# Patient Record
Sex: Female | Born: 1950 | Race: Black or African American | Hispanic: No | Marital: Single | Smoking: Former smoker
Health system: Southern US, Community
[De-identification: ages and names within clinical notes are randomized; demographics above are authoritative.]

## PROBLEM LIST (undated history)

## (undated) DIAGNOSIS — IMO0002 Reserved for concepts with insufficient information to code with codable children: Secondary | ICD-10-CM

## (undated) DIAGNOSIS — E118 Type 2 diabetes mellitus with unspecified complications: Secondary | ICD-10-CM

## (undated) DIAGNOSIS — J40 Bronchitis, not specified as acute or chronic: Secondary | ICD-10-CM

## (undated) DIAGNOSIS — J189 Pneumonia, unspecified organism: Secondary | ICD-10-CM

## (undated) DIAGNOSIS — I10 Essential (primary) hypertension: Secondary | ICD-10-CM

## (undated) DIAGNOSIS — R932 Abnormal findings on diagnostic imaging of liver and biliary tract: Secondary | ICD-10-CM

## (undated) DIAGNOSIS — I509 Heart failure, unspecified: Secondary | ICD-10-CM

## (undated) DIAGNOSIS — E1165 Type 2 diabetes mellitus with hyperglycemia: Secondary | ICD-10-CM

## (undated) HISTORY — DX: Type 2 diabetes mellitus with hyperglycemia: E11.65

## (undated) HISTORY — PX: CORONARY ARTERY BYPASS GRAFT: SHX141

## (undated) HISTORY — DX: Abnormal findings on diagnostic imaging of liver and biliary tract: R93.2

## (undated) HISTORY — PX: CARDIAC SURGERY: SHX584

## (undated) HISTORY — DX: Reserved for concepts with insufficient information to code with codable children: IMO0002

## (undated) HISTORY — DX: Type 2 diabetes mellitus with unspecified complications: E11.8

---

## 1998-01-10 ENCOUNTER — Emergency Department (HOSPITAL_COMMUNITY): Admission: EM | Admit: 1998-01-10 | Discharge: 1998-01-10 | Payer: Self-pay

## 1999-05-11 ENCOUNTER — Emergency Department (HOSPITAL_COMMUNITY): Admission: EM | Admit: 1999-05-11 | Discharge: 1999-05-11 | Payer: Self-pay | Admitting: Emergency Medicine

## 1999-08-12 ENCOUNTER — Encounter: Payer: Self-pay | Admitting: Family Medicine

## 1999-08-12 ENCOUNTER — Encounter: Admission: RE | Admit: 1999-08-12 | Discharge: 1999-08-12 | Payer: Self-pay | Admitting: Family Medicine

## 2000-09-15 ENCOUNTER — Encounter: Admission: RE | Admit: 2000-09-15 | Discharge: 2000-09-15 | Payer: Self-pay | Admitting: Family Medicine

## 2000-09-15 ENCOUNTER — Encounter: Payer: Self-pay | Admitting: Family Medicine

## 2002-04-17 ENCOUNTER — Encounter: Payer: Self-pay | Admitting: Emergency Medicine

## 2002-04-17 ENCOUNTER — Encounter: Admission: RE | Admit: 2002-04-17 | Discharge: 2002-04-17 | Payer: Self-pay | Admitting: Emergency Medicine

## 2003-03-18 ENCOUNTER — Encounter: Payer: Self-pay | Admitting: Cardiology

## 2003-03-18 ENCOUNTER — Inpatient Hospital Stay (HOSPITAL_COMMUNITY): Admission: EM | Admit: 2003-03-18 | Discharge: 2003-03-21 | Payer: Self-pay | Admitting: Cardiology

## 2003-03-18 ENCOUNTER — Encounter: Payer: Self-pay | Admitting: Emergency Medicine

## 2003-03-20 ENCOUNTER — Encounter: Payer: Self-pay | Admitting: Cardiology

## 2003-03-29 ENCOUNTER — Ambulatory Visit (HOSPITAL_COMMUNITY): Admission: RE | Admit: 2003-03-29 | Discharge: 2003-03-29 | Payer: Self-pay | Admitting: Cardiology

## 2003-03-29 ENCOUNTER — Encounter: Payer: Self-pay | Admitting: Cardiology

## 2003-06-27 ENCOUNTER — Encounter: Admission: RE | Admit: 2003-06-27 | Discharge: 2003-06-27 | Payer: Self-pay | Admitting: Cardiology

## 2004-04-15 ENCOUNTER — Encounter: Admission: RE | Admit: 2004-04-15 | Discharge: 2004-04-15 | Payer: Self-pay | Admitting: Internal Medicine

## 2005-01-30 ENCOUNTER — Inpatient Hospital Stay (HOSPITAL_COMMUNITY): Admission: AD | Admit: 2005-01-30 | Discharge: 2005-02-04 | Payer: Self-pay | Admitting: Cardiology

## 2005-01-30 ENCOUNTER — Encounter: Payer: Self-pay | Admitting: Emergency Medicine

## 2005-05-19 ENCOUNTER — Ambulatory Visit (HOSPITAL_COMMUNITY): Admission: RE | Admit: 2005-05-19 | Discharge: 2005-05-19 | Payer: Self-pay | Admitting: Cardiology

## 2005-06-22 ENCOUNTER — Encounter: Payer: Self-pay | Admitting: *Deleted

## 2005-06-23 ENCOUNTER — Inpatient Hospital Stay (HOSPITAL_COMMUNITY): Admission: EM | Admit: 2005-06-23 | Discharge: 2005-06-25 | Payer: Self-pay | Admitting: Cardiology

## 2005-06-24 ENCOUNTER — Encounter (INDEPENDENT_AMBULATORY_CARE_PROVIDER_SITE_OTHER): Payer: Self-pay | Admitting: Cardiology

## 2005-07-28 ENCOUNTER — Encounter: Payer: Self-pay | Admitting: *Deleted

## 2005-07-29 ENCOUNTER — Inpatient Hospital Stay (HOSPITAL_COMMUNITY): Admission: EM | Admit: 2005-07-29 | Discharge: 2005-07-30 | Payer: Self-pay | Admitting: Emergency Medicine

## 2005-11-08 ENCOUNTER — Emergency Department (HOSPITAL_COMMUNITY): Admission: EM | Admit: 2005-11-08 | Discharge: 2005-11-09 | Payer: Self-pay | Admitting: Emergency Medicine

## 2006-05-03 ENCOUNTER — Encounter: Admission: RE | Admit: 2006-05-03 | Discharge: 2006-05-03 | Payer: Self-pay | Admitting: Internal Medicine

## 2006-05-06 ENCOUNTER — Inpatient Hospital Stay (HOSPITAL_COMMUNITY): Admission: EM | Admit: 2006-05-06 | Discharge: 2006-05-17 | Payer: Self-pay | Admitting: Emergency Medicine

## 2006-05-10 ENCOUNTER — Encounter: Payer: Self-pay | Admitting: Vascular Surgery

## 2006-05-10 ENCOUNTER — Encounter (INDEPENDENT_AMBULATORY_CARE_PROVIDER_SITE_OTHER): Payer: Self-pay | Admitting: *Deleted

## 2006-06-10 ENCOUNTER — Encounter: Admission: RE | Admit: 2006-06-10 | Discharge: 2006-06-10 | Payer: Self-pay | Admitting: Cardiothoracic Surgery

## 2006-06-17 ENCOUNTER — Encounter (HOSPITAL_COMMUNITY): Admission: RE | Admit: 2006-06-17 | Discharge: 2006-09-15 | Payer: Self-pay | Admitting: Cardiology

## 2006-08-26 ENCOUNTER — Emergency Department (HOSPITAL_COMMUNITY): Admission: EM | Admit: 2006-08-26 | Discharge: 2006-08-26 | Payer: Self-pay | Admitting: Emergency Medicine

## 2006-09-08 ENCOUNTER — Emergency Department (HOSPITAL_COMMUNITY): Admission: EM | Admit: 2006-09-08 | Discharge: 2006-09-08 | Payer: Self-pay | Admitting: Emergency Medicine

## 2006-09-16 ENCOUNTER — Encounter (HOSPITAL_COMMUNITY): Admission: RE | Admit: 2006-09-16 | Discharge: 2006-12-15 | Payer: Self-pay | Admitting: Cardiology

## 2007-05-06 ENCOUNTER — Inpatient Hospital Stay (HOSPITAL_COMMUNITY): Admission: EM | Admit: 2007-05-06 | Discharge: 2007-05-10 | Payer: Self-pay | Admitting: Emergency Medicine

## 2007-05-06 ENCOUNTER — Encounter: Admission: RE | Admit: 2007-05-06 | Discharge: 2007-05-06 | Payer: Self-pay | Admitting: Internal Medicine

## 2007-05-09 ENCOUNTER — Encounter (INDEPENDENT_AMBULATORY_CARE_PROVIDER_SITE_OTHER): Payer: Self-pay | Admitting: Cardiology

## 2007-07-05 ENCOUNTER — Encounter: Admission: RE | Admit: 2007-07-05 | Discharge: 2007-07-05 | Payer: Self-pay | Admitting: Cardiology

## 2007-07-11 ENCOUNTER — Ambulatory Visit (HOSPITAL_COMMUNITY): Admission: RE | Admit: 2007-07-11 | Discharge: 2007-07-11 | Payer: Self-pay | Admitting: Cardiology

## 2007-07-20 ENCOUNTER — Encounter: Admission: RE | Admit: 2007-07-20 | Discharge: 2007-07-20 | Payer: Self-pay | Admitting: Thoracic Surgery

## 2007-07-20 ENCOUNTER — Ambulatory Visit: Payer: Self-pay | Admitting: Vascular Surgery

## 2008-01-26 ENCOUNTER — Encounter: Admission: RE | Admit: 2008-01-26 | Discharge: 2008-01-26 | Payer: Self-pay | Admitting: Cardiology

## 2008-02-07 ENCOUNTER — Ambulatory Visit (HOSPITAL_COMMUNITY): Admission: RE | Admit: 2008-02-07 | Discharge: 2008-02-08 | Payer: Self-pay | Admitting: Cardiology

## 2008-05-07 ENCOUNTER — Encounter: Admission: RE | Admit: 2008-05-07 | Discharge: 2008-05-07 | Payer: Self-pay | Admitting: Internal Medicine

## 2009-04-06 ENCOUNTER — Inpatient Hospital Stay (HOSPITAL_COMMUNITY): Admission: EM | Admit: 2009-04-06 | Discharge: 2009-04-07 | Payer: Self-pay | Admitting: Emergency Medicine

## 2009-06-07 ENCOUNTER — Encounter: Admission: RE | Admit: 2009-06-07 | Discharge: 2009-06-07 | Payer: Self-pay | Admitting: Internal Medicine

## 2009-10-06 ENCOUNTER — Emergency Department (HOSPITAL_COMMUNITY): Admission: EM | Admit: 2009-10-06 | Discharge: 2009-10-06 | Payer: Self-pay | Admitting: Emergency Medicine

## 2010-02-04 ENCOUNTER — Emergency Department (HOSPITAL_COMMUNITY): Admission: EM | Admit: 2010-02-04 | Discharge: 2010-02-05 | Payer: Self-pay | Admitting: Emergency Medicine

## 2010-02-24 ENCOUNTER — Encounter (INDEPENDENT_AMBULATORY_CARE_PROVIDER_SITE_OTHER): Payer: Self-pay | Admitting: Family Medicine

## 2010-02-24 ENCOUNTER — Ambulatory Visit: Payer: Self-pay | Admitting: Internal Medicine

## 2010-03-28 ENCOUNTER — Encounter (INDEPENDENT_AMBULATORY_CARE_PROVIDER_SITE_OTHER): Payer: Self-pay | Admitting: Family Medicine

## 2010-03-28 ENCOUNTER — Ambulatory Visit: Payer: Self-pay | Admitting: Internal Medicine

## 2010-03-28 LAB — CONVERTED CEMR LAB
Albumin: 4.5 g/dL (ref 3.5–5.2)
Alkaline Phosphatase: 68 units/L (ref 39–117)
BUN: 14 mg/dL (ref 6–23)
Calcium: 9.7 mg/dL (ref 8.4–10.5)
Cholesterol: 250 mg/dL — ABNORMAL HIGH (ref 0–200)
Glucose, Bld: 136 mg/dL — ABNORMAL HIGH (ref 70–99)
HDL: 32 mg/dL — ABNORMAL LOW (ref >39)
LDL Cholesterol: 178 mg/dL — ABNORMAL HIGH (ref 0–99)
Potassium: 4.2 meq/L (ref 3.5–5.3)
Triglycerides: 199 mg/dL — ABNORMAL HIGH (ref <150)

## 2010-07-15 ENCOUNTER — Ambulatory Visit (HOSPITAL_COMMUNITY)
Admission: RE | Admit: 2010-07-15 | Discharge: 2010-07-15 | Payer: Self-pay | Source: Home / Self Care | Admitting: Internal Medicine

## 2010-08-30 ENCOUNTER — Encounter: Payer: Self-pay | Admitting: Cardiology

## 2010-10-26 LAB — CBC
Hemoglobin: 12.1 g/dL (ref 12.0–15.0)
MCH: 30.4 pg (ref 26.0–34.0)
MCV: 90.5 fL (ref 78.0–100.0)
RBC: 3.99 MIL/uL (ref 3.87–5.11)

## 2010-10-26 LAB — POCT CARDIAC MARKERS
Myoglobin, poc: 78.1 ng/mL (ref 12–200)
Troponin i, poc: <0.05 ng/mL (ref 0.00–0.09)

## 2010-10-26 LAB — BASIC METABOLIC PANEL
CO2: 28 mEq/L (ref 19–32)
Chloride: 115 mEq/L — ABNORMAL HIGH (ref 96–112)
GFR calc Af Amer: 56 mL/min — ABNORMAL LOW (ref >60)
Potassium: 3.6 mEq/L (ref 3.5–5.1)
Sodium: 148 mEq/L — ABNORMAL HIGH (ref 135–145)

## 2010-10-26 LAB — DIFFERENTIAL
Eosinophils Absolute: 0.3 10*3/uL (ref 0.0–0.7)
Eosinophils Relative: 3 % (ref 0–5)
Lymphs Abs: 2.5 10*3/uL (ref 0.7–4.0)
Monocytes Relative: 10 % (ref 3–12)

## 2010-10-29 LAB — BASIC METABOLIC PANEL
Chloride: 106 mEq/L (ref 96–112)
Creatinine, Ser: 1.07 mg/dL (ref 0.4–1.2)
GFR calc Af Amer: >60 mL/min (ref >60)
GFR calc non Af Amer: 52 mL/min — ABNORMAL LOW (ref >60)
Potassium: 3.6 mEq/L (ref 3.5–5.1)

## 2010-10-29 LAB — DIFFERENTIAL
Eosinophils Absolute: 0.2 10*3/uL (ref 0.0–0.7)
Lymphocytes Relative: 19 % (ref 12–46)
Lymphs Abs: 1.5 10*3/uL (ref 0.7–4.0)
Monocytes Relative: 11 % (ref 3–12)
Neutrophils Relative %: 68 % (ref 43–77)

## 2010-10-29 LAB — BRAIN NATRIURETIC PEPTIDE: Pro B Natriuretic peptide (BNP): 409 pg/mL — ABNORMAL HIGH (ref 0.0–100.0)

## 2010-10-29 LAB — POCT CARDIAC MARKERS
CKMB, poc: 1.6 ng/mL (ref 1.0–8.0)
Myoglobin, poc: 144 ng/mL (ref 12–200)
Troponin i, poc: <0.05 ng/mL (ref 0.00–0.09)

## 2010-10-29 LAB — CBC
MCV: 89.2 fL (ref 78.0–100.0)
RBC: 4.08 MIL/uL (ref 3.87–5.11)
WBC: 8 10*3/uL (ref 4.0–10.5)

## 2010-11-15 LAB — CARDIAC PANEL(CRET KIN+CKTOT+MB+TROPI)
CK, MB: 2.1 ng/mL (ref 0.3–4.0)
CK, MB: 2.4 ng/mL (ref 0.3–4.0)
Relative Index: INVALID (ref 0.0–2.5)
Total CK: 77 U/L (ref 7–177)
Troponin I: 0.03 ng/mL (ref 0.00–0.06)

## 2010-11-15 LAB — DIFFERENTIAL
Basophils Absolute: 0 10*3/uL (ref 0.0–0.1)
Lymphocytes Relative: 21 % (ref 12–46)
Lymphs Abs: 1.7 10*3/uL (ref 0.7–4.0)
Neutro Abs: 5.2 10*3/uL (ref 1.7–7.7)
Neutrophils Relative %: 66 % (ref 43–77)

## 2010-11-15 LAB — BASIC METABOLIC PANEL
BUN: 15 mg/dL (ref 6–23)
CO2: 27 mEq/L (ref 19–32)
Calcium: 9.7 mg/dL (ref 8.4–10.5)
Chloride: 105 mEq/L (ref 96–112)
Creatinine, Ser: 0.94 mg/dL (ref 0.4–1.2)
GFR calc Af Amer: >60 mL/min (ref >60)
Glucose, Bld: 144 mg/dL — ABNORMAL HIGH (ref 70–99)

## 2010-11-15 LAB — BRAIN NATRIURETIC PEPTIDE
Pro B Natriuretic peptide (BNP): 148 pg/mL — ABNORMAL HIGH (ref 0.0–100.0)
Pro B Natriuretic peptide (BNP): 254 pg/mL — ABNORMAL HIGH (ref 0.0–100.0)

## 2010-11-15 LAB — POCT CARDIAC MARKERS
Myoglobin, poc: 66.5 ng/mL (ref 12–200)
Troponin i, poc: <0.05 ng/mL (ref 0.00–0.09)

## 2010-11-15 LAB — GLUCOSE, CAPILLARY
Glucose-Capillary: 157 mg/dL — ABNORMAL HIGH (ref 70–99)
Glucose-Capillary: 71 mg/dL (ref 70–99)

## 2010-11-15 LAB — TROPONIN I: Troponin I: 0.03 ng/mL (ref 0.00–0.06)

## 2010-11-15 LAB — D-DIMER, QUANTITATIVE: D-Dimer, Quant: 0.75 ug/mL-FEU — ABNORMAL HIGH (ref 0.00–0.48)

## 2010-11-15 LAB — CBC
HCT: 35.1 % — ABNORMAL LOW (ref 36.0–46.0)
MCHC: 33.3 g/dL (ref 30.0–36.0)
MCV: 90.3 fL (ref 78.0–100.0)
Platelets: 198 10*3/uL (ref 150–400)
Platelets: 215 10*3/uL (ref 150–400)
RDW: 14.1 % (ref 11.5–15.5)
RDW: 14.3 % (ref 11.5–15.5)
WBC: 7.9 10*3/uL (ref 4.0–10.5)

## 2010-11-15 LAB — POCT I-STAT, CHEM 8
BUN: 13 mg/dL (ref 6–23)
Calcium, Ion: 1.11 mmol/L — ABNORMAL LOW (ref 1.12–1.32)
Chloride: 107 mEq/L (ref 96–112)
HCT: 36 % (ref 36.0–46.0)
Sodium: 140 mEq/L (ref 135–145)

## 2010-11-15 LAB — CK TOTAL AND CKMB (NOT AT ARMC): Relative Index: INVALID (ref 0.0–2.5)

## 2010-12-23 NOTE — Cardiovascular Report (Signed)
NAME:  Rachel Oneill, Rachel Oneill               ACCOUNT NO.:  [id number]   MEDICAL RECORD NO.:  [id number]          PATIENT TYPE:  AMB   LOCATION:  SDS                          FACILITY:  [facility]   PHYSICIAN:  Vonna Kotyk R. Jacinto Halim, MD       DATE OF BIRTH:  Jan 15, 1951   DATE OF PROCEDURE:  07/11/2007  DATE OF DISCHARGE:                            CARDIAC CATHETERIZATION   PROCEDURE PERFORMED:  1. Abdominal aortogram.  2. Distal abdominal aortogram with bifemoral runoff.  3. Right femoral and right iliac arteriogram.  4. Closure of the arterial access on both sides using StarClose.   INDICATIONS:  [name] is a 60 year old female with known  coronary artery disease who has undergone bypass surgery in the past  along with mitral valve repair in October 2007.  She had 1-vessel bypass  graft to the right coronary artery.  She has had multiple episodes of  recurrent pulmonary edema.  In spite of mitral valve repair, I had been  unable to find out the etiology for pulmonary edema and she appears to  be compliant with both medications and diet.  She recently underwent  lower extremity arterial Dopplers which had revealed high-grade stenosis  of the right common iliac artery and a high-grade stenosis of the left  SFA.  Because of symptoms of claudication, I brought her to the  peripheral angiography suite for peripheral arterial disease diagnosis  and management.   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  There is swirling of the contrast  in spite of a wait time of approximately 4-5 minutes.  The right renal  artery was excluded from the aneurysm and arose superiorly.  However,  the left renal artery arises in the mid segment of the aneurysmal sac.   The aortoiliac bifurcation was patent.  However, the right common iliac  artery showed a high-grade 90-95%  stenosis at the bifurcation of the  internal and common iliac artery.  The internal iliac artery also had a  95-99% stenosis.   The left common iliac artery in the mid segment had a 40-50% stenosis  with a 15 mm pressure gradient.   Abdominal aortogram with bifemoral runoff:  The abdominal aortogram with  bifemoral runoff revealed the femoral arteries to be widely patent.  The  left SFA in the mid-to-distal segment had a 95% stenosis followed by a 2-  vessel runoff below the left knee.  The anterior tibial was occluded and  reconstitutes at the level of the ankle joint.   Right lower extremity:  Right SFA in the mid segment had a 40-50%  stenosis and there was 3-vessel runoff noted below the right knee.   IMPRESSION:  High-grade 90% stenosis of the right common iliac and  internal iliac artery stenosis at the bifurcation.  Moderate stenosis of  the left common iliac artery with a 15 mm pressure gradient.   A 95% stenosis of the left distal superficial femoral artery and a 50%  stenosis of the right  mid superficial femoral artery.  Three-vessel  runoff on the right and 2-vessel runoff on the left with occluded left  anterior tibial artery below the knee.   RECOMMENDATIONS:  I discussed the findings with Dr. Fabienne Bruns.  Given the fact that she has had recurrent pulmonary edema, I suspect  although there is no renal artery stenosis by cardiac catheterization on  selective engagement of the renal artery in September 2008 because of  recurrent pulmonary edema, I suspect the involvement of the renal artery  with the aneurysm could be causing her to have steal or underperfusion  of the renal arteries leading to physiologic renal artery stenosis.  Hence, she may need abdominal aneurysm repair along with reimplantation  of the renal arteries.   A formal CT scan of the abdomen will be performed to evaluate her  peripheral anatomy.   Approximately 140 mL of contrast was utilized for  diagnostic  angiography.   TECHNIQUE OF THE PROCEDURE:  Under usual sterile precautions, using a 5-  French left femoral arterial access I was able to advance a marker  pigtail catheter into the abdominal aorta and abdominal aortogram was  performed in the AP and also in the left lateral projection.  The distal  abdominal aortogram with distal bilateral femoral runoff was performed.  Then using the pigtail catheter, I was able to cross the [facility] wire  into the right common iliac artery and placed the wire into the common  femoral artery on the right.  Then I was able to obtain access through  the right groin using the wire as a support.  This was done with the  intention of angioplasty of the left SFA and also angioplasty of the  right common iliac artery.   I advanced a 7-French  Terumo sheath into the right common femoral  artery into the external iliac artery and angiography was performed.  The angiography in the left anterior oblique view revealed a high-grade  stenosis of the iliac arterial system at the bifurcation on the right.  Given this, I decided to stop the procedure and get surgical opinion.  Then I was able to close both the right and left groin access sites with  StarClose with excellent hemostasis.  The patient tolerated the  procedure.  No immediate complications were noted.      Cristy Hilts. Jacinto Halim, MD  Electronically Signed     JRG/MEDQ  D:  07/11/2007  T:  07/11/2007  Job:  [id number]   cc:   Gaspar Garbe, M.D.  Darrelyn Hillock

## 2010-12-23 NOTE — Discharge Summary (Signed)
NAME:  Rachel, Oneill NO.:  [id number]   MEDICAL RECORD NO.:  [id number]          PATIENT TYPE:  INP   LOCATION:  4705                         FACILITY:  [facility]   PHYSICIAN:  Gaspar Garbe, M.D.DATE OF BIRTH:  08-13-1950   DATE OF ADMISSION:  04/05/2009  DATE OF DISCHARGE:  04/07/2009                               DISCHARGE SUMMARY   FINAL DIAGNOSES:  1. Congestive heart failure, mild.  2. Chronic obstructive pulmonary disease exacerbation.  3. Coronary artery disease.  4. Peripheral vascular disease.  5. Hypertension.  6. Hyperlipidemia.  7. Nodules, subcentimeter in chest.  Prior CAT scan of chest with      lymphadenopathy, which has remained unchanged.   MEDICATIONS ON DISCHARGE:  1. Aspirin 81 mg per day.  2. Plavix 75 mg per day.  3. Lipitor 80 mg per day.  4. Zetia 10 mg per day.  5. Prinivil 40 mg once daily.  6. Lasix 40 mg once daily.  7. Glipizide 5 mg once daily.  8. Flovent HFA 110, 2 puffs twice daily.  9. Albuterol HFA 2 puffs q.6 h. p.r.n. shortness of breath.   RADIOLOGY:  1. Chest x-ray performed on April 07, 2009, showed resolving      pulmonary edema.  2. CT angio and chest on April 06, 2009, shows no pulmonary emboli,      acute congestive heart failure, and interval multiple bilateral      noncalcified lung nodules, which could represent noncalcified      granuloma or lung metastasis, stable mediastinal bilateral hilar      adenopathies noted as well as cardiomegaly.   LABORATORY TESTS:  BNP on date of discharge 148, was 254 on admission.  BMET shows sodium 141, potassium 3.5, BUN and creatinine 15 and 0.9  respectively.  CBC shows white count 8.3, hemoglobin 12.4, hematocrit  37.4, platelets 215.  Cardiac enzymes remained negative.  D-dimer was  slightly elevated at admission at 0.75.   HOSPITAL COURSE:  The patient was admitted through the emergency room  for shortness of breath.  She had undergone workup, which was  negative  for PE and evidence of acute CHF with a slightly elevated BNP.  The  patient had Lasix ordered and responded reasonably well to this.  The  patient does have a long-term tobacco usage history, and she was  initiated on Flovent as well.  By the date of discharge, she felt like  she was considerably better.  Consultation was undertaken by Dr. Tresa Endo,  who took her off her Prinzide and put her on plain Prinivil at higher  dose of 40 mg and Lasix 40 mg a day.  The patient is being discharged  home in good condition.   FOLLOWUP:  The patient is to follow up with Dr. Tresa Endo with appointment,  which she believes is a standing appointment on April 18, 2009.  He  will consider echocardiogram as an outpatient, as this could not [facility]  [facility].  She will follow up with me per  her routine.   PHYSICAL EXAMINATION  ON THE DATE OF DISCHARGE:  VITAL SIGNS:  Temperature 97.6, pulse 69, respiratory rate 16, blood pressure 113/62,  sating 92% on room air.  GENERAL:  No acute distress.  HEENT:  Normocephalic, atraumatic.  PERRLA.  EOMI.  ENT is within normal  limits.  NECK:  Supple.  No lymphadenopathy, JVD, or bruit.  CARDIAC:  Regular rate and rhythm.  No murmur, rub, or gallop.  LUNGS:  Clear to auscultation bilaterally.  ABDOMEN:  Soft, nontender, normoactive bowel sounds.  EXTREMITIES:  No clubbing, cyanosis, or edema.   CONDITION ON DISCHARGE:  Stable.      Gaspar Garbe, M.D.  Electronically Signed     RWT/MEDQ  D:  04/07/2009  T:  04/07/2009  Job:  [id number]   cc:   Nicki Gu[place], M.D.

## 2010-12-23 NOTE — Cardiovascular Report (Signed)
NAME:  Rachel Oneill, Rachel Oneill NO.:  [id number]   MEDICAL RECORD NO.:  [id number]          PATIENT TYPE:  OIB   LOCATION:  2626                         FACILITY:  [facility]   PHYSICIAN:  Cristy Hilts. Jacinto Halim, MD       DATE OF BIRTH:  03/10/1951   DATE OF PROCEDURE:  02/07/2008  DATE OF DISCHARGE:                            CARDIAC CATHETERIZATION   PROCEDURES PERFORMED:  1. Abdominal aortogram.  2. Pelvic arteriogram.  3. Right and left iliac arteriogram.  4. Abdominal aortogram with bifemoral runoff.  5. Stenting of the right common iliac artery and external iliac artery      bifurcation.  6. Stenting of the left common and external iliac artery bifurcation.   INDICATIONS:  Rachel Oneill is a 60 year old African American female  with known coronary disease and peripheral arterial disease.  She has  been complaining of lifestyle-limiting claudication.  She had abnormal  Dopplers revealing high-grade stenosis of the right common iliac artery  and also high-grade stenosis of the left superficial femoral artery.  Hence, she was brought to the catheterization lab for evaluation of her  peripheral anatomy.  She also has a known abdominal aortic aneurysm  which is small to moderate-sized and has been closely watched.  The ABI  on the right was 0.79 and left was 0.78.  There was also a 50% stenosis  noted in the left common iliac artery with Dopplers and high-grade  stenosis of the right common iliac artery.   Abdominal aortogram:  Abdominal aortogram revealed presence of small to  moderate-sized abdominal aortic aneurysm.  This involved the left renal  artery.  By historically, it measures 3.3 cm x 3.2 cm.  This is  unchanged from prior catheterizations.   The aortoiliac bifurcation was patent widely, however, there was a  suggestion of high-grade stenosis of the left common iliac artery and  also right common iliac artery at the bifurcation of the internal and  external iliac  artery.   Iliac arteriogram:  Iliac arteriogram did confirm very high-grade  stenosis of the right iliac artery stenosis and moderate stenosis of the  left common iliac artery.   The bilateral iliac arteriogram was also performed.   The abdominal aortogram with bifemoral runoff revealed a high-grade 90%  stenosis of the left superficial femoral artery.   Below the left knee, the anterior tibial artery was occluded and is  collateralized by the other 2 vessels at the level of the ankle.   On the right side, there is a 50% stenosis of the right superficial  femoral artery, but otherwise 3-vessel runoff is noted below the right  knee.  The anterior tibial artery showed tandem 90% stenosis, however,  there was brisk flow noted.   INTERVENTION DATA:  Extremely difficult to access and also difficult to  navigate through the right common iliac artery stenosis.  Because of  nonpalpable pulses, ultrasound-guided access was obtained.  Successful  PTA and stenting of the right common external iliac artery stenosis with  implantation of a 10.0 x 40-mm EV3 Protege stent which was postdilated  with  a 6.0 x 20-mm FoxCross balloon.  Overall, the stenosis was reduced  from 95% to 0% with brisk flow.  The internal iliac artery which has a  ostial 70% stenosis maintained its patency.   Successful PTA and stenting of the left common iliac artery protruding  into the left external iliac artery with implantation of a 10.0 x 30 mm  Protege stent and postdilated with 8.0 x 20-mm Powerflex balloon.  Stenosis was reduced from 80% to 0% with brisk flow noted.   RECOMMENDATIONS:  Given the fact that she has high-grade stenosis of the  left superficial femoral artery, she will need elective PTA of the left  superficial femoral artery.  This will be performed in 6-8 weeks  following this procedure, so that the stents can be endothelialized.  As  far as the abdominal aortic aneurysm is concerned, this has  remained  stable.  We will continue to monitor this by ultrasound as an outpatient  basis.   A total of 160-175 mL of contrast was utilized for diagnostic and  interventional procedure.   TECHNICAL PROCEDURE:  Using ultrasound guidance, 5-French right femoral  arterial access was obtained with great difficulty.  Right iliac  arteriogram was performed.  This revealed a significant high-grade  stenosis of the left common iliac artery.  Hence, a left groin access  was also obtained.   A 5-French sheath was introduced into the left common femoral artery.   Left iliac arteriogram was performed.   A 5-French Omniflush catheter was advanced into the abdominal aorta and  abdominal aortogram was performed.  The catheter was pulled back into  the distal abdominal and pelvic aortogram was performed.  Then abdominal  aortogram with bifemoral runoff was also performed.   TECHNIQUE OF INTERVENTION:  Using heparin for anticoagulation  maintaining ACT greater than 200, a 7-French Terumo bright tip sheath  was advanced up to the external iliac artery.  Using a H1 glide tip  catheter, I was able to advance through the common iliac artery stenosis  with great difficulty using a Glidewire and I was able to cross through  this and placed the wire into the abdominal aorta and the Headhunter was  placed through the distal abdominal aorta.  The Glidewire was removed  and a Wholey wire was placed into the abdominal aorta.  Then using a 6.0  x 20 mm balloon, balloon angioplasty was performed.  There was  dissection noted in the common iliac artery and also patency of the  external iliac artery was maintained.  Prior to performing balloon  angioplasty, the Omniflush balloon was utilized to place a Wholey wire  into the right internal iliac artery crossing it from the left side and  placed into the internal iliac artery to maintain the patency.  Having  performed balloon angioplasty and dissection noted into  the common iliac  artery, I decided to place a stent and a 10.0 x 40 mm Prodigy stent was  placed and deployed and postdilated the same with 6.0 x 20 mm FoxCross  balloon.  Excellent results were noted.   The attention was directed towards the left common iliac artery  stenosis.  I performed a careful pullback after giving 200 mcg of intra-  arterial nitroglycerin and this revealed about a 20-25 mm pressure  gradient across the stenosis.  Hence, I proceeded with stenting of the  same with a 10.0 x 30 mm self-expanding Prodigy stent which was  appropriately deployed at the ostium of  the left common iliac artery and  covering the stenosis.  This was postdilated with a 8.0 x 20 mm  Powerflex balloon at 6 atmospheric pressure for 30 seconds and again at  2 atmospheric pressure for 15 seconds and performed this angiography.  It revealed excellent results.  The Omniflush catheter was pushed  back into the distal abdominal aorta and distal abdominal aortogram was  performed and results were confirmed.  The catheters were pulled out  over the soft wire and sheaths were sutured in place.  The patient  tolerated the procedure well.  No immediate complications were noted.      Cristy Hilts. Jacinto Halim, MD  Electronically Signed     JRG/MEDQ  D:  02/07/2008  T:  02/08/2008  Job:  [id number]   cc:   Gaspar Garbe, M.D.

## 2010-12-23 NOTE — Discharge Summary (Signed)
NAME:  Rachel Oneill, ABER NO.:  [id number]   MEDICAL RECORD NO.:  [id number]          PATIENT TYPE:  INP   LOCATION:  4739                         FACILITY:  [facility]   PHYSICIAN:  Cristy Hilts. Jacinto Halim, MD       DATE OF BIRTH:  [birth year]   DATE OF ADMISSION:  05/06/2007  DATE OF DISCHARGE:  05/10/2007                               DISCHARGE SUMMARY   FINAL DISCHARGE DIAGNOSES:  1. Non-ST elevation myocardial infarction.  2. Known coronary artery disease status post coronary artery bypass      graft surgery (CABG) as well as mitral valve repair.  3. Cardiac catheterization this admission revealing patent sepsis vein      graft to the right coronary artery as well as patent graft to the      circumflex obtuse marginal.  4. Mildly decreased left ventricular (LV) function with ejection      fraction of 40%.  5. Hypertension.  6. Congestive heart failure, acute on chronic.  7. Dyslipidemia.  8. Abnormal electrocardiogram .   PROCEDURE:  Left heart catheterization and coronary arteriogram  performed on May 09, 2007, by Dr. Susa Griffins revealing a  patent sepsis vein graft to obtuse marginal branch as well as patent  saphenous vein graft to the PDA.  Ejection fraction was estimated at 35-  40% without mitral regurgitation.  There were no complications during  the cardiac cath.   BRIEF HISTORY:  Rachel Oneill is a pleasant 60 year old African American  female with a history of known coronary artery disease status post CABG  as well as mitral valve repair with mildly decreased LV function by echo  in March 2008.  She has a history of recurrent pulmonary edema secondary  to medication noncompliance as well as dietary noncompliance.  She  presented to [facility] emergency department with shortness of breath.  On arrival, blood pressure was 214/125.  On further questioning, she had  eaten [place] food earlier in the day as well as fried chicken earlier  throughout the  week.  Please see written H&P for further details.   HOSPITAL COURSE:  Ms. Finnie was admitted to telemetry in stable  condition.  Her blood pressure had improved down to 138/86.  Cardiac  markers were drawn and initially returned a CK of 128, CK-MB of 4.5 and  troponin of 0.08.  D-dimer was also abnormal at 1.0.  On admission, her  EKG revealed normal sinus rhythm, sinus tach with nonspecific ST-T wave  changes.  However, once the patient arrived to the floor, EKG was  repeated, and there were clearly new T-wave inversions in 1, AVL and V6  with no ST elevation.  Heparin was initiated, and a CT of the chest was  ordered to evaluate for PE, and she was transferred to step down unit.  CT of the chest revealed no evidence for pulmonary embolism.  There was  enlargement of the left main pulmonary artery stenosis of underlying  pulmonary hypertension and no other acute findings.  Has a very abnormal  EKG which shows positive cardiac markers.  She underwent  left heart  catheterization and coronary arteriogram on May 09, 2007 with  findings as listed above.  She was transferred to telemetry post  procedure, and on the day of discharge, she was doing quite well and was  anxious for discharge home.  She was ambulating in the hallway without  difficulty, and her congestive heart failure had resolved.  Her lungs  were clear on physical exam.  There were no murmurs or gallops, and the  right groin site was stable without hematoma or bleeding.  Her blood  pressure was 93/61.  She was deemed ready for discharge.   DISCHARGE INSTRUCTIONS:  1. Diet: She is to follow a heart healthy low-fat, low-sodium diet.      She is to weigh herself daily and contact us with a 3 pound weight      gain in 24 hours or 5 in one week.  2. Wound care: She is to keep her groin site clean and dry and to      contact our office with any redness, swelling or drainage from the      site.  3. Activity: She may increase  her activity slowly.  She may shower      pain.  She is to avoid heavy lifting for the next week and no      driving for the next 24 hours.  4. Follow up: She is to follow up with Dr. Jacinto Halim.  Our office will      contact her with dates and times for her appointment.   DISCHARGE MEDICATIONS:  1. Plavix 75 mg daily.  2. Aspirin 81 mg daily.  3. Lipitor 80 mg daily.  4. Lisinopril/hydrochlorothiazide 20/12.5 mg daily.  5. Coreg 6.25 mg b.i.d.  6. Norvasc 5 mg daily.  7. She is to discontinue her metoprolol.     ______________________________  Charmian Muff, NP      Cristy Hilts. Jacinto Halim, MD  Electronically Signed    LS/MEDQ  D:  07/02/2007  T:  07/02/2007  Job:  [id number]   cc:   [facility]

## 2010-12-23 NOTE — H&P (Signed)
NAME:  Rachel Oneill, WOODRING NO.:  [id number]   MEDICAL RECORD NO.:  [id number]          PATIENT TYPE:  INP   LOCATION:  4705                         FACILITY:  [facility]   PHYSICIAN:  Gaspar Garbe, M.D.DATE OF BIRTH:  12-15-1950   DATE OF ADMISSION:  04/05/2009  DATE OF DISCHARGE:                              HISTORY & PHYSICAL   CHIEF COMPLAINT:  Shortness of breath.   HISTORY OF PRESENT ILLNESS:  The patient is a 60 year old black female  with a history of coronary artery disease, mitral valve repair, and a  long-term smoking history from which she has quit about 4 years ago.  She also has peripheral vascular disease with common iliac stent.  The  patient indicated that she felt like she was catching a cold on Monday,  took Crocin and did reasonably well, feeling better by Wednesday, by  Thursday she was having more shortness of breath and by Friday, the day  prior to her admission she was having some difficulty in getting around  without getting short of breath and could not go to the laundromat  because it was too difficult for her to walk the distance.  She then  came to the emergency room where she was evaluated, undergoing spiral CT  to rule out PE because of a mildly elevated D-dimer, cardiac enzymes  which were negative, and chest x-ray and EKG which showed possibility of  pulmonary vascular congestion.  I was asked to admit the patient to the  hospital.  Of note, the patient has responded reasonably well to IV  Lasix as ordered.   ALLERGIES:  No known drug allergies.   MEDICATIONS:  1. Aspirin 81 mg per day.  2. Plavix 75 mg per day.  3. Lipitor 80 mg per day.  4. Zetia 10 mg per day.  5. Prinzide 20/12.5 mg per day.  6. Glipizide 5 mg per day.   PAST MEDICAL HISTORY:  1. Coronary artery disease status post CABG and mitral valve repair in      2008.  2. Diabetes mellitus type 2, well controlled.  3. Peripheral vascular disease status post common  iliac stents.  4. History of pulmonary edema and CHF with ejection fraction known of      35% to 40%.  5. History of a small AAA.  6. Hypertension.  7. Hyperlipidemia.   PAST SURGICAL HISTORY:  As above.   SOCIAL HISTORY:  The patient lives in [place] with her husband.  She  is a collections agent.  Married with 3 children.  She is 20-pack-year  smoking history and quit in about 2006, does not drink alcohol.   FAMILY HISTORY:  Noncontributory.   REVIEW OF SYSTEMS:  The patient denies any fevers, chills, or sweats.  Indicates that she has had some nasal discharge previously and notes  shortness of breath, dyspnea on exertion, and cough and has also  gurgling in her lungs.  Denies any urinary system.  Denies any chest  pain.  Denies any musculoskeletal symptoms or GI symptoms.  Fourteen-  point review of systems is otherwise negative.  PHYSICAL EXAMINATION:  VITAL SIGNS:  Temperature 98, pulse 95,  respiratory rate 15, blood pressure 133/84, sating 95% on room air.  GENERAL:  No acute distress.  HEENT:  Normocephalic, atraumatic.  PERRLA.  EOMI.  ENT is within normal  limits.  NECK:  Supple.  No lymphadenopathy, JVD, or bruit.  HEART:  Regular rate and rhythm.  LUNGS:  The patient has slight inspiratory crackles.  No expiratory  wheezes are noted.  ABDOMEN:  Soft, nontender, normoactive bowel sounds.  No  hepatosplenomegaly.  EXTREMITIES:  No clubbing, cyanosis, or edema are noted.  MUSCULOSKELETAL:  No joint deformities.  NEURO:  The patient is oriented to person, place, and time and has 5/5  strength.   Chest x-ray shows pulmonary vascular congestion, but no consolidation.  CT shows negative for pulmonary embolism, positive for congestive heart  failure with hilar lymphadenopathy, and multiple noncalcified lung  nodules that appear new as compared to CT from 2 years ago.  This needs  to be followed up in 3 months.   LABORATORY DATA:  White count 7.26, hemoglobin 11.7,  hematocrit 35.1,  platelets 198.  BUN and creatinine 13 and 0.9 respectively.  CK 95, MD  3.1, troponin less than 0.05.  D-dimer was slightly elevated at 0.75 and  her BNP is 254.   ASSESSMENT/PLAN:  1. Shortness of breath.  This is probably a mixed picture of      congestive heart failure, possibly any type of COPD or respiratory      illness.  For congestive heart failure, I have added 40 mg of IV      Lasix.  We will follow her BMPs and check an echo in the morning.      I have asked Dr. Tresa Endo with [facility] to see      her.  I believe she is to see Dr. Jacinto Halim in the past, I am not      certain who she is assigned to at this point.  2. Chronic obstructive pulmonary disease.  Her longstanding      respiratory history, this is likely concomitant issue.  We will      start her on inhaled corticosteroids and Xopenex nebs as needed.      She will most likely go home with albuterol nebulizers and inhaled      corticosteroid with outpatient PFTs at a later date as these are      difficult to obtain on the weekend.  I will also cover her with IV      Rocephin while she is in-house.  Given her multiple concomitant      illnesses, respiratory, and cardiovascular disease, she is at high      risk for exacerbation.  3. Hypertension, currently controlled.  4. Hyperlipidemia, max dose of Lipitor plus Zetia.  5. Coronary artery disease.  We will rule her out by enzymes as this      is most likely not myocardial infarction.  6. Pulmonary nodules.  These are subcentimeter, will need to be      followed in 3 months by CT and could be a sequela of her current      illness.  The lymphadenopathy, which have been noted 2 years ago      has not changed in size per radiology's read.      Gaspar Garbe, M.D.  Electronically Signed     RWT/MEDQ  D:  04/06/2009  T:  04/06/2009  Job:  [id number]

## 2010-12-23 NOTE — Discharge Summary (Signed)
NAME:  Rachel Oneill, EVOLA NO.:  [id number]   MEDICAL RECORD NO.:  [id number]          PATIENT TYPE:  OIB   LOCATION:  2626                         FACILITY:  [facility]   PHYSICIAN:  Cristy Hilts. Jacinto Halim, MD       DATE OF BIRTH:  July 22, 1951   DATE OF ADMISSION:  02/07/2008  DATE OF DISCHARGE:  02/08/2008                               DISCHARGE SUMMARY   DISCHARGE DIAGNOSES:  1. Peripheral vascular disease, status post bilateral common iliac      arteries intervention during this admission by Dr. Jacinto Halim.  2. Recurrent pulmonary edema, currently stable.  3. Known coronary artery disease, status post coronary artery bypass      graft in 2007 with saphenous vein graft to the posterior descending      artery and saphenous vein graft to the obtuse marginal 1.  4. Status post mitral valve repair at the time of open heart surgery.  5. Known abdominal aortic aneurysm, small.   This is a 60 year old female, patient of Dr. Jacinto Halim, who was seen in the  office with complaints of hip claudication.  The patient underwent  peripheral vascular Dopplers and ultrasound in our office, which showed  abnormal resolved and her further evaluation of this problem; the  patient was scheduled for peripheral vascular angiogram.  She presented  to [facility] on February 07, 2008, for this procedure and  underwent the same day.  Dr. Jacinto Halim has performed angiography and the  results revealed bilateral common iliac artery stenosis with bilateral  SFA stenosis to both left greater than the right.  She also had small-to-  moderate size abdominal aortic aneurysm with patent renal arteries.   Dr. Jacinto Halim performed stenting of bilateral common iliacs with good result  and the patient will have to return for stage elective SFA angioplasty.   The next morning, she was seen by Dr. Alanda Amass, considered to be stable  for discharge home.  Her laboratories were stable.  Sodium 139,  potassium 3.7, BUN 10, and  creatinine 0.92.  Hemoglobin 11.6 and  hematocrit 33.9.   The patient will be discharged home on the following medications.  1. Plavix 75 mg daily.  2. Lopressor 25 mg b.i.d.  3. Lisinopril/hydrochlorothiazide 20/12.5 mg daily.  4. Aspirin 81 mg daily.  5. Lipitor 80 mg daily.  6. Zetia 10 mg daily.  7. Glipizide 2.5 mg daily.   DISCHARGE INSTRUCTIONS:  The patient was instructed not to drive or lift  weight greater than 5 pounds for 3 days post cath.  Increase activity  slowly.  If worsening in signs of oozing or bleeding from groin puncture  site, call our office.   DISCHARGE FOLLOWUP:  Office will contact the patient to schedule  ultrasound of lower extremity, sent followup appointment with Dr. Jacinto Halim  within 2 weeks.      Raymon Mutton, P.A.       ______________________________  Cristy Hilts Jacinto Halim, MD    MK/MEDQ  D:  02/08/2008  T:  02/08/2008  Job:  [id number]   cc:   Gaspar Garbe, M.D.

## 2010-12-23 NOTE — Cardiovascular Report (Signed)
NAME:  Rachel Oneill, Rachel Oneill               ACCOUNT NO.:  [id number]   MEDICAL RECORD NO.:  [id number]          PATIENT TYPE:  INP   LOCATION:  4739                         FACILITY:  [facility]   PHYSICIAN:  Richard A. Alanda Amass, M.D.DATE OF BIRTH:  [birth year]   DATE OF PROCEDURE:  05/09/2007  DATE OF DISCHARGE:                            CARDIAC CATHETERIZATION   PROCEDURE:  Retrograde central aortic catheterization, selective  coronary angiography via Judkins technique, saphenous vein graft  angiography, LV angiogram RAO, LAO projection, abdominal aortic  angiogram, midstream PA projection, suprarenal and second bilateral  iliac angiogram, distal AA.   PROCEDURE:  The patient was brought to second floor CP lab in a post  absorptive state after 5 mg valium p.o. premedication.  She was given 2  mg Versed IV for sedation, and 1% Xylocaine was used for local  anesthesia.  The right groin was prepped and draped in the usual manner.  The LCFA was entered with a single anterior puncture using an 18 thin-  wall needle and Teflon-coated J-tip guide wire with guidewire exchange  throughout the procedure.  A 6-French short sidearm sheath was inserted.  Coronary angiography was done with 6-French 4-cm taper preformed  coronary catheters.  Left SVG to the OM was done with the right coronary  catheter and right SVG to the PDA was done with a multipurpose A2  catheter.  LV angiogram was done in the RAO and LAO projection at 25 mL  14 mL per second and 20 mL 12 mL per second.  Pullback pressure CA was  recorded with no gradient across the aortic valve.  Abdominal aortic  angiogram was done at 25 mL 20 mL per second above the level of the  mesenteric vessels because of AAA involving at LRA and possibly the  mesenteric vessels.  A Second injection was done above the iliac  bifurcation because of known bilateral iliac disease, right greater than  left at 20 mL 20 mL per second.  Selective left renal angiogram  was done  by hand with the right coronary catheter.  Catheters were removed.  Side-  arm sheath was flushed.  There was extensive iliac calcification  including external iliacs, so no closure device was employed.  The  patient was transferred to holding area for sheath removal and pressure  hemostasis in stable condition.  She tolerated the procedure well.   PRESSURES:  LV:  140/0; LVEDP 18 - 24 - 26 mmHg.  CA:  140/75 mmHg.  There was no gradient across the aortic valve on  catheter pullback.   Fluoroscopy showed the previously placed proximal and tandem mid stents  of right coronary stents and proximal left circumflex stent was 2+ right  coronary calcification and +1 left coronary calcification.  No  significant intracardiac or valvular calcification.  The patient's  annuloplasty ring was well visualized fluoroscopically.   LV angiogram in the RAO projection showed hypotension akinesis of the  inferior wall one half to two thirds.  In the LAO projection, there was  hypokinesis of the mid posterior apical segment.  There was no  significant MR on  either projection, and estimated EF was approximately  40%, quantification is pending.   The main left coronary artery was short but appeared to be normal.   The left anterior descending artery arose normally with no significant  stenosis proximally.  There was smooth 30% narrowing at the junction of  the proximal and mid third with good residual lumen and large LAD that  coursed the apex of the heart and bifurcated.  There was a moderately  large first diagonal branch from the proximal LAD with no significant  stenosis, and there was small diagonal from the mid and distal third of  the LAD.  The LAD and its branches had no significant stenosis with good  flow.   The ostial circumflex had a previously placed stent which was well  visualized with no restenosis and there was less than 20% narrowing  within the stent.  Beyond this, there  was a large bifurcating second  marginal branch that had no significant stenosis and a small OM III that  was normal and a distal circumflex that was large and normal with a  normal PABG branch that trifurcated.   The native right coronary artery had visualized stents throughout the  proximal third and midportion.  Because of calcification and stents, it  was difficult to tell.  However, past angiograms would indicate that  there was probably multiple overlapping stents with no space between.  There was 95% stenosis at the junction of the proximal third ISR of the  RCA and another 95% stenosis of the midportion of the RCA ISR.  There  was filling of the PDA and PLA and retrograde filling of the SVG to the  PDA which filled well and native right coronary injection.   Saphenous vein graft to the OM1 was widely patent with an excellent  anastomosis, good filling of a moderately large bifurcating OM1.   Saphenous vein graft to the PDA was widely patent with approximately 40%  narrowing in the junction of the proximal third, excellent anastomosis  to the PDA and retrograde filling of the PLA.   DISCUSSION:  This 60 year old Afro American, mother of four with 12  grandchildren is a remote smoker.  She has coronary disease with prior  SEPMI and SEDMI.  She has previous proximal circumflex stenting August  2004 and RCA stenting at that time as well.  She then required a second  circumflex ostial stent in November 2006 and underwent three overlapping  tandem stents in the mid RCA in November 2006.  She has had prior mid  RCA stenting in June 2006 after SEDMI.   She had an EF of 35% at that time and a known AAA   She had recurrent admissions for pulmonary edema and underwent  recatheterization May 07, 2006, and had high-grade in-stent  restenosis of her proximal and mid RCA stents, noncritical coronary  disease and a patent circumflex stent.  Because of moderately severe to  severe  mitral regurgitation, and in-stent restenosis, she subsequently  underwent surgical revascularization by Dr. Jaynie Collins with SVG to the PDA,  SVG to OM and a 26 mm MV annuloplasty ring because of failure of good  coaptation of P3A3, felt to be predominantly annular, so repair was not  required.   She has had medical compliance issues in the past with recurrent CHF  symptoms of moderately acute __________ chest pain __________ no  significant enzyme elevation, however, she did have EKG changes with  significant diffuse widespread T-wave inversions, prompting  referral for  recatheterization.   At recatheterization today, estimated EF in RAO projection was about 35-  40% and approximately 40% in the LAO projection with no significant MR.  A graft to the OM1 and a graft to the PDA are patent.  There is no  significant MR.   Her native circumflex stent is patent, and she has an infrarenal  abdominal aortic aneurysm with a very short neck that begins below the  right renal artery and involves the ostium of the left renal artery.  The left renal artery is patent on selective injection and single and  the right renal artery is single and widely patent.  Further angiography  of the iliacs demonstrates 90-95%, RCA stenosis and no significant LCIA  stenosis.  Profunda.  SFA junctions are intact bilaterally.   The etiology of her T-wave changes are not clear.  She has noncritical  LAD disease, patent circumflex stent and patent grafts as outlined  above.  I would recommend continued medical therapy for her LV  dysfunction.   CATHETERIZATION DIAGNOSES:  1. ASHD.      a.     Prior SEPMI with proximal circumflex stenting in August       2004, ostial circumflex stenting November 2006.      b.     Prior SEDMI with mid right coronary stenting June 2006 and       then triple tandem overlapping stents for progression of disease       November 2006 (DES)      c.     ISR proximal mid RCA with patent  circumflex stent and LV       dysfunction, EF approximately 45% September 2007.  2. Status post CABG, Dr. Jaynie Collins, SVG to OM1 and SVG to distal right      with 26 mm annuloplasty ring for moderately severe - severe MR      May 12, 2006.  3. Recurrent CHF, dietary indiscretion  4. Widespread T-wave inversion without chest pain on this admission      with CHF elevated BMP.  5. Asymptomatic peri-left renal AAA  6. 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  7. No significant left common iliac stenosis.  8. LV dysfunction, EF 35-40%, no significant MR in this study.  9. Patent SVG to OM, patent SVG to PDA on this study.      Richard A. Alanda Amass, M.D.  Electronically Signed     RAW/MEDQ  D:  05/09/2007  T:  05/10/2007  Job:  [id number]   cc:   Cristy Hilts. Jacinto Halim, MD  Gaspar Garbe, M.D.  [facility]

## 2010-12-23 NOTE — Assessment & Plan Note (Signed)
OFFICE VISIT   Rachel Oneill, Rachel Oneill  DOB:  14-Jan-1951                                       07/20/2007  EAVWU#:[id number]   The patient is a 60 year old female who is referred by Dr. Yates Decamp for  evaluation for renal artery stenosis.  She has a history of coronary  artery disease and underwent coronary artery bypass grafting and mitral  valve repair in October of 2007.  She apparently had several episodes of  pulmonary edema, the last one occurred in September of 2007.  She is  being followed by Dr. Jacinto Halim for claudication symptoms and her pulmonary  edema.  She previously had cardiac catheterization with Dr. Alanda Amass  several months ago, which showed no obvious cardiac explanation for her  pulmonary edema.  She did have some suggestion of left renal artery  stenosis, but according to Dr. Jacinto Halim, there was no gradient across the  renal artery.  In discussions with the patient, she states that since  she has had better control of her blood pressure and decreased her  sodium intake, she has not had episodes of pulmonary edema.  She states,  again, that her last episode was in September.  She denies chest pain.  She has had no prior history of renal dysfunction.  She denies history  of diabetes, but does have history of hypertension, elevated  cholesterol, and coronary artery disease with myocardial infarction in  October of 2007.   FAMILY HISTORY:  Unremarkable.   SOCIAL HISTORY:  She is married, has 3 children, works as a IT consultant.  She is a former smoker and quit 2 years ago.  She does not  consume alcohol regularly.   PAST SURGICAL HISTORY:  As mentioned above.   REVIEW OF SYSTEMS:  She has no recent chest pain, weight gain, or loss.  She is not currently having symptoms of asthma, wheezing, or shortness  of breath.  GI, GU, neurological, orthopedic, psychiatric, ENT, and hematologic  review of systems were discussed with the patient and are  all negative.  She does have some pain in her legs with walking consistent with  claudication and has been seen by Dr. Jacinto Halim and found to have evidence  of iliac artery stenosis, as well as superficial femoral artery  stenosis.  She denies rest pain.   I reviewed her arteriogram from Dr. Alanda Amass in September of 2008, as  well as the arteriogram performed by Dr. Jacinto Halim in December 2008.  She  has a small infrarenal abdominal aortic aneurysm with evidence of right  common iliac artery stenosis.  The left and right renal arteries do  fill, and there is no obvious anatomic evidence of renal artery  stenosis.  She additional had a CT angiogram of the abdomen and pelvis  today.  This showed a 3 cm juxtarenal abdominal aortic aneurysm.  There  were 3 renal arteries on the left side, 2 smaller ones and 1 left main  renal artery.  There is a single right renal artery.  There was no  significant evidence of stenosis in any of these.  The CT scan was  remarkable for a 3.3 cm left adnexal mass.  Also noted was calcific  plaque in the common iliac artery bilaterally.   The patient has not had any episodes of pulmonary edema for almost 3  months.  She has no anatomic evidence of renal artery stenosis by  arteriogram or CT angiogram.  Dr. Jacinto Halim has questioned whether or not  she may be underperfused in her left kidney due to turbulent flow in the  small abdominal aortic aneurysm.  Resection of her abdominal aortic  aneurysm with reimplantation of all 3 of these renal arteries would be a  fairly significant operation with possible morbidity, and I believe this  would be for minimal gain as far as her pulmonary edema symptoms are  concerned, since there is no obvious anatomic explanation of renal  malperfusion contributing to her symptoms.  I believe the best treatment  for her would be continued observation yearly of her aortic aneurysm and  serial ultrasounds.  Hopefully, her pulmonary edema symptoms  will  continue to be improved with the recent adjustments of her medications  and decreased sodium intake and fluid intake.   In addition, I have referred her to Dr. Eda Paschal with GYN for pelvic  ultrasound evaluation of her left adnexal mass.  She will follow up with  me in 1 year's time for further evaluation of her aneurysm.  She will  follow up with Dr. Jacinto Halim as scheduled for her claudication symptoms.   Rachel Hora. Fields, MD  Electronically Signed   CEF/MEDQ  D:  07/21/2007  T:  07/21/2007  Job:  [id number]   cc:   Cristy Hilts. Jacinto Halim, MD  Gaspar Garbe, M.D.

## 2010-12-26 NOTE — Discharge Summary (Signed)
NAME:  Rachel Oneill, Rachel Oneill NO.:  [id number]   MEDICAL RECORD NO.:  [id number]          PATIENT TYPE:  INP   LOCATION:  2019                         FACILITY:  [facility]   PHYSICIAN:  Sheliah Plane, MD    DATE OF BIRTH:  [birth year]   DATE OF ADMISSION:  05/05/2006  DATE OF DISCHARGE:                                 DISCHARGE SUMMARY   DISCHARGE DATE:  Tentatively in 1 to 2 days.   ADMISSION DIAGNOSES:  1. Acute pulmonary edema.  2. Coronary artery disease.   DISCHARGE DIAGNOSES:  1. Coronary artery disease, status post multiple PCIs and coronary artery      bypass graft.  2. History of abdominal aortic aneurysm.  3. Peripheral vascular disease.  4. Hypertension.  5. Dyslipidemia.  6. Diabetes mellitus type 2.  7. Acute blood loss anemia.  8. Severe mitral regurgitation, status post mitral valve ring.  9. Postop volume overload.   CONSULTS:  May 10, 2006, Dr. Sheliah Plane was consulted for  cardiothoracic surgery.   PROCEDURES:  1. On May 09, 2006, the patient underwent right and left heart cath      by Dr. Nanetta Batty.  2. May 11, 2006, the patient underwent intraoperative transesophageal      echocardiographic by Dr. Noreene Larsson.  3. May 11, 2006, the patient underwent coronary occlusive disease with      stenosis of previously placed stents, mitral regurgitation, coronary      artery bypass grafting x2 with reverse saphenous vein graft to      posterior descending and reverse saphenous vein graft to the first      obtuse marginal, with left thigh endovein harvesting by Dr. Sheliah Plane.  Mitral valve repair with adverse [employer] model 4100,      annuloplasty ring size 26 mm, serial number [id number], by Dr. Sheliah Plane.   HISTORY AND PHYSICAL:  This is a 60 year old [place] American female with a  history of cardiac disease dated back to 2004.  Patient has a PCI to the  circumflex coronary artery.  In June of 2006, the  patient suffered an  inferior MI and underwent PCI to the right coronary artery.  In November of  2006, the patient underwent PCI to the circumflex and right coronary  arteries.  December of 2006, the patient was hospitalized for acute  pulmonary edema.  The patient's last heart catheterization was in December  of 2006, please see admission history and physical for further details.  The  patient presented to [facility] for further evaluation of acute  pulmonary edema.   HOSPITAL COURSE:  On admission, the patient's chest x-ray demonstrated  cardiomegaly with mild to moderate edema.  EKG revealed normal sinus rhythm,  left ventricular hypertrophy was present, left atrial enlargement was noted.  Possibility of prior inferior MI cannot be excluded.  Patient's initial set  of cardiac markers revealed a myoglobin of 46.1, CK-MB 1.9 and troponin  0.13.  White blood cell count was 8.7, hemoglobin 13.5 and hematocrit 39.6.  The patient  was admitted to the hospital and she was given aspirin, IV  heparin and IV nitroglycerin.  While in the hospital, the patient remained  chest pain free.  She underwent cardiac cath on May 07, 2006, which  showed LAD 30% ostial takeoff, a small diagonal vessel 99%, proximal  circumflex is stented, proximal obtuse marginal is approximately 60%, right  coronary artery has diffuse 95% and more distal areas have 60% stenosis.  The patient was noted to have severe mitral regurgitation and decreased LV  function.  It was best felt that the patient proceed with a mitral valve  replacement of the coronary artery bypass grafting and she is agreeable to  proceed.  Her surgery was scheduled for May 11, 2006.  The patient did  have preop carotid Dopplers, which showed no internal carotid artery  stenosis bilaterally and vertebral artery flow was antegrade.  The patient's  ABIs were 0.88 on the right and 0.82 on the left.   The patient underwent CABG x2 and  mitral valve repair without any  complications.  The patient extubated on postop day number 1.  She did  experience some postop anemia.  She did require two units of packed red  blood cells transfused and her hemoglobin and hematocrit have raised  appropriately.  Patient's hemoglobin and hematocrit is currently stable at  8.9.  She was started on Niferex and folic acid.  Patient did experience  some postop volume overload.  She was diuresed with Lasix and did respond  appropriately.  Patient did have some episodes of hypokalemia and was  replaced with potassium chloride.   Patient was started on amiodarone postoperatively for prophylactic atrial  fibrillation treatment.  She will continue this for about 6 to 8 weeks.  Currently, she is taking amiodarone without any problems.  Patient does have  a history of diabetes mellitus, she has been treated with a sliding scale of  insulin and Lantus q.h.s.  The patient's CBGs have been well controlled,  about 103, 110 and 133.  Patient has remained in normal sinus rhythm.   Patient is ambulating with cardiac rehab with a steady gait.  She was  transferred to 2000 on May 13, 2006.   PHYSICAL EXAMINATION:  Physical exam showed:  VITAL SIGNS:  Blood pressure 103/61, heart rate 98, respirations 20, temp  99.3, O2 saturation 98% on room air.  Weight 158, preop weight 156, CBGs  103, 110, 133 and telemetry normal sinus rhythm.  CARDIOVASCULAR:  Regular rate and rhythm.  RESPIRATIONS:  Clear to auscultation bilaterally.  ABDOMEN:  Benign.  EXTREMITIES:  Trace edema.  Incision is clean, dry and intact.  Positive  left thigh ecchymosis.   LABS:  Showed a white blood cell count of 11.3, hemoglobin 8.9, hematocrit  25, 7 and a platelet count of 147.  BMP showed a sodium of 135, potassium  3.8, chloride 105, CO2 25, BUN 18, creatinine 0.9, glucose count of 94.  DISCHARGE DISPOSITION:  The patient will be discharged home in the next 1 to  2 days  provided that her hemoglobin and hematocrit are stable.  She is  afebrile and her blood pressure is well controlled.   MEDICATIONS:  1. Aspirin 81 mg p.o. daily.  2. Lopressor 12.5 mg p.o. b.i.d.  3. Lipitor 80 mg p.o. daily.  4. Lasix 40 mg p.o. daily x5 days.  5. Potassium chloride 20 mEq  daily x5 days.  6. TriCor 145 mg p.o. daily.  7. Wellbutrin 150 mg p.o.  daily.  8. Plavix 75 mg p.o. daily.  9. Amiodarone 20 mg two tabs b.i.d. for 14 days, then one tab b.i.d.  10.Niferex 150 mg p.o. daily.  11.Folic acid 1 mg p.o. daily.  12.Glucotrol 2.5 mg p.o. daily.  13.Oxycodone one to two tabs every 4 hours p.r.n.   INSTRUMENT COUNT:  Patient instructed to a follow a low-fat, low-salt  diabetic diet.  No driving or heavy lifting greater than 10 pounds for 3  weeks.  The patient is to ambulate 3 times daily and increase activity as  tolerated.  She is instructed to continue her breathing exercises.  She may  shower and clean her wounds with mild and soap and water.  She is to call  the office if any wound problems should arise, such as incision erythema,  drainage, temp greater than 101.5.   FOLLOWUP:  The patient will have  followup appointment with Dr. Tyrone Sage.  Prior to seeing Dr. Tyrone Sage, she will have a chest x-ray taken at [facility].  She is to call Dr. Jacinto Halim for an appointment in 2 weeks for  cardiology followup.      Constance Holster, [place]      Sheliah Plane, MD  Electronically Signed    JMW/MEDQ  D:  05/15/2006  T:  05/15/2006  Job:  [id number]   cc:   Cristy Hilts. Jacinto Halim, MD

## 2010-12-26 NOTE — Discharge Summary (Signed)
NAME:  Rachel Oneill, Rachel Oneill NO.:  [id number]   MEDICAL RECORD NO.:  [id number]          PATIENT TYPE:  INP   LOCATION:  6526                         FACILITY:  [facility]   PHYSICIAN:  Cristy Hilts. Jacinto Halim, MD       DATE OF BIRTH:  28-May-1951   DATE OF ADMISSION:  06/23/2005  DATE OF DISCHARGE:  06/25/2005                                 DISCHARGE SUMMARY   Rachel Oneill came into the hospital with complaints of acute onset of  shortness of breath, dyspnea on exertion. She underwent CT of her chest,  pelvis, and abdomen which revealed cardiac enlargement, COPD changes,  scattered borderline mediastinal and hilar lymph nodes. Normal CT of the  thoracic aorta. She did have an abdominal aneurysm of 3.5 x 3.0, infrarenal.  She also had was found to have positive cardiac enzymes. Thus she had a non-  ST-elevation MI. She was seen by Dr. Jacinto Halim who decided that she should  undergo cardiac catheterization. This was done on June 23, 2005. She had  progressive disease. She had minor in-stent restenosis, 50% to her RCA  stent. She had a new 90% lesion in her RCA. She underwent 2.5 x 12, 2.5 x  20, and 2.5 x 8  Taxus stenting to her RCA and she also received a 3.0 x 12  Taxus stent to her circumflex proximal to an old stent, March 19, 2003. Her  EF was 35%. She had 80% to 90% calcific stenosis of her right iliac. She was  watched over the next two days. Her catheterization was a difficult  procedure and she received a total of 330 mL of contrast and her Altace was  held to prevent any renal dysfunction. She did undergo 2-D echocardiogram on  June 24, 2005, which revealed an EF of 35% to 45%, mild diffuse left LV  hypokinesis, severe hypokinesis of the inferior wall, and mild hypokinesis  of the posterolateral wall. There was mild to moderate mitral valve  regurgitation. The mitral regurgitation was eccentric and directed  posteriorly with a possibility of papillary muscle  dysfunction. She was seen  by Dr. Jacinto Halim on June 24, 2005, who thought she was stable to be  discharged home. Her blood pressure is 120/60, pulse 70, respirations 12.  She apparently also had a prolonged QTC on EKG done June 24, 2005, with  a QTC of 615, and on June 25, 2005, her QTC was 574.  Her EKG revealed  an old posterior MI with a non-Q MI with LAD and LVH.   LABORATORY DATA:  Sodium 140, potassium 3.5, BUN 15, creatinine 1.1.  Hemoglobin 9.9, hematocrit 29.6, WBC 9.6, and platelet count 254,000.  Chloride 111, CO2 22, glucose 120, calcium 9.3, hemoglobin A1c 6.1. CK-MB  161/10, the second was 142/7.0, third 154/5.6, and fourth 146/4.2. Her  troponins were 0.59 and 0.49.  BNP was 691 on admission.   DISCHARGE MEDICATIONS:  1.  Lipitor 80 mg one daily.  2.  Altace 5 mg one time per day.  3.  Plavix 75 mg one time per day.  4.  Metoprolol 25 mg two times per day.  5.  Lasix 40 mg two times per day.  6.  ?aldactone 25 mg one time per day.  7.  Enteric-coated aspirin 325 mg one time per day.  8.  Tricor 145 mg one time per day.  9.  Nu-Iron 150 mg two times per day.  10. Glucotrol 2.5 mg one time per day.  11. Wellbutrin 150 mg one time per day.  12. Nitroglycerin under her tongue under her tongue every five minutes times      three p.r.n. chest pain.   She will see Dr. Jacinto Halim on December 5th at 11 a.m. She is not to return to  work until she sees Dr. Jacinto Halim. If she has any problems with her groin she  will give our office a call. She should increase her activities slowly. She  was also seen by cardiac rehab while here in the hospital. She was not  interested in going to cardiac rehab phase II as an outpatient. She also saw  the tobacco cessation nurse. She is committed to quitting smoking.   DISCHARGE DIAGNOSES:  1.  Non-Q-wave myocardial infarction.  2.  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   3.  Ischemic cardiomyopathy.  4.  Status post catheterization with three stents to her right coronary      artery and one stent to her circumflex this admission. Old right      coronary artery stent, January 30, 2005, had a 50% in-stent re-stenosis      with a percutaneous coronary intervention done down to 0%. Old      circumflex stent 3.0 x 16, placed March 19, 2003, patent.  5.  Abdominal aortic aneurysm.  6.  Arteriosclerotic peripheral vascular disease with 80% to 90% calcific      stenosis of her right iliac.  7.  Tobacco smoking. Committed to quitting.  8.  Hypertension.  9.  Diabetes mellitus.  10. Hyperlipidemia.      Lezlie Octave, N.P.      Cristy Hilts. Jacinto Halim, MD  Electronically Signed    BB/MEDQ  D:  06/25/2005  T:  06/25/2005  Job:  [id number]   cc:   Gaspar Garbe, M.D.  Fax: [phone]

## 2010-12-26 NOTE — Op Note (Signed)
NAME:  Rachel Oneill, Rachel Oneill NO.:  [id number]   MEDICAL RECORD NO.:  [id number]          PATIENT TYPE:  INP   LOCATION:  2306                         FACILITY:  [facility]   PHYSICIAN:  Guadalupe Maple, M.D.  DATE OF BIRTH:  Sep 22, 1950   DATE OF PROCEDURE:  05/11/2006  DATE OF DISCHARGE:                                 OPERATIVE REPORT   PROCEDURE:  Intraoperative transesophageal echocardiography.   [name] is a 60 year old African-American female with a history of  coronary artery disease who presented with acute ischemia and pulmonary  edema. She was found to have severe 2-vessel coronary disease and moderate  to severe mitral regurgitation. She is now scheduled to undergo coronary  artery bypass grafting and mitral valve repair by Dr. Tyrone Sage.  Intraoperative transesophageal echocardiography was requested to evaluate  the mitral valve and to assess suitability for repair, and to determine if  any other valvular pathology was present and to serve as a monitor for  intraoperative volume status.   The patient was brought to the operating room at [facility] and  general anesthesia was induced without difficulty. The trachea was intubated  without difficulty. The transesophageal echocardiography probe was then  inserted into the esophagus without difficulty.   IMPRESSION PRE-BYPASS FINDINGS:  MITRAL VALVE: The mitral valve examination  showed there was thickening of the anterior leaflet in the area of the A2  region and there was a jet of mitral insufficiency which originated from the  A2 and to the posterior medial commissure or A3, P3 region. The leaflet  appeared thickened and myxomatous but there was no prolapse of the valve and  there was no evidence of ruptured chordae or flail segments that could be  appreciated. The mitral regurgitation severity was graded as moderate. This  was based on a regurgitant orifice area of 0.31 determined by the PISA  method and a vena contractor diameter of 0.49 cm. The jet did not appear to  be wall hugging.   AORTIC VALVE: The aortic valve was trileaflet and the leaflets appeared  mildly thickened but opened well and there was no evidence of aortic  insufficiency  and there was no calcification of the leaflets noted.   LEFT VENTRICLE:  There was mild left ventricular dysfunction. Left  ventricular diameter measured 47 cm at end-diastole and 38 cm at end-  systole. Ejection fraction was calculated at 48%. There was moderate left  ventricular hypertrophy which was concentric. The left ventricular wall  thickness measured 1.35 cm at end-diastole, at the mid paraplane level of  the anterior wall.  The inferior wall appeared moderately hypokinetic. There  was no thrombus noted in the left ventricular apex.   RIGHT VENTRICLE:  The right ventricular size appeared normal. There was good  contractility of the right ventricular free wall.   TRICUSPID VALVE:  There was trace tricuspid insufficiency, the tricuspid  valve appeared structurally normal.   INTRA-ATRIAL SEPTUM:  The intra-apical septum was intact without evidence of  patent foramen ovale or atrial septal defect.   LEFT ATRIUM:  The left atrial size  appeared to be within normal limits.  There was no thrombus noted in the left atrial or left atrial appendage.   POST-BYPASS FINDINGS:  1. Mitral valve. There was an annuloplasty ring noted in the mitral      position. The posterior leaflet was fixed and the anterior leaflet      showed the characteristic trap door appearance with an annuloplasty      ring. There was residual trace to 1+ mitral insufficiency noted.  2. Aortic valve. The aortic valve was unchanged from the pre-bypass study.      The leaflets open normally without aortic insufficiency.  3. Left ventricle. Left ventricular function showed initially severe      hypokinesis of the inferior wall and inferior septum which appear to       improve over time. Ejection fraction was estimated at 45-50%.  4. Right ventricle. The right ventricular size again appeared to be within      normal limits and there was good contractility to the right ventricular      free wall.           ______________________________  Guadalupe Maple, M.D.     DCJ/MEDQ  D:  05/11/2006  T:  05/12/2006  Job:  [id number]

## 2010-12-26 NOTE — H&P (Signed)
NAME:  Rachel Oneill, ZILBERMAN NO.:  [id number]   MEDICAL RECORD NO.:  [id number]          PATIENT TYPE:  INP   LOCATION:  2921                         FACILITY:  [facility]   PHYSICIAN:  Darlin Priestly, MD  DATE OF BIRTH:  06/11/1951   DATE OF ADMISSION:  01/30/2005  DATE OF DISCHARGE:                                HISTORY & PHYSICAL   CHIEF COMPLAINT:  Chest pain.   HISTORY OF PRESENT ILLNESS:  Patient is a 60 year old female with history of  coronary disease.  She had a TAXUS stent placed in August 2004 when she  presented with unstable angina.  This was done by Dr. Swaziland.  They had to  use her left groin because she apparently had a right iliac occlusion.  She  was also noted to have a 3 cm abdominal aortic aneurysm and severe MR with  normal LV function and scattered disease that was noncritical in the LAD and  RCA.  Prior to that admission she was on no medications.  She did have  followup with Dr. Swaziland but ultimately was dismissed from that practice.  Today she brought her husband into the ER who was not feeling well when she  developed substernal chest pain and diaphoresis and nausea.  She admits she  has been without her medications for 2 weeks.  She continues to smoke.  Her  symptoms in the emergency room are better after Lasix, Lovenox,  nitroglycerin and Zofran.   PAST MEDICAL HISTORY:  Her past medical history is remarkable for noninsulin-  dependent diabetes followed by Dr. Wylene Simmer.  She has hyperlipidemia and  mitral valve prolapse with severe MR.  She has had a renal cyst in the past.  She has peripheral vascular disease as noted above with a right iliac  occlusion at catheterization.  Her ABI's in 2004 were 0.89 on the right,  0.99 on the left.  She does have a small infrarenal aneurysm, it was 2.5 cm  by ultrasound and 3 cm by CT scan November 2004.  She had a left renal mass  noted during her August 2004 admission, followup CT scan 3 months later  was  okay showing a suspected cyst.   Her medications prior to her stopping them 2 weeks ago were Lipitor 80 mg a  day, Plavix 75 mg a day, aspirin 325 mg a day, Toprol XL 100 mg a day,  Lotrel 5/10 once daily and Glucophage 500 mg a day.   She has no known drug allergies.   SOCIAL HISTORY:  She is married with three children and eleven  grandchildren.  She is a half-a-pack-a-day smoker.  She works in a Therapist, art.  She says she could not afford her medications.   FAMILY HISTORY:  Her family history is unremarkable, the patient says she  can't remember.   REVIEW OF SYSTEMS:  Essentially unremarkable except noted above.  She has  had a negative mammogram in September 2005.   PHYSICAL EXAMINATION:  VITAL SIGNS:  Blood pressure 170/100, pulse 80,  respirations 18.  GENERAL:  She is a well-developed overweight  female in no acute distress.  HEENT:  Normocephalic.  Extraocular movements are intact.  Sclerae is  anicteric.  Neck is without bruit.  CHEST:  Few basilar rales bilaterally.  CARDIAC EXAM:  Regular rate and rhythm with a 2/6 systolic murmur at the  apex and a positive S4.  ABDOMEN:  Not distended, nontender.  EXTREMITIES:  Without edema.  Distal pulses are 2+/4 bilaterally.  NEUROLOGIC EXAM:  Grossly intact.   LABORATORIES:  White count is 16.8, hemoglobin 12.8, hematocrit 37.5,  platelets 268.  Sodium 139, potassium 3.4, BUN 14, creatinine 1.1.  Her  first troponin is 1.06.  LFTs are normal.  INR is 1.  EKG shows sinus rhythm  with LVH.  Chest x-ray is pending.   IMPRESSION:  1.  Unstable angina and congestive failure.  2.  Noncompliance.  3.  Hypertension, uncontrolled.  4.  Noninsulin-dependent diabetes.  5.  History of coronary disease with circumflex TAXUS stenting August 2004.  6.  Three centimeter abdominal aortic aneurysm November 2004.  7.  Hyperlipidemia.  8.  Smoking.   PLAN:  Patient will be admitted to CCU, started on Lovenox, IV  nitroglycerin.   We will resume her ACE inhibitor, beta blocker, Norvasc.  We  will continue to diurese her for now.  She will probably need a followup  catheterization.       LKK/MEDQ  D:  01/30/2005  T:  01/30/2005  Job:  [id number]

## 2010-12-26 NOTE — H&P (Signed)
NAME:  Rachel Oneill, Rachel Oneill NO.:  [id number]   MEDICAL RECORD NO.:  [id number]                   PATIENT TYPE:  EMS   LOCATION:  ED                                   FACILITY:  [facility]   PHYSICIAN:  Peter M. Swaziland, M.D.               DATE OF BIRTH:  July 07, 1951   DATE OF ADMISSION:  03/17/2003  DATE OF DISCHARGE:                                HISTORY & PHYSICAL   HISTORY OF PRESENT ILLNESS:  Rachel Oneill is a 60 year old black female with a  history of tobacco abuse, hypercholesterolemia and untreated hypertension  who presents with complaints of chest pain.  The patient complained of mid-  substernal chest pain with a burning quality beginning at 8 p.m. tonight.  It lasted 4-5 hours until she presented to the emergency room.  She states  she is now pain free.  She thought she just had bad indigestion.  She denied  nausea, vomiting, diaphoresis or shortness of breath.  She had similar chest  pain yesterday lasting a couple of hours.  There has been no radiation of  her pain.  She denies any prior history of coronary disease.   PAST MEDICAL HISTORY:  1. Significant for hypertension.  She has been on no medications for over     the past year.  2. Hypercholesterolemia.  3. History of mitral valve prolapse.  4. History of surgery for tubal ligation.   ALLERGIES:  She has no known allergies.   MEDICATIONS:  She has been on no medications.   SOCIAL HISTORY:  The patient works for a [employer] attorney.  She is married  with three grown children.  She smoked one half to one pack per day and has  smoked for probably 35 years.  She denies alcohol use.   FAMILY HISTORY:  Noncontributory.   REVIEW OF SYSTEMS:  Otherwise unremarkable.   PHYSICAL EXAMINATION:  GENERAL:  The patient is an obese black female in no  apparent distress.  VITAL SIGNS:  Initial blood pressure is 210/110, it is now 164/98, pulse is  88 and regular.  Respirations are 20.  She is afebrile.   Sats are 94% on  room air.  HEENT:  Pupils equal, round, reactive.  Conjunctivae clear.  Oropharynx is  clear.  NECK:  Without JVD, adenopathy, thyromegaly or bruits.  LUNGS:  Clear.  CARDIAC:  Reveals regular, rate and rhythm without murmurs, rubs, gallops or  clicks.  ABDOMEN:  Soft, obese, nontender.  There are no masses or  hepatosplenomegaly.  EXTREMITIES:  Without edema.  Pulses are 2+ and symmetric.  NEUROLOGIC:  Intact.  SKIN:  Reveals hyperkeratosis and hyperpigmentation over her back.   LABORATORY DATA:  ECG shows normal sinus rhythm, left atrial enlargement,  LVH with QT prolongation, ST-T wave changes consistent with lateral  ischemia.   Chest x-ray showed mild cardiomegaly with no active disease.   White  count is 11,700, hemoglobin 13.9, hematocrit 41.5, platelets 260,000.  Sodium 138, potassium 3.4, chloride 109, CO2 23, BUN 16, creatinine 1.0,  glucose of 324.  Urinalysis is benign.  CK is 164 with an MB of 9.0,  troponin is 1.24.   IMPRESSION:  1. Non-Q wave myocardial infarction.  2. Severe hypertension, untreated.  3. Tobacco abuse.  4. History of hypercholesterolemia.  5. Hyperglycemia.  6. Hypokalemia.  7. Obesity.   PLAN:  Admit to [facility] TCU.  Begin subcu Lovenox and oral  aspirin.  Begin IV nitroglycerin and load with IV Lopressor.  We will then  start p.o. Lopressor and an ACE inhibitor.  We will repeat potassium.  We  will obtain glycosylated hemoglobin and lipid panel.  Need to consider  elective cardiac catheterization.                                                Peter M. Swaziland, M.D.    PMJ/MEDQ  D:  03/18/2003  T:  03/18/2003  Job:  [id number]

## 2010-12-26 NOTE — Cardiovascular Report (Signed)
NAME:  Rachel Oneill, PASCALE NO.:  [id number]   MEDICAL RECORD NO.:  [id number]          PATIENT TYPE:  INP   LOCATION:  2007                         FACILITY:  [facility]   PHYSICIAN:  Nanetta Batty, M.D.   DATE OF BIRTH:  [birth year]   DATE OF PROCEDURE:  05/09/2006  DATE OF DISCHARGE:                              CARDIAC CATHETERIZATION   PROCEDURE:  Right and left heart cath.   HISTORY:  [name] is a 60 year old mildly overweight African American  female with a history of CAD status post multiple interventions in the past,  including circumflex and RCA.  She has known mitral valve disease.  Her  other problems include a known abdominal aortic aneurysm, hypertension,  dyslipidemia and diabetes.  She is admitted with acute pulmonary edema.  Her  enzymes were negative and a BNP was 447.  She presents now for right and  left heart cath to define her anatomy.  Apparently TEE did show moderate to  severe MR.   DESCRIPTION OF PROCEDURE:  The patient brought to the fifth floor [place]  cardiac cath lab in the postabsorptive state.  She seemed premedicated with  p.o. Valium.  Left groin was prepped and shaved in the usual sterile  fashion.  1% Xylocaine was used for local anesthesia.  A 6-French sheath was  inserted into the left femoral artery using the standard Seldinger  technique.  A 7-French sheath was inserted into the left femoral vein.  A 7-  [place] balloon-tip femoral dilution Swan-Ganz catheter was then advanced  through the right heart chambers and SA and PA blood samples were obtained  for Fick cardiac output determination.  A 6-French right and left Judkins  diagnostic catheter, as well as a 6-French pigtail catheter were used for  selective coronary angiography and left ventriculography, respectively.  Visipaque dye was used for the entirety of the case.  Aortic, ventricular  and pulmonic pressures were recorded.   HEMODYNAMIC RESULTS:  1. Aortic systolic  pressure was 169, diastolic pressure was 83.  2. Left ventricular systolic pressure was 169, diastolic pressures was 24.   SELECTIVE CORONARY ANGIOGRAPHY:  1. Left main normal.  2. LAD;  LAD had 30-50% fairly focal lesions in the mid and proximal mid      portion.  The first diagonal branch was small and was subtotally      occluded.  3. Circumflex; the proximal circumflex stent was widely patent.  The first      OM came off at an hyperacute angle and had 30% segmental stenosis      proximally.  4. Right coronary artery; this is a dominant vessel which was stented in      its proximal and mid third.  There was 80-95% segmental proximal end-      stent restenosis with 60% stenosis further down the mid portion of the      stent.  5. Left ventriculography; RAO left ventriculogram was performed using 25      mL of Visipaque dye at 12 mL/second.  The LV EF is approximately 45%  with at least 2+ angiographic MR.  6. Distal abdominal aortography performed using 20 mL of Visipaque dye at      20 mL/second x2; revealed widely patent renal arteries, small and      infrarenal abdominal aortic aneurysm and an 80% eccentric right      proximal external iliac artery stenosis.   IMPRESSION AND DISPOSITION:  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  She did not have a high V-wave on  right heart cath, but the transesophageal echocardiogram apparently showed  moderately severe mitral regurgitation.  This may be ischemically mediated,  however.  After discussion with Dr. Jacinto Halim, it was elected not to proceed  with PCI of the right coronary artery at this time, but rather for  consideration of cabbage MVR.  The guidewire and catheters were removed.  The sheaths were removed under direct pressure on the groin to achieve  hemostasis.  The patient left in stable condition.  She will be  gently  hydrated and renal function will be assessed.  Her case will be reviewed by  Dr. Jeanella Cara before final disposition is made.  She left the lab in stable  condition.      Nanetta Batty, M.D.  Electronically Signed     JB/MEDQ  D:  05/07/2006  T:  05/09/2006  Job:  [id number]   cc:   [facility]

## 2010-12-26 NOTE — H&P (Signed)
NAME:  SHARDEE, DIEU NO.:  [id number]   MEDICAL RECORD NO.:  [id number]          PATIENT TYPE:  INP   LOCATION:  3305                         FACILITY:  [facility]   PHYSICIAN:  Ulyses Amor, MD DATE OF BIRTH:  06/16/1951   DATE OF ADMISSION:  05/06/2006  DATE OF DISCHARGE:                                HISTORY & PHYSICAL   HISTORY OF PRESENT ILLNESS:  Rachel Oneill is a 60 year old black woman who  is admitted to [facility] for further management of acute pulmonary  edema.   The patient has a history of cardiac disease which dates back to 2004.  At  that time, she suffered an inferior myocardial infarction.  She subsequently  underwent stenting of the circumflex coronary artery.  In June of 2006, she  suffered an inferior myocardial infarction and underwent stenting of the  right coronary artery.  In November of 2006, she again underwent stenting of  the circumflex and right coronary arteries after presenting with a non-Q-  wave myocardial infarction.  In December of 2006, she was hospitalized for  acute pulmonary edema.  Troponin's were elevated.  Cardiac catheterization  demonstrated a patent right coronary artery stent site.  There was a 50%  thrombotic narrowing of the proximal right coronary artery.  She underwent  thrombus extraction.  The circumflex stent was patent.  Her ejection  fraction was estimated to be 50% to 55% with mild to moderate mitral  regurgitation.  A  TEE during that same hospitalization demonstrated  moderate to severe eccentric, posterior-directed mitral regurgitation  probably secondary to papillary muscle dysfunction.  Continued medical  therapy was recommended.   The patient was lying in bed this evening when she experienced the acute  onset of severe dyspnea.  EMS was summoned and she was transported to the  hospital.  She has been treated with BiPAP, which has restored adequate  oxygenation.  She reports no chest  pain, tightness, heaviness, pressure, or  squeezing.   In addition to the aforementioned cardiac disease, she has a history of  abdominal aortic aneurysm, peripheral vascular disease, hypertension,  dyslipidemia and diabetes mellitus.   MEDICATIONS:  Aspirin, enalapril, Glucotrol, iron, Lipitor, metoprolol,  Plavix, spironolactone, TriCor.   ALLERGIES:  SHELLFISH.   OPERATIONS:  None.   SIGNIFICANT INJURIES:  None.   SOCIAL HISTORY:  The patient lives with her husband.  She no longer smokes  cigarettes.  She does not use alcohol.   FAMILY HISTORY:  Notable for head and neck cancer in her mother.  Her  father's medical history is unremarkable.   REVIEW OF SYSTEMS:  Review of systems reveals no new problems related to her  head, eyes, ears, nose, mouth, throat, lungs, gastrointestinal system,  genitourinary system, or extremities.  There is no history of neurologic or  psychiatric disorder.  There is no history of fever, chills or weight loss.   PHYSICAL EXAMINATION:  VITAL SIGNS:  Blood pressure 102/67.  Pulse 76 and  regular.  Respirations 28.  Temperature 97.3.  Pulse oximetry 100% on BiPAP.  GENERAL:  The patient was  examined sometime after the above vitals were  recorded.  She was alert, oriented, appropriate, and responsive.  She was  not in any respiratory distress.  HEENT:  Head, eyes, nose, and mouth were normal.  NECK:  Without thyromegaly or adenopathy.  Carotid pulses were palpable  bilaterally and without bruits.  CARDIAC:  Examination revealed a normal S1 and S2.  There was no S3, S4,  murmur, rub, or click.  Cardiac rhythm was regular.  No chest wall  tenderness was noted.  LUNGS:  Clear.  ABDOMEN:  Soft and nontender.  There was no mass, hepatosplenomegaly, bruit,  distention, rebound, guarding, or rigidity.  Bowel sounds were normal.  BREAST, PELVIC AND RECTAL:  Examinations were not performed as they were not  pertinent to the reason for acute care  hospitalization.  EXTREMITIES:  Without edema, deviation, or deformity.  Radial and dorsalis  pedal pulses were palpable bilaterally.  NEUROLOGIC:  Brief screening neurologic survey was unremarkable.   LABORATORY AND ACCESSORY CLINICAL DATA:  The electrocardiogram revealed  normal sinus rhythm.  Left ventricular hypertrophy was present.  Left atrial  enlargement was noted.  The possibility of a prior inferior myocardial  infarction could not be excluded.   The chest radiograph demonstrated cardiomegaly with mild to moderate edema.   The initial set of cardiac markers revealed a myoglobin of 46.1, CK-MB 1.9  and troponin 0.13.  White count was 8.7 with a hemoglobin of 13.5 and  hematocrit of 39.6.  The remaining studies were pending at the time of this  dictation.   IMPRESSION:  1. Acute pulmonary edema; rule out acute myocardial infarction.  Troponin      0.13.  2. Coronary artery disease.  Status post multiple myocardial infarctions      and multiple percutaneous interventions as described above.  Her last      cardiac catheterization was in December 2006 and is as described above.  3. Ischemic cardiomyopathy, ejection fraction by most recent      catheterization 55%, though 35% to 40% by earlier studies.  Moderate      mitral regurgitation.  4. Abdominal aortic aneurysm.  5. Peripheral vascular disease.  6. Hypertension.  7. Dyslipidemia.  8. Diabetes mellitus.   PLAN:  1. Cardiac stepdown unit.  2. Serial cardiac enzymes.  3. Aspirin.  4. Intravenous heparin.  5. Intravenous nitroglycerin.  6. BiPAP.  7. Diuresis.  8. Daily weights, inputs and outputs.  9. Further measures per Dr. Jacinto Halim.      Ulyses Amor, MD  Electronically Signed     MSC/MEDQ  D:  05/06/2006  T:  05/06/2006  Job:  [id number]   cc:   Ulyses Amor, MD

## 2010-12-26 NOTE — Discharge Summary (Signed)
NAME:  Rachel Oneill, Rachel Oneill NO.:  [id number]   MEDICAL RECORD NO.:  [id number]                   PATIENT TYPE:  INP   LOCATION:  6522                                 FACILITY:  [facility]   PHYSICIAN:  Peter M. Swaziland, M.D.               DATE OF BIRTH:  [birth year]   DATE OF ADMISSION:  03/18/2003  DATE OF DISCHARGE:  03/21/2003                                 DISCHARGE SUMMARY   HISTORY OF PRESENT ILLNESS:  [name] is a 60 year old black female with  history of tobacco abuse, hypercholesterolemia, and hypertension, all  untreated, who presented with symptoms of prolonged chest pain. She  complained of mid sternal chest pain with a burning quality. It lasted four  to five hours prior to presenting to the emergency room. She had had also  symptoms of ingestion. She had had chest pain the proceeding day, lasting  approximately two hours. On admission, she was noted to have positive  cardiac enzymes and was admitted for further evaluation. The patient had  been told that she had hypertension in the past but had been on no treatment  for over a year. She also had a history of hypercholesterolemia. She smoked  anywhere from one half to one pack per day. For details of her past medical  history, social history, family history, and physical examination, please  see admission history and physical.   LABORATORY DATA:  ECG showed normal sinus rhythm with LVH. There was ST-T  wave changes consistent with lateral ischemia. Chest x-ray showed mild  basilar atelectasis. White count was 14,800, hemoglobin was 13.8, hematocrit  40.3, platelets 262,000. Sodium 139, potassium 4.2, chloride 110, CO2 22,  glucose 190, BUN 15, creatinine 0.8, calcium 9.3. Glycosylated hemoglobin  was 7.4%. CPK was 186 with 23.7 MB and troponin was 1.38. Followup CPK was  225 with 27.6 MB and troponin of 2.88. Cholesterol was 298, triglycerides  238, HDL 30, LDL 220. TSH was 0.434. ANA was  negative.   HOSPITAL COURSE:  The patient was admitted to telemetry. She was treated  with subcu Lovenox, IV nitroglycerin. She was admitted on IV Lopressor. Also  started on ACE inhibitor for severe hypertension. Potassium was repleted.  She was started on sliding scale insulin for her diabetes. With appropriate  treatment, the chest pain resolved. Her ECG showed persistent deep T-wave  inversion laterally. On March 19, 2003, she underwent cardiac  catheterization. We were unable to access the right femoral artery due to  occlusion of the right iliac. With left femoral access, she underwent  cardiac catheterization which showed a 90% stenosis in the proximal left  circumflex coronary artery. She had scattered nonobstructive disease in  early vessels. Left ventricular function was overall normal and ejection  fraction 55%. By angiography, it appeared to be severe mitral insufficiency.  We also did abdominal aortography which showed small infrarenal aneurysm.  The renal  arteries appeared normal. The right iliac artery was occluded with  good collaterals. We proceeded at this point with stenting of the left  circumflex coronary artery. The patient was treated with Angiomax. She  underwent placement of a 3.0 x 16 mm Taxus Express-II stent. This showed  excellent result with 0% residual. The patient tolerated the procedure and  had no post procedure complications. The groin site remained without  hematoma. She had no recurrent chest pain. Followup ECG showed persistence  of her LVH with ST-T wave changes laterally, although the lateral ST changes  did improve.   To further evaluation the mitral insufficiency noted on cardiac  catheterization, an echocardiogram was obtained. This demonstrated again  normal left ventricular function. There was moderate to marked LVH. There  was only mild mitral regurgitation noted on echocardiogram, suggesting that  mitral insufficiency noted on  catheterization may have been catheterization  induced. The left atrial size was upper limits of normal. Also to further  evaluate her peripheral vascular disease, she had lower extremity arterial  Doppler studies. This demonstrated ankle brachial indices of 0.89 on the  right and 0.99 on the left. There is biphasic wave forms throughout. There  was estimated at greater than 50% focal stenosis in the distal left femoral  artery. Based on these findings, it was felt that she had adequate  collateral flow on the right and no further intervention was warranted. She  also underwent abdominal ultrasound to further size the aneurysm noted on  angiography. Ultrasound showed no significant aneurysmal dilatation with a  maximal aortic diameter of 2.5 cm. There was noted an abnormality in the  left kidney. This was questioned a column of Bertin versus a left renal  mass. Followup CT without contrast is recommended to further assess this  area; however, since patient is stable for discharge, it is felt that this  can be done as an outpatient.   The patient's hypertension improved during her hospital stay with triple  drug therapy, including ACE inhibitors, beta blockers, and calcium-channel  blockers. She was normotensive at time of discharge. Her blood sugars also  improved. She was able to be taken off of sliding scale insulin. Forty-eight  hours after cardiac catheterization, she was started on Glucophage 500 mg  per day. She was seen by diabetic coordinator, and home glucose monitor was  ordered for her to check a.m. and h.s. blood sugars. She was offered  outpatient diabetic classes. She was instructed a carbohydrate-modified, low  fat, low salt diet. To address her severe hyperlipidemia, she was started on  high dose therapy with Lipitor 80 mg per day. She was also seen by smoking  cessation coordinator and strongly encouraged to stop smoking. At this point, it is felt that the patient is  stable for discharge. She was  discharged home in stable condition on March 21, 2003. She was also seen by  cardiac rehab in the hospital and referred for outpatient cardiac rehab  program.   DISCHARGE DIAGNOSES:  1. Non-Q-wave myocardial infarction.  2. Single-vessel obstructive atherosclerotic coronary disease status post     successful stenting of the left circumflex coronary artery.  3. Small infrarenal abdominal aortic aneurysm.  4. Peripheral vascular disease with right iliac occlusion.  5. Diabetes mellitus type 2, new onset.  6. Severe hypertension with hypertensive heart disease and left ventricular     hypertrophy.  7. Severe combined hyperlipidemia.  8. Tobacco abuse.  9. Question left renal mass versus column of Bertin  DISCHARGE MEDICATIONS:  1. Coated aspirin 325 mg daily.  2. Plavix 75 mg daily.  3. Lipitor 80 mg per day.  4. Toprol-XL 100 mg per day.  5. Lotrel 5/10 mg daily.  6. Glucophage 500 mg per day.  7. Nitroglycerin sublingual p.r.n.   DISCHARGE INSTRUCTIONS:  She will progress in her activity as outlined by  cardiac rehab. She will follow a carbohydrate-modified, low fat, low salt  diet. Instructed in smoking cessation. Will follow with Phase II cardiac  rehab. Followup appointment with Dr. [place] in two weeks. We will schedule a  CT of the abdomen without contrast as an outpatient.   DISCHARGE STATUS:  Improved.                                                Peter M. Sw[place]iland, M.D.   PMJ/MEDQ  D:  03/21/2003  T:  03/21/2003  Job:  [id number]

## 2010-12-26 NOTE — Cardiovascular Report (Signed)
NAME:  Rachel Oneill, Rachel Oneill NO.:  [id number]   MEDICAL RECORD NO.:  [id number]          PATIENT TYPE:  INP   LOCATION:  2921                         FACILITY:  [facility]   PHYSICIAN:  Cristy Hilts. Jacinto Halim, MD       DATE OF BIRTH:  08-23-1950   DATE OF PROCEDURE:  01/30/2005  DATE OF DISCHARGE:                              CARDIAC CATHETERIZATION   PROCEDURE PERFORMED:  1.  Left ventriculograph  2.  Selective right and left coronary arteriography.  3.  Right iliac arteriography.  4.  Distal abdominal aortogram.  5.  Percutaneous transluminal coronary angioplasty and stenting of the right      coronary artery.  6.  Thrombectomy using Export catheter.   THE PATIENT WAS ENROLLED IN A HORIZON STENT STUDY.   INDICATIONS:  [name] is a 60 year old African American female  with history of coronary artery disease, status post acute lateral wall  myocardial infarction on March 23, 2003, when she presented with circumflex  coronary artery occlusion. She undergone successful stenting with a 3.0 x 16  mm Taxus at that time. She now was admitted to [facility] emergency room  complaining of severe substernal chest discomfort associated with  diaphoresis, nausea, and vomiting. She was found to have ST segment  elevation in the inferiorly leads with profound ST segment depression in the  anterolateral leads suggestive of inferior and true posterior and lateral  wall MI. Given her ongoing chest discomfort and increasing EKG abnormalities  she was rushed to the cardiac catheterization lab at [facility] for  definitive diagnosis, heart catheterization, and possible PCI.   HEMODYNAMIC DATA:  The left ventricular pressures were 181/18 with an end-  diastolic pressure of 28 mmHg. The aortic pressure was 192/122 with a mean  of 151 mmHg. There was no pressure gradient across the aortic valve.   ANGIOGRAPHIC DATA:  LEFT VENTRICLE: The left ventricular systolic function  was mildly to  moderately depressed with an ejection fraction of 40% to 45%  inferior, posterior, and lateral wall akinesis. The septum was hypokinetic.  There was mild mitral regurgitation.  RIGHT CORONARY ARTERY: The right coronary artery is a moderate caliber  vessel. It had a 60% focal stenosis in the proximal to mid segment which is  not significantly changed from prior cardiac catheterization on March 23, 2003. However, the distal RCA was completely occluded.  LEFT MAIN: The left main is a moderately to large caliber vessel. It has  mild calcifications.  CIRCUMFLEX: The circumflex is completely occluded at the proximal segment at  the stent insertion site. This appears to be old.  LEFT ANTERIOR DESCENDING: The LAD is a moderate to large vessel with a large  first diagonal. It has mild diffuse disease. The proximal area appears to  have 30% to 40% stenosis. The LAD has mild diffuse disease.   The right femoral and external iliac arteriography appeared to initially  show the right iliac artery to have mild to moderate diffuse luminal  irregularity. The catheter was gently taken into the aortoiliac bifurcation  and injection of the iliac  bifurcation revealed what appeared to be an  occluded distal abdominal aorta.   The distal abdominal aortogram was performed through the right femoral  artery showing what appeared to be like an occluded distal abdominal aorta.  The distal abdominal aorta also revealed patent aortoiliac bifurcation,  however the left aortoiliac bifurcation appeared to have an 80% focal  stenosis.   However, when I performed antegrade distal abdominal aortogram, the  abdominal aorta was widely patent along with a patent aortoiliac  bifurcation.   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   INTERVENTION DATA:  Successful PTCA and stenting of the mid to distal RCA  with a  Horizon Study stent was a 2.5 x 20-mm stent. This stent was deployed  at 18 atmospheres of pressure for 55 seconds. A TIMI-III flow was maintained  at the end of the procedure.   RECOMMENDATIONS:  The patient will be continued on aggressive risk factor  modification. Also given the fact that she had peripheral arterial disease  she needs to have further evaluation for the same.   DIAGNOSTIC ANGIOGRAPHY:  Initially I used a right angiographic approach.  Please see the above dictation. Because of inability I probably was in a  false lumen. Because of the assumption that the distal abdominal aorta was  occluded I proceeded with a left brachial approach.   LEFT BRACHIAL APPROACH:  Using percutaneous technique, the left brachial  artery was easily cannulated with a 6-French short sheath. Then using a 6-  [place] multi-purpose B2 catheter, the catheter was advanced into the left  ventricle. The left ventriculography was performed both in the LAO and RAO  projections. The catheter was pulled back into the ascending aorta and  pressure gradient across the AV was monitored. The right coronary artery was  selectively engaged and angiography was performed. In a similar fashion, the  left main coronary angiography ws selectively engaged and angiography was  performed. Then the catheters were pulled back in the usual fashion.   TECHNIQUE OF INTERVENTION:  A 6-French FR4 guide was applied to engage the  right coronary artery. Because of significant clot burden, after passing the  wire I did establish some flow and reveal some significant clot throughout  the ostia. Hence, using an export catheter I performed thrombectomy. I was  able to obtain a large amount of white thrombus through the catheter. I  established TIMI-III flow post thrombectomy. Then after I had performed this  there was mild focal spasm noted in the distal RCA and also at the site of occlusion. Hence, a low-pressure balloon dilatation  with a 2.5 x 20-mm  balloon at 3 atmospheres of pressure for 30 seconds was performed. The  balloon was deflated and put back in the guiding catheter. Prior to  performing any of these procedures, I did administer initially about 60 cc  and then about 66 c of intracoronary adenosine. Then after this I decided to  directly stent the site of occlusion and leave the distal segment alone.  Then she was randomized by [employer] and she received a 2.5 x 20-mm  stent. This stent was deployed at 18 atmospheres of pressure for one minute.  Post stent deployment results were excellent with establishment of TIMI-III  flow. The proximal 60% stenosis now appeared to be mildly hazy, but when  compared to prior angiograms from prior myocardial infarction on March 22, 2003, this appeared  to be not significantly changed. The haziness could have  been secondary to passage of the instruments and  it was not flow limiting. It was decided not to stent this as she would have  very stents through this RCA. The patient tolerated overall procedure well.  A total of 220 cc of contrast was utilized for diagnostic and interventional  procedure.       JRG/MEDQ  D:  01/30/2005  T:  01/31/2005  Job:  [id number]   cc:   Gaspar Garbe, M.D.  [address]  [place]  [place] [address]  Fax: [phone]

## 2010-12-26 NOTE — Discharge Summary (Signed)
NAME:  Rachel Oneill, Rachel Oneill NO.:  [id number]   MEDICAL RECORD NO.:  [id number]          PATIENT TYPE:  INP   LOCATION:  6526                         FACILITY:  [facility]   PHYSICIAN:  Cristy Hilts. Jacinto Halim, MD       DATE OF BIRTH:  [birth year]   DATE OF ADMISSION:  06/23/2005  DATE OF DISCHARGE:  06/25/2005                                 DISCHARGE SUMMARY   ADDENDUM:   DISCHARGE MEDICATIONS:  They were adjusted according to her diagnoses and  her home medications prior to being admitted.  Thus, she went home on:  1.  Lipitor 80 mg one time per day.  2.  Enalapril 10 mg one time per day.  3.  Plavix 75 mg one time per day.  4.  Metoprolol 25 mg two times per day.  5.  Aldactone 25 mg one time per day.  6.  Enteric-coated aspirin 325 mg one time per day or plain 81 mg one time      per day.  7.  Tricor 145 mg one time per day.  8.  Neurontin 150 mg one time per day.  9.  Glucotrol XL 2.5 mg one time per day.  10. Wellbutrin 150 mg one time per day.  11. Nitroglycerin one under the tongue every five minutes x3 when needed for      chest pain.      Lezlie Octave, N.P.      Cristy Hilts. Jacinto Halim, MD  Electronically Signed    BB/MEDQ  D:  06/25/2005  T:  06/26/2005  Job:  [id number]

## 2010-12-26 NOTE — Discharge Summary (Signed)
NAME:  Rachel Oneill, Rachel Oneill NO.:  [id number]   MEDICAL RECORD NO.:  [id number]          PATIENT TYPE:  INP   LOCATION:  4702                         FACILITY:  [facility]   PHYSICIAN:  Cristy Hilts. Jacinto Halim, MD       DATE OF BIRTH:  [birth year]   DATE OF ADMISSION:  01/30/2005  DATE OF DISCHARGE:  02/04/2005                                 DISCHARGE SUMMARY   ADDENDUM:   DISCHARGE MEDICATIONS:  1.  Altace 5 mg once daily.  2.  Lipitor 80 mg once daily.  3.  Glucotrol 2.5 mg once daily.  4.  Aspirin 81 mg once daily.  5.  Plavix 75 mg once daily.  6.  Wellbutrin 150 mg once daily.  7.  Lopressor 50 mg b.i.d.  8.  Inspra 25 mg once daily.  9.  Lasix 40 mg b.i.d.  10. Nu-Iron 150 mg b.i.d.  11. Tricor 145 mg once daily.   ACTIVITY:  As tolerated without exertion.   DISCHARGE DIET:  Low-fat, low-cholesterol diet.   FOLLOWUP:  Appointment with Dr. Jacinto Halim is scheduled on 02/19/05 at 9:15. The  patient was also advised to follow up with Dr. Wylene Simmer to address the  problems with her diabetes.       MK/MEDQ  D:  02/04/2005  T:  02/04/2005  Job:  [id number]   cc:   [facility]

## 2010-12-26 NOTE — Consult Note (Signed)
NAME:  Rachel Oneill, Rachel Oneill NO.:  [id number]   MEDICAL RECORD NO.:  [id number]          PATIENT TYPE:  INP   LOCATION:  2007                         FACILITY:  [facility]   PHYSICIAN:  Sheliah Plane, MD    DATE OF BIRTH:  11-15-1950   DATE OF CONSULTATION:  05/10/2006  DATE OF DISCHARGE:                                   CONSULTATION   REQUESTING PHYSICIAN:  Dr. Edwinna Areola CARDIOLOGIST:  Dr. Jacinto Halim   PRIMARY CARE PHYSICIAN:  Dr. Wylene Simmer   REASON FOR CONSULTATION:  Recurrent in-stent stenosis of the right coronary  artery with moderate to severe mitral regurgitation.   HISTORY OF PRESENT ILLNESS:  The patient is a 60 year old female with known  coronary occlusive disease over the past 2 years has had a drug-coated stent  placed in the right coronary artery and two in the circumflex coronary  artery.  Over the past 4-5 months she had been doing well with some mild  exertional shortness of breath but no definite chest pain.  On the day of  admission May 06, 2006, while laying down watching TV, she became very  short of breath and was brought to the emergency room with diagnosis of  acute pulmonary edema treated with diuretics and BiPAP with improvement in  symptoms.  Peak troponin was 0.13.  Previous cardiac history showed cardiac  cath's in 2004 and 2006, proximal circumflex stent placed November 2006,  right coronary artery extensively stented, TEE May 07, 2006 showed  moderate MR, previous echo showed 55% ejection fraction and moderate to  severe MR.  She has a history of hypertension, history of hyperlipidemia,  type 2 diabetes (hemoglobin A1c is unknown), has a history of  hyperlipidemia, is a remote smoker, positive family history for cancer in  her father and her mother, she has four brothers and two sisters without  cardiac disease.  She has had no previous stroke, does have claudication  bilateral thighs, denies renal insufficiency.   PAST  MEDICAL HISTORY:  1. Previously presented December 2006 with acute pulmonary edema.  2. Has known abdominal aortic aneurysm and right iliac stenosis.   PREVIOUS SURGERY:  Tubal pregnancy.   SOCIAL HISTORY:  The patient is married, employed as a Tourist information centre manager for [employer], denies alcohol use.   MEDICATIONS:  1. Aspirin 81 mg a day.  2. Enalapril 20 mg a day.  3. Glucotrol 2.5 mg daily.  4. Iron complex 150 mg a day.  5. Lipitor 80 mg a day.  6. Lopressor 50 b.i.d.  7. TriCor 45 daily.  8. Wellbutrin 150 mg daily.  9. Spironolactone.   DRUG ALLERGIES:  SHELLFISH CAUSES ITCHING.   CARDIAC REVIEW OF SYSTEMS:  Negative for chest pain.  Presenting symptoms  were resting shortness of breath, she has a history of exertional shortness  of breath, orthopnea and lower extremity edema is mild.  Denies  palpitations, syncope, presyncope.   GENERAL REVIEW OF SYSTEMS:  She denies any constitutional symptoms.  Respiratory:  As noted above.  Denies hemoptysis.  Gastrointestinal:  Denies  any blood in  her stool or urine.  GU:  Denies blood in her urine.  Has had  no recent infections.  Hematologic:  Has a history of anemia, unknown cause.  She has had known diabetes for at least a year.  She has peripheral vascular  disease with bilateral claudication.  ABIs are 0.88 on the right, 0.82 on  the left.   PHYSICAL EXAMINATION:  GENERAL:  The patient is awake and alert and  neurologically intact and able to relate her history without difficulty.  VITAL SIGNS:  Blood pressure 110/58, pulse 69, respiratory rate 18, O2  saturation is 99% on room air.  She is 63 inches tall, 156 pounds.  HEENT:  The patient's pupils equal, round, and reactive to light.  Neck is  without carotid bruits.  CHEST:  Breath sounds are distant but without wheezing.  Cardiac exam  reveals a regular rate and rhythm with slight systolic murmur heard at the  left base 2/6.  LYMPHATICS:  Has no palpable lymph nodes.   SKIN:  Skin is without ischemic changes.  EXTREMITIES:  Right and left DP and PT pulses are +1.   LABORATORY FINDINGS REVEAL:  Hemoglobin of 13, hematocrit of 38, platelet  count 223.  Creatinine is 1.1.  BUN 27.  Glucose 223.  Cardiac cath and echo  films were reviewed.  The patient does have a small patent foramen evident  on echo.  LAD has a 30% ostial takeoff.  There is a small diagonal with  ostial 99%, proximal circumflex is stented, the first obtuse marginal has  approximately 60%, right coronary artery has diffuse 95% and more distal  areas have 60% stenosis.   IMPRESSION:  Patient who presented with acute pulmonary edema with known  moderate to severe mitral regurgitation, decreased left ventricular function  and operable coronary artery disease, particularly on the right.  I have  discussed with the patient risks and options and critical restenosis of the  right coronary artery with moderate to severe mitral stenosis with decreased  left ventricular function and symptomatic.   RECOMMENDATIONS:  Proceed with mitral valve repair or replacement and  coronary artery bypass grafting and closure of a patent foramen.  The risks  and options were discussed with the patient in detail, including the risk of  death, infection, stroke, myocardial infarction, bleeding, __________  ,  blood transfusion.  The patient is agreeable and willing to proceed.  We  will tentatively plan for surgery tomorrow.      Sheliah Plane, MD  Electronically Signed     EG/MEDQ  D:  05/10/2006  T:  05/10/2006  Job:  [id number]

## 2010-12-26 NOTE — Cardiovascular Report (Signed)
NAME:  Rachel Oneill, Rachel Oneill NO.:  [id number]   MEDICAL RECORD NO.:  [id number]          PATIENT TYPE:  INP   LOCATION:  6529                         FACILITY:  [facility]   PHYSICIAN:  Cristy Hilts. Jacinto Halim, MD       DATE OF BIRTH:  February 23, 1951   DATE OF PROCEDURE:  07/29/2005  DATE OF DISCHARGE:  07/30/2005                              CARDIAC CATHETERIZATION   PROCEDURE PERFORMED:  1.  Left ventriculography.  2.  Selective right and left coronary angiography.  3.  Thrombectomy using Expo catheter of the right coronary artery.  4.  Percutaneous transluminal coronary angioplasty and balloon angioplasty      of the thrombotic stenosis of the proximal right coronary artery stent      placed on June 23, 2005.   INDICATIONS FOR PROCEDURE:  Rachel Oneill is a very pleasant 60 year old  patient of mine with a history of hypertension, diabetes, hyperlipidemia,  who has had a non-Q wave myocardial infarction and had undergone PTCA and  stenting of the RCA.  It was a very complex intervention on June 23, 2005, with placement of three stents overlapping each other into the  proximal mid segments with a 2.5 by 12, 2.5 by 20, and a 2.5 by 8 mm stent.  She has had another 2.5 by 20 mm in the mid to distal segment that was  placed on January 30, 2005.  She was readmitted to the hospital last night with  the acute onset of shortness of breath associated with chest heaviness in  the middle of her chest similar to her anginal symptoms prior to her RCA  stenting.  During previous catheterization, her ejection fraction was found  to be around 35-45% with moderate to moderately severe eccentric mitral  valve regurgitation.  Hence, she was brought back to the cardiac  catheterization laboratory to re-evaluate her coronary anatomy.   HEMODYNAMIC DATA:  Left ventricular  pressure 101/5 with end diastolic  pressure of 12 mmHg.  The aortic pressure was 100/58 with a mean of 76 mmHg.  There was  no pressure gradient across the aortic valve.   ANGIOGRAPHIC DATA:  Left ventricle:  The left ventricular systolic function was preserved with  ejection fraction of 55%.  There was mild, at most, moderate mitral  regurgitation.  The mitral regurgitation was not well quantified secondary  to only minimal contrast used.  However, it did not appear to be  significant.   Right coronary artery:  The right coronary artery is a large caliber vessel  and dominant vessel.  There is a thrombotic 50% stenosis of the proximally  placed 2.5 by 12 mm stent.  There was good overlap noted between the four  stents that are previously placed.  Distally, the vessel was widely patent.   Left main:  The left main is large caliber vessel.   Circumflex:  The circumflex is a large caliber vessel.  The previously  placed stents in the circumflex coronary artery on June 23, 2005, is a 3  by 11 and March 19, 2003, 3 by 16 mm Taxus,  are widely patent.  The first  obtuse marginal has a 40% ostial stenosis.  The distal circumflex has a 40%  stenosis.   Left anterior descending:  The left anterior descending  is a large caliber  vessel.  It gives origin to small diagonals.  The mid LAD has 30-40%  stenosis which is unchanged from prior catheterization.   Right iliac arteriogram:  Right iliac arteriogram revealed the calcific  focal stenosis of the right common iliac artery constituting 50-60%  stenosis.  This was performed because of difficulty in accessing the right  groin previously.   INTERVENTIONAL DATA:  Successful PTCA of the thrombotic stenosis of the  proximal RCA stent with a 2.75 by 18 mm Powersail performed at the peak of  16 atmospheres pressure.  The overall stenosis was reduced from 50% with 0%  with TIMI III to TIMI III flow maintained at the end of the procedure.  There was a thrombotic appearing lesion that was noted proximal to the stent  and this was secondary to a wire pass which was  proven after the wire was  withdrawn.   RECOMMENDATIONS:  1.  The patient will be continued on aspirin and Plavix at least twice a day      for at least a period of 2-3 months and then switched to Plavix once      daily.  She probably will need Plavix indefinitely.  2.  Continued secondary prevention is indicated.  She probably will need a      repeat echocardiogram in a month to re-evaluate her mitral regurgitation      and LV systolic function.   TECHNIQUES OF PROCEDURE:  In the usual sterile technique, using a 6 French  right femoral artery access,  a 6 [place] multipurpose B2 catheter was  advanced from the ascending aorta and a 0.035 inch J-wire.  The catheter was  gently advanced to the left ventricle.  Left ventricular pressure was  monitored.  Hand contrast injection of the left ventricle was performed,  both in LAO and RAO projections.  The catheter was flushed with saline and  pulled back into the ascending aorta and pressure gradient across the aortic  valve was monitored.  The right coronary artery was selectively engaged and  angiography was performed.  Then, the catheter was pulled back into the  abdominal aorta and the right iliac artery was selectively engaged and  angiography was performed.  Then, the catheter was pulled out of the body in  the usual fashion.   TECHNIQUES OF INTERVENTION:  Using Angiomax for anticoagulation and  maintaining an ACT greater than 250, a 190 cm by 0.014 inch Luge guide-wire  was utilized to cross into the RCA and the tip of the wire was carefully  placed in the distal RCA.  Because of the presence of thrombus, initially a  balloon was utilized for angioplasty but the balloon angioplasty was aborted  and an Expo catheter was utilized and thrombectomy was performed of the  proximal RCA.  Clearly, because of what appeared to be under expanded stent into the proximal RCA, 2.74 by 18 mm Powersail balloon was utilized.  Multiple angioplasties up  to 16 atmospheres pressure from 30 to 60 seconds  was performed.  Intracoronary nitroglycerin was also administered.  A  pseudolesion was noted in the proximal RCA which disappeared after  withdrawal of the guide-wire.  Overall, the patient tolerated the procedure  well.  A total of 150 to 160 mL of  contrast was utilized for diagnostic and  interventional procedure.      Cristy Hilts. Jacinto Halim, MD  Electronically Signed     JRG/MEDQ  D:  07/29/2005  T:  07/31/2005  Job:  [id number]

## 2010-12-26 NOTE — Op Note (Signed)
NAME:  Rachel Oneill, Rachel Oneill NO.:  [id number]   MEDICAL RECORD NO.:  [id number]          PATIENT TYPE:  INP   LOCATION:  2306                         FACILITY:  [facility]   PHYSICIAN:  Sheliah Plane, MD    DATE OF BIRTH:  02/18/1951   DATE OF PROCEDURE:  DATE OF DISCHARGE:                                 OPERATIVE REPORT   PREOPERATIVE DIAGNOSES:  1. Coronary occlusive disease with stenosis of previously-placed stents.  2. Mitral regurgitation.  3. Question of patent foramen.   POSTOPERATIVE DIAGNOSES:  1. Coronary occlusive disease with stenosis of previously-placed stents.  2. Mitral regurgitation.  3. Question of patent foramen, no obvious patent foramen noted.   PROCEDURES PERFORMED:  1. Coronary artery bypass grafting x2 with reversed saphenous vein graft      to the posterior descending and reversed saphenous vein graft to the      first obtuse marginal, with left thigh endovein harvesting.  2. Mitral valve repair with [facility] model 4100 annuloplasty      ring, size 26 mm, serial number [id number].   SURGEON:  Sheliah Plane, MD   FIRST ASSISTANT:  Kerin Perna, MD   SECOND ASSISTANT:  Pecola Leisure, PA   BRIEF HISTORY:  The patient is a 60 year old female diabetic with diffuse  vascular disease, who presents with acute onset of flash pulmonary edema.  She was stabilized medically and underwent repeat catheterization  previously.  She has had drug-coated stents, multiple ones placed in the  right coronary artery and in the proximal circumflex coronary artery.  She  has had known at least moderate mitral regurgitation with evidence of  decreased LV function for a year and a half.  At the repeat cardiac  catheterization, the patient had diffuse disease throughout the stented  right coronary artery and approximately 50-60% lesion in the first obtuse  marginal.  The right coronary artery had luminal irregularities but no high-  grade stenosis.   A very small diagonal branch had an 80-90% lesion, but this  vessel was too small to bypass.  A preoperative PE had raised the issue of  possible patent foramen; however, at the time of TEE in the OR and also  probing the atrial septum, no obvious patent foramen was evident.  Coronary  artery bypass grafting and mitral valve repair was recommended to the  patient, who agreed and signed informed consent.   DESCRIPTION OF PROCEDURE:  With Swan-Ganz and arterial line monitors placed,  the patient underwent general endotracheal anesthesia without incident.  Transesophageal echocardiogram probe was placed and findings are dictated by  a separate note by Dr. Noreene Larsson.  This revealed at least moderate mitral  regurgitation with no ruptured chords.  Initial incision was made at the  right knee.  There was no suitable vein located in the right knee.  A small  incision made at the left knee and adequate vein was identified.  Using a  [employer]ant endovein harvesting system, vein was harvested from the left thigh  and was of adequate quality and caliber.  A median sternotomy was performed,  pericardium was opened.  The patient had evidence of calcification in the  ascending aorta.  She was systemically heparinized.  The ascending aorta was  cannulated, superior and inferior vena caval cannulas were placed.  A  retrograde cardioplegia catheter was placed.  An aortic root vent  cardioplegia needle was introduced into the ascending aorta.  The patient  was placed on cardiopulmonary bypass at 2.4 L/min. per sq. m.  Sites of  anastomosis of the posterior descending and first obtuse marginal were  selected and dissected out of the epicardium.  The patient's body  temperature was cooled to 30 degrees, the aortic crossclamp was applied, and  500 mL of cold blood potassium cardioplegia was administered with a rapid  diastolic arrest of the heart.  Myocardial septal temperature was monitored  during the crossclamp  period.  Additional cold retrograde cardioplegia was  administered.  Posterior descending coronary artery was opened and then  using a running 7-0 Prolene, a distal anastomosis was performed with a  segment of reversed saphenous vein graft.  This vessel was relatively small  but did admit a 1 mm probe distally and was approximately 1.3-1.5 mm in size  distally.  Attention was then turned to the first obtuse marginal coronary  artery, which was opened and also had some diffuse disease on palpation.  A  1.5 mm probe passed distally.  Using a running 7-0 Prolene, distal  anastomosis was performed.  Additional blood cardioplegia was administered  down the vein grafts and retrograde.  The left atrium was then opened along  the interatrial groove and with adequate retraction, exposure of the mitral  valve was obtained.  The valve was carefully inspected.  There were no free  chordae.  There was slight thickening of the A2 segment.  On passive filling  and from the TEE, it appeared that the primary area of leak was between A3  and P3 areas.  A McCarthy annuloplasty ring was selected and sized for a 26  mm.  It appeared that the primary lesion of the regurgitation was dilatation  of the mitral annulus, particularly in the P2-P3 area.  Using #2 Tycron,  nonpledgeted sutures circumferentially placed in the mitral valve annulus  and the annuloplasty ring was secured in place with passive filling.  On  inspection there was no evidence of mitral regurgitation.  The left atrial  appendage was sutured closed from the inside of the atrium. With a running 4-  0 Prolene.  The atriotomy was then closed with horizontal 3-0 Prolene suture  and the heart was de-aired through the atrial closure and further through  the aortic root vent.  With the aortic crossclamp still in place, two punch  aortotomies were performed proximally in the aorta to avoid the area of plaque in the midportion of the ascending aorta.   Each of the two vein  grafts were anastomosed to the ascending aorta.  Air was evacuated from the  grafts in the ascending aorta and the aortic crossclamp was removed with a  total crossclamp time of 104 minutes.  The patient spontaneously converted  to a slow sinus rhythm and then ultimately was atrially paced.  With both  TEE and with the atrium open, probing of the septum did not reveal any  patent foramen.  The patient initially was weaned from cardiopulmonary  bypass and had some transient elevation of the ST segments in the inferior  leads, I thought related to air.  These resolved completely and then the  patient stabilized on milrinone and low-dose dopamine infusion.  Because of  bleeding from the temperature probe stick site in the anterior wall, the  patient was placed back on bypass for approximately 2 minutes to lift the  heart and place a pledgeted stitch at the apex of the heart for control of  the bleeding from this site.  She was then weaned from cardiopulmonary  bypass again without difficulty.  She was decannulated in the usual fashion  and protamine sulfate was administered with the operative field hemostatic.  Atrial and ventricular pacing wires were applied, graft markers were  applied.  The pericardium was loosely reapproximated.  A single Blake  mediastinal tube was left in place.  The sternum was closed with #6  stainless steel wire, fascia closed with interrupted 0 Vicryl, running 3-0  Vicryl in the subcutaneous tissue and 4-0 subcuticular stitch in the skin  edges.  Dry dressings applied.  Sponge and needle count was reported as  correct at the completion of the procedure.  A TEE after the patient was  stabilized off the pump revealed a functional mitral valve without mitral  regurgitation.      Sheliah Plane, MD  Electronically Signed     EG/MEDQ  D:  05/12/2006  T:  05/12/2006  Job:  [id number]

## 2010-12-26 NOTE — Discharge Summary (Signed)
NAME:  Rachel Oneill, Rachel Oneill NO.:  [id number]   MEDICAL RECORD NO.:  [id number]          PATIENT TYPE:  INP   LOCATION:  4702                         FACILITY:  [facility]   PHYSICIAN:  Cristy Hilts. Jacinto Halim, MD       DATE OF BIRTH:  [birth year]   DATE OF ADMISSION:  01/30/2005  DATE OF DISCHARGE:  02/04/2005                                 DISCHARGE SUMMARY   DISCHARGE DIAGNOSES:  1.  Coronary artery disease, unstable angina pectoris.  2.  Status post acute myocardial infarction during this admission.  3.  Status post catheterization during this admission, with intervention to      the right coronary artery.  4.  Hypertension, uncontrolled.  5.  Noninsulin-dependent diabetes mellitus.  6.  Abdominal aortic aneurysm.  7.  Hyperlipidemia.  8.  Medical noncompliance.   HISTORY OF PRESENT ILLNESS/HOSPITAL COURSE:  This is a 60 year old, African-  American female with prior history of coronary artery disease and status  post stenting of the circumflex in 2004 who presented with unstable angina  to the emergency room at [facility].  Also, she complained of diaphoresis  and nausea, and she mentioned that she was not taking any of her medications  for two weeks.  She is a medical patient of Dr. Wylene Simmer.  She was assessed  by Dr. Jacinto Halim and transferred to [facility] to be admitted to the  telemetry unit on rule out MI protocol and also for a cardiac  catheterization hospital procedure.  Cardiac catheterization performed on  January 30, 2005 by Dr. Jacinto Halim revealed 100% occluded circumflex.  RCA was  previously stented had a 100% occlusion of the RCA in the proximal to distal  portion.  Her EF was 40%, end-diastolic pressure 28.  She also has a mild  MR, and catheterization revealed posterolateral akinesis.   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  The  patient also was noted to have  abdominal aortic aneurysm.  Dr. Jacinto Halim  attempted left arm access, and it was successful.  He was able to  successfully implant Horizon study stent into the RCA.   The next morning, she was assessed by Dr. Domingo Sep.  Her enzymes were  positive and consistent with NSTEMI, although EKG did not reveal any  changes.  The patient was monitored in the hospital on telemetry, and her  telemetry showed consistently sinus rhythm.   She was assessed by Dr. Clarene Duke on the day of discharge and considered to be  stable for discharge home.   HOSPITAL LABORATORIES:  CBC showed hemoglobin 9.7, hematocrit 28.4, white  blood cell count 13.8, platelets 236.  CMET revealed sodium 138, potassium  3.7, chloride 107, CO2 23, BUN 12, creatinine 1, glucose 96.   Cardiac panel showed CK extremely elevated, first set 6,647, last set 4,818.  MB revealed first set 479, and last set 180, which was improving.   Cholesterol showed 236, LDL 169, HDL 25, triglycerides 210.   BNP was 416.  The patient was having spikes of fever on a couple of occasions, around  100.8, and we ordered blood cultures, one set of which was positive. She was  covered on antibiotics while in the hospital.   DISCHARGE MEDICATIONS:  1.  Altace 5 mg daily.  2.  Lipitor 80 mg daily.  3.  Glucotrol 2.5 mg daily.  4.  Aspirin 81 mg daily.  5.  Plavix 75 mg daily.  6.  Wellbutrin 150 mg daily.  7.  Lopressor 50 mg daily.  8.  Inspra 25 mg daily.  9.  Lasix 40 mg b.i.d.  10. Nu-Iron 150 mg b.i.d.  11. Tricor 145 mg daily.       MK/MEDQ  D:  02/04/2005  T:  02/04/2005  Job:  [id number]

## 2010-12-26 NOTE — H&P (Signed)
NAME:  SAMHITA, KRETSCH NO.:  [id number]   MEDICAL RECORD NO.:  [id number]           PATIENT TYPE:   LOCATION:                                 FACILITY:   PHYSICIAN:  Ulyses Amor, MD      DATE OF BIRTH:   DATE OF ADMISSION:  07/29/2005  DATE OF DISCHARGE:                                HISTORY & PHYSICAL   Rachel Oneill is a 60 year old black woman who is admitted to [facility] for further management of acute pulmonary edema.   The patient has a history of cardiac disease which dates back to 2004.  At  that time she suffered an inferior posterior myocardial infarction.  She  subsequently underwent stenting of the circumflex coronary artery.  In June  of this year she suffered an inferior myocardial infarction and underwent  stenting of the right coronary artery.  One month ago on June 23, 2005  she again underwent stenting of the circumflex and right coronary arteries  after presenting with a non Q-wave myocardial infarction.  She is known to  have an ischemic cardiomyopathy with an ejection fraction last estimated to  be in the range of 35-45%.  She also has mild to moderate mitral  regurgitation.   The patient experienced the relatively sudden onset of dyspnea this evening.  She presented to [facility] where she was found to be in acute  pulmonary edema.  Treatment was initiated and she was transferred to [facility] for further management.  She experienced no chest pain,  tightness, heaviness, pressure, or squeezing in association with the dyspnea  tonight.  She has been taking her medications as prescribed and she has been  adhering to her diet.  Her dyspnea is markedly improved at this time  compared to her condition at the time of presentation.   In addition to the aforementioned cardiac disease, the patient has a history  of abdominal aortic aneurysm, peripheral vascular disease, hypertension,  dyslipidemia, and  diabetes mellitus.   MEDICATIONS:  Lipitor, Altace, Plavix, metoprolol, furosemide,  spironolactone, aspirin, TriCor, iron, Glucotrol, and Wellbutrin.   ALLERGIES:  None.   OPERATIONS:  None.   SIGNIFICANT INJURIES:  None.   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  She lives with her husband.  She stopped smoking cigarettes  approximately four months ago.  She did not use alcohol.   FAMILY HISTORY:  Notable for head and neck cancer in her mother.  Her  father's medical history is unremarkable.   REVIEW OF SYSTEMS:  No new problems related to her head, eyes, ears, nose,  mouth, throat, lungs, gastrointestinal system, genitourinary system, or  extremities.  There is no history of neurologic or psychiatric disorder.  There is no history of fever, chills, or weight loss.   PHYSICAL EXAMINATION:  VITAL SIGNS:  Blood pressure 143/72, pulse 85 and  regular, respirations 26, temperature 96.7, pulse ox 100% on 4 L via nasal  cannula.  GENERAL:  The patient was a middle-aged black woman in no discomfort.  She  was alert, oriented, appropriate, and responsive.  HEENT:  Normal.  NECK:  Without thyromegaly or adenopathy.  Carotid pulses were palpable  bilaterally and without bruits.  CARDIAC:  Normal S1 and S2.  There was no S3, S4, murmur, rub, or click.  Cardiac rhythm was regular.  No chest wall tenderness was noted.  LUNGS:  Clear.  ABDOMEN:  Soft and nontender.  There is no mass, hepatosplenomegaly, bruit,  distention, rebound, guarding, rigidity.  Bowel sounds were normal.  BREASTS:  Not performed as not pertinent to the reason for acute care  hospitalization.  PELVIC:  Not performed as not pertinent to the reason for acute care  hospitalization.  RECTAL:  Not performed as not pertinent to the reason for acute care  hospitalization.  EXTREMITIES:  Without edema, deviation, or deformity.  Radial and dorsalis  pedis pulses were palpable  bilaterally.  NEUROLOGIC:  Unremarkable.   The chest radiograph demonstrated acute pulmonary edema.  The  electrocardiogram revealed normal sinus rhythm with a nonspecific ST and T-  wave changes.  The initial set of cardiac markers revealed a myoglobin of  68, CK-MB 2.5, and troponin 0.05.  Potassium is 3.4, BUN 10, and creatinine  1.  BNP 552.  The remaining studies were pending at the time of this  dictation.   IMPRESSION:  1.  Acute pulmonary edema; congestive heart failure exacerbation; rule out      acute myocardial infarction.  2.  Coronary artery disease status post inferoposterior myocardial      infarction in 2004 resulting in circumflex stenting.  Status post      inferior myocardial infarction in June 2006 resulting in right coronary      artery stenting.  Status post non Q-wave myocardial infarction June 23, 2005 resulting in repeat stenting of the circumflex and right      coronary arteries.  3.  Ischemic cardiomyopathy, mild to moderate mitral regurgitation.      Ejection fraction estimated 35-45%.  4.  Abdominal aortic aneurysm.  5.  Peripheral vascular disease.  6.  Hypertension.  7.  Dyslipidemia.  8.  Diabetes mellitus.   PLAN:  1.  Telemetry unit.  2.  Serial cardiac enzymes.  3.  Aspirin.  4.  Intravenous heparin.  5.  Intravenous nitroglycerin.  6.  Diuresis.  7.  Daily weights, inputs and outputs.  8.  Oxygen.  9.  Further measures per Dr. Jacinto Halim.   This patient encounter was chaperoned by L. Easterwood.      Ulyses Amor, MD  Electronically Signed     MSC/MEDQ  D:  07/29/2005  T:  07/29/2005  Job:  [id number]   cc:   Cristy Hilts. Jacinto Halim, MD  Fax: [phone]

## 2010-12-26 NOTE — Cardiovascular Report (Signed)
NAME:  Rachel Oneill, Rachel Oneill NO.:  [id number]   MEDICAL RECORD NO.:  [id number]          PATIENT TYPE:  INP   LOCATION:  6526                         FACILITY:  [facility]   PHYSICIAN:  Cristy Hilts. Jacinto Halim, MD       DATE OF BIRTH:  Jan 20, 1951   DATE OF PROCEDURE:  06/23/2005  DATE OF DISCHARGE:                              CARDIAC CATHETERIZATION   PROCEDURES PERFORMED:  1.  Left ventriculography.  2.  Selective right and left coronary arteriography.  3.  Abdominal aortogram.  4.  Right and left iliac arteriogram.  5.  PTCA and stenting of the right coronary artery.  6.  PTCA and stenting of the left circumflex coronary artery.   INDICATIONS:  Rachel Oneill is a 60 year old African-American female with  history of hypertension, hyperlipidemia, history of known coronary artery  disease.  She has had an acute inferior posterior wall myocardial infarction  on March 19, 2003 and had a circumflex stent done with a 3 x 16 mm TAXUS  stent.  She was readmitted to [facility] on January 30, 2005 with  acute inferior wall myocardial infarction.  At that time the circumflex was  occluded and appeared chronic and the right coronary artery had a thrombotic  100% stenosis at the right coronary artery and it was felt to be a culprit.  At that time she had undergone successful PTCA and stenting of the mid to  distal right coronary artery with a 2.5 x 20 mm HORIZON study stent.  Now  she was admitted to [facility] with increasing shortness of breath  and abnormal EKG with cardiac markers showing positivity for myocardial  infarction.  Given a non Q-wave myocardial infarction she was brought to the  cardiac catheterization laboratory to reevaluate her coronary anatomy.   HEMODYNAMIC DATA:  The left ventricular pressures were 150/15 with an end-  diastolic pressure of 31 mmHg.  The aortic pressures were 143/79 with a mean  of 106 mmHg.  There was no pressure gradient across the aortic  valve.   ANGIOGRAPHIC DATA:  Abdominal aortogram:  Abdominal aortogram revealed a  small abdominal aneurysm in the distal abdominal aorta.  The aortoiliac  bifurcation was patent.   Right iliac artery:  Right iliac arteriogram revealed a high-grade calcific  stenosis of the right iliac artery constituting 80-90% stenosis.  I was  unable to cross this high-grade stenosis which extends into the distal  abdominal aorta.   Left iliac artery:  Left iliac artery was widely patent.   Right coronary artery:  Right coronary artery is a large caliber vessel and  a dominant vessel.  Gives origin to a large PDA and a PLA.  The previously  placed TAXUS stent on January 30, 2005 with acute inferior wall myocardial  infarction now shows about 50% restenosis.  There was a high-grade 90%  stenosis in the proximal to mid segment of the RCA which was previously only  about 60%.   Left main:  Left main is a large caliber vessel and is normal.   Circumflex:  Circumflex is now  widely open.  Previously during acute  inferoposterior wall myocardial infarction it was felt that it was  chronically occluded at the stent site.  However, now the stent is widely  recannulized and in the proximal segment of the circumflex there is a high-  grade 90% stenosis.   The circumflex itself gives origin to large OM1 and two small OM2 and OM3.   LAD:  Left anterior descending is a large caliber vessel.  Gives origin to a  large diagonal 1 and several small diagonals.  There is mild luminal  irregularity and after the large diagonal 1 there is a 40% eccentric LAD  stenosis.   Left ventricle:  Left ventricular systolic function was moderate to markedly  depressed with ejection fraction only 35%.  There is inferior,  inferolateral, and posterior wall akinesis.   INTERVENTIONAL DATA:  Successful PTCA and stenting of the right coronary  artery.  Three overlapping stents had to be utilized secondary to edge  dissection.   Extending from proximal to distal covering the previously  placed stent included stent #1 2.5 x 12 mm TAXUS overlapped with a 2.5 x 20  mm TAXUS and another 2.5 x 8 mm TAXUS extending from proximal to the mid  segment of the right coronary artery.  The previously placed TAXUS stent on  January 30, 2005 during acute inferior wall myocardial infarction had a 50% in-  stent restenosis and this was balloon angioplastied with the same stent  balloon.  These stents were post dilated with a 2.75 x 12 mm PowerSail at 16  atmospheres of pressure.  Overall the stenosis was reduced from 90% to 0%  with TIMI 3 to TIMI 3 flow maintained at the end of procedure.   Successful PTCA and stenting of the proximal and ostial circumflex coronary  artery with a 3 x 12 mm TAXUS stent deployed at 10 atmospheres of pressure  and post dilated with a 3.25 x 9 mm PowerSail at 14 atmospheres of pressure  max.  The stenosis was reduced from 90% to 0% with excellent TIMI 3 to TIMI  3 flow maintained at the end of procedure.  This TAXUS stent was clearly  overlapped with the previously placed 3 x 16 mm TAXUS in the mid segment of  the circumflex.   A total of 330 mL of contrast was utilized for diagnostic angiography.   RECOMMENDATIONS:  Patient will be observed inpatient for at least 48 hours  to follow up on her serum creatinine and also on her cardiac enzymes.  She  probably will need an outpatient Cardiolite stress test and also a repeat  echocardiogram to reevaluate her LV systolic function.  Also, her peripheral  anatomy shows significant high-grade calcific stenosis of the right iliac  artery.  However, this needs to be correlated with her symptoms.  She also  has a moderate sized abdominal aortic aneurysm and that will need a follow-  up.   TECHNIQUE OF PROCEDURE:  Diagnostic cardiac catheterization:  Under usual sterile precaution initially I approached through the right groin access and  I was unable to cross  through the high-grade calcific stenosis of the  external iliac artery.  Hence, after performing an iliac arteriogram and  also with the help of a multipurpose B2 catheter and Wholey wire and also  Glidewire was unable to cross through the high-grade stenosis of the right  iliac artery.  Hence, I attempted to go from the left groin.  I was able to  easily obtain 6-French right femoral arterial access and was easily able to  advance the J-wire.  A 6-French multipurpose B2 catheter was utilized to  perform left ventriculography both in RAO and LAO projection.  The same  catheter was utilized to engage the right coronary artery and angiography  was performed.  Then the catheter was also utilized to perform abdominal  aortogram and then pulled out of the body in the usual fashion.   A 6-French Judkins left 4 diagnostic catheter was utilized to engage the  left main coronary artery and angiography was repeated.  Then the catheter  was pulled out of the body in the usual fashion.   TECHNIQUE OF INTERVENTION:  A 6-French JR4 guide was utilized to engage the  right coronary artery and a 190 cm x 0.014 inch ATW guidewire was utilized  to cross into the right coronary artery and the lesion length was measured.  I decided to directly stent this right coronary artery and hence a 2.5 x 20  mm TAXUS stent was utilized and after trying to carefully position the stent  overlapping with the previously placed stent distally I deployed the stent  at 18 atmospheres of pressure for 60 seconds.  Post stent deployment I  realized that there was proximal edge dissection so was the distal end of  the stent had not covered the previously placed stent on the lateral aspect  only, but had covered it on the inner aspect.  However, there was an edge  dissection.  Hence,  I had to proceed with stenting both proximally and  distally.  I did have difficulty in advancing the same stent balloon in  between the distal stent  to perform angioplasty, hence, I used a 2.74 x 13  mm PowerSail balloon and multiple inflations keeping the balloon within the  old stent and the new stent.  This was performed at 16 atmospheres of  pressure for 30 seconds.  After multiple inflations within the stent a 2.5 x  8 mm TAXUS stent was advanced into the right coronary artery and after  difficulty I was able to advance the stent between the two stent struts and  stent was deployed at 18 atmospheres of pressure for 48 seconds.  The same  stent balloon was utilized to perform multiple angioplasties at 16-18  atmospheres of pressure from 15-60 seconds.  Then, the attention was to  tackle the proximal edge dissection which was done with a 2.5 x 12 mm TAXUS  deployed at 12 atmospheres of pressure and then this same stent balloon was  utilized to perform 16 atmospheres of pressure balloon dilatation keeping the proximal edge of the balloon clearly within the stent.  Hence, overall,  three overlapping stents were placed within the proximal and mid right  coronary artery and overlapped with the previously placed TAXUS stent which  was placed on January 30, 2005.  I also realized that there was a 50% in-stent  restenosis in the TAXUS stent and hence that was directed with the same  stent balloon at 10 atmospheres of pressure keeping the balloon within the  same stent struts.  Then, multiple injections of intracoronary nitroglycerin  was administered throughout the procedure.  Angiography was repeated.  Overall excellent results were noted.  The ostium of the right coronary  artery does have about 20-30% stenosis; however, this was felt to be not  significant.  Then, the guidewire was withdrawn and angiography repeated and  guide catheter disengaged, pulled out  of the body in the usual fashion.   Then the attention was directed to the left circumflex coronary artery.  A 7-  [place] FL4 guide was utilized to engage left main coronary artery and  using  the same ATW guidewire the circumflex was crossed and the tip of the wire  was carefully positioned in the distal circumflex.  Then using a 3 x 12 mm  TAXUS and carefully positioning it between the previously placed stent on  March 19, 2003 and keeping the stent at the ostium of the circumflex the  stent was deployed at 10 atmospheres of pressure for 50 seconds and post  dilated with a 3.25 x 8 mm PowerSail at 12 and 14 atmospheres of pressure  for 30 seconds each.  Excellent results were noted and this was confirmed by  angiography.  Anticoagulation was maintained per protocol. Then the guide  catheter was disengaged and pulled out of the body in the usual fashion.  Patient tolerated procedure well.  No immediate complications were noted.      Cristy Hilts. Jacinto Halim, MD  Electronically Signed     JRG/MEDQ  D:  06/23/2005  T:  06/23/2005  Job:  [id number]   cc:   Gaspar Garbe, M.D.  Fax: [phone]

## 2010-12-26 NOTE — Discharge Summary (Signed)
NAME:  Rachel Oneill, Rachel Oneill NO.:  [id number]   MEDICAL RECORD NO.:  [id number]          PATIENT TYPE:  INP   LOCATION:  6529                         FACILITY:  [facility]   PHYSICIAN:  Cristy Hilts. Jacinto Halim, MD       DATE OF BIRTH:  05-Dec-1950   DATE OF ADMISSION:  07/28/2005  DATE OF DISCHARGE:  07/30/2005                                 DISCHARGE SUMMARY   DISCHARGE DIAGNOSES:  1.  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  Coronary disease, status post inferoposterior myocardial infarction,      2004, resulting in circumflex stenting, right coronary artery stenting,      June of 2006, and again, circumflex and right coronary artery stenting      in November of 2006.  3.  Ischemic cardiomyopathy with an ejection fraction of 35% to 40%.  4.  Peripheral vascular disease.  5.  Hypertension.  6.  Dyslipidemia.  7.  Non-insulin-dependent diabetes.  8.  Pulmonary edema on admission, which is improved at discharge.   HOSPITAL COURSE:  The patient is a 61 year old female who was admitted to  [facility] with shortness of breath.  She presented initially to [facility]  and was found to be in acute pulmonary edema.  Treatment was started there  and she was transferred to [facility], admitted by Dr. Waldon Reining.  Her initial BNP  was 552.  She was started on IV heparin.  She improved symptomatically with  diuresis.  Her troponins were positive at 0.11.  She was taken to the cath  lab, July 29, 2005.  This revealed patent RCA stent site.  There was a  50% thrombotic narrowing of the proximal RCA.  She underwent thrombus  extraction.  Circumflex stent was patent.  EF was 50% to 55% with mild-to-  moderate MR.  There was a calcific 50% to 60% right iliac artery narrowing.  TEE done July 30, 2005 showed moderate-to-severe eccentric, posterior-  directed MR probably secondary to papillary muscle  dysfunction.  She is  discharged and will follow up with Dr. Jacinto Halim in the office.   DISCHARGE MEDICATIONS:  1.  Lipitor 80 mg a day.  2.  Altace 5 mg a day.  3.  Metoprolol 25 mg twice daily.  4.  Lasix 40 mg twice daily.  5.  Aldactone 25 mg a day.  6.  Tricor 145 daily.  7.  Glucotrol 2.5 mg a day.  8.  Wellbutrin 150 daily.  9.  Nu-Iron 150 twice daily.  10. Potassium 20 mEq a day.   LABORATORY DATA:  EKG shows sinus rhythm with inferior Q's.   CT angiogram of the chest on July 29, 2005 showed CHF with no PE.   CT of the abdomen showed 3.5 x 3.0 infrarenal abdominal aortic aneurysm  without dissection.   White count 7.9, hemoglobin 11.1, hematocrit 32.5, platelets 289,000.  INR  1.0.  D-dimer was 2.2 on admission.  Sodium 142, potassium 3.6, BUN 9,  creatinine 1.1.  AST was 39, ALT 40,  ALP 62.  Troponins were 0.11.  CK-MBs  were negative.  BNP was 552 on admission   DISPOSITION:  The patient is discharged in stable condition and will follow  up Dr. Jacinto Halim in the office.      Abelino Derrick, P.A.      Cristy Hilts. Jacinto Halim, MD  Electronically Signed    LKK/MEDQ  D:  09/07/2005  T:  09/08/2005  Job:  [id number]

## 2011-03-11 ENCOUNTER — Emergency Department (HOSPITAL_COMMUNITY)
Admission: EM | Admit: 2011-03-11 | Discharge: 2011-03-11 | Disposition: A | Discharge status: Home / Self Care | Payer: Self-pay | Attending: Emergency Medicine | Admitting: Emergency Medicine

## 2011-03-11 DIAGNOSIS — E785 Hyperlipidemia, unspecified: Secondary | ICD-10-CM | POA: Insufficient documentation

## 2011-03-11 DIAGNOSIS — M79609 Pain in unspecified limb: Secondary | ICD-10-CM | POA: Insufficient documentation

## 2011-03-11 DIAGNOSIS — Z7982 Long term (current) use of aspirin: Secondary | ICD-10-CM | POA: Insufficient documentation

## 2011-03-11 DIAGNOSIS — I509 Heart failure, unspecified: Secondary | ICD-10-CM | POA: Insufficient documentation

## 2011-03-11 DIAGNOSIS — Z79899 Other long term (current) drug therapy: Secondary | ICD-10-CM | POA: Insufficient documentation

## 2011-03-11 DIAGNOSIS — E119 Type 2 diabetes mellitus without complications: Secondary | ICD-10-CM | POA: Insufficient documentation

## 2011-03-11 DIAGNOSIS — I2510 Atherosclerotic heart disease of native coronary artery without angina pectoris: Secondary | ICD-10-CM | POA: Insufficient documentation

## 2011-03-11 LAB — BASIC METABOLIC PANEL
CO2: 26 mEq/L (ref 19–32)
Calcium: 9.6 mg/dL (ref 8.4–10.5)
Creatinine, Ser: 1.5 mg/dL — ABNORMAL HIGH (ref 0.50–1.10)
GFR calc non Af Amer: 35 mL/min — ABNORMAL LOW (ref >60)
Glucose, Bld: 204 mg/dL — ABNORMAL HIGH (ref 70–99)

## 2011-03-11 LAB — CK: Total CK: 74 U/L (ref 7–177)

## 2011-03-11 LAB — CBC
MCH: 28.7 pg (ref 26.0–34.0)
MCHC: 33.5 g/dL (ref 30.0–36.0)
MCV: 85.5 fL (ref 78.0–100.0)
Platelets: 193 10*3/uL (ref 150–400)
RBC: 4.29 MIL/uL (ref 3.87–5.11)

## 2011-03-11 LAB — DIFFERENTIAL
Basophils Relative: 1 % (ref 0–1)
Eosinophils Absolute: 0.1 10*3/uL (ref 0.0–0.7)
Lymphs Abs: 2.2 10*3/uL (ref 0.7–4.0)
Monocytes Absolute: 1.1 10*3/uL — ABNORMAL HIGH (ref 0.1–1.0)
Monocytes Relative: 17 % — ABNORMAL HIGH (ref 3–12)
Neutrophils Relative %: 49 % (ref 43–77)

## 2011-04-02 ENCOUNTER — Other Ambulatory Visit: Payer: Self-pay | Admitting: Family Medicine

## 2011-04-06 ENCOUNTER — Emergency Department (HOSPITAL_COMMUNITY)
Admission: EM | Admit: 2011-04-06 | Discharge: 2011-04-06 | Disposition: A | Discharge status: Home / Self Care | Payer: No Typology Code available for payment source | Attending: Emergency Medicine | Admitting: Emergency Medicine

## 2011-04-06 DIAGNOSIS — I959 Hypotension, unspecified: Secondary | ICD-10-CM | POA: Insufficient documentation

## 2011-04-06 DIAGNOSIS — Z043 Encounter for examination and observation following other accident: Secondary | ICD-10-CM | POA: Insufficient documentation

## 2011-04-06 DIAGNOSIS — I2510 Atherosclerotic heart disease of native coronary artery without angina pectoris: Secondary | ICD-10-CM | POA: Insufficient documentation

## 2011-04-06 DIAGNOSIS — I509 Heart failure, unspecified: Secondary | ICD-10-CM | POA: Insufficient documentation

## 2011-04-06 DIAGNOSIS — E785 Hyperlipidemia, unspecified: Secondary | ICD-10-CM | POA: Insufficient documentation

## 2011-04-06 DIAGNOSIS — E119 Type 2 diabetes mellitus without complications: Secondary | ICD-10-CM | POA: Insufficient documentation

## 2011-05-07 LAB — BASIC METABOLIC PANEL
BUN: 10
CO2: 24
Calcium: 9.1
Chloride: 108
Creatinine, Ser: 0.92

## 2011-05-07 LAB — CBC
MCHC: 34.4
MCV: 88.9
Platelets: 201

## 2011-05-21 LAB — CARDIAC PANEL(CRET KIN+CKTOT+MB+TROPI)
CK, MB: 2
CK, MB: 7.1 — ABNORMAL HIGH
Relative Index: 4.7 — ABNORMAL HIGH
Relative Index: 5 — ABNORMAL HIGH
Relative Index: INVALID
Total CK: 72
Total CK: 85
Troponin I: 0.07 — ABNORMAL HIGH
Troponin I: 0.09 — ABNORMAL HIGH
Troponin I: 0.31 — ABNORMAL HIGH

## 2011-05-21 LAB — POCT CARDIAC MARKERS
Operator id: 291361
Troponin i, poc: <0.05

## 2011-05-21 LAB — CBC
HCT: 32 — ABNORMAL LOW
HCT: 34.9 — ABNORMAL LOW
Hemoglobin: 10.7 — ABNORMAL LOW
Hemoglobin: 11.8 — ABNORMAL LOW
MCHC: 33.4
MCHC: 33.6
MCV: 89.6
Platelets: 205
Platelets: 220
RBC: 3.56 — ABNORMAL LOW
RBC: 3.91
RDW: 13.5
RDW: 13.6
RDW: 13.7
WBC: 12.3 — ABNORMAL HIGH
WBC: 7.4

## 2011-05-21 LAB — LIPID PANEL
LDL Cholesterol: 80
Triglycerides: 76

## 2011-05-21 LAB — BASIC METABOLIC PANEL
CO2: 24
Calcium: 9
Calcium: 9.7
Chloride: 108
Chloride: 108
Creatinine, Ser: 0.94
Creatinine, Ser: 1.14
GFR calc Af Amer: >60
GFR calc Af Amer: >60
GFR calc non Af Amer: 49 — ABNORMAL LOW
Glucose, Bld: 103 — ABNORMAL HIGH
Glucose, Bld: 136 — ABNORMAL HIGH
Potassium: 3.9
Sodium: 139
Sodium: 139

## 2011-05-21 LAB — POCT I-STAT CREATININE
Creatinine, Ser: 1
Operator id: 294501

## 2011-05-21 LAB — I-STAT 8, (EC8 V) (CONVERTED LAB)
Acid-base deficit: 2
Bicarbonate: 20
Chloride: 110
pCO2, Ven: 27.6 — ABNORMAL LOW
pH, Ven: 7.467 — ABNORMAL HIGH

## 2011-05-21 LAB — HEPARIN LEVEL (UNFRACTIONATED)
Heparin Unfractionated: 0.52
Heparin Unfractionated: 0.69

## 2011-05-21 LAB — CK TOTAL AND CKMB (NOT AT ARMC)
CK, MB: 4.5 — ABNORMAL HIGH
Total CK: 128

## 2011-05-21 LAB — DIFFERENTIAL
Basophils Absolute: 0
Basophils Relative: 0
Neutro Abs: 9.7 — ABNORMAL HIGH
Neutrophils Relative %: 73

## 2011-05-21 LAB — D-DIMER, QUANTITATIVE
D-Dimer, Quant: 0.24
D-Dimer, Quant: 1.05 — ABNORMAL HIGH

## 2011-05-21 LAB — APTT: aPTT: 91 — ABNORMAL HIGH

## 2011-05-21 LAB — B-NATRIURETIC PEPTIDE (CONVERTED LAB)
Pro B Natriuretic peptide (BNP): 171 — ABNORMAL HIGH
Pro B Natriuretic peptide (BNP): 553 — ABNORMAL HIGH
Pro B Natriuretic peptide (BNP): 824 — ABNORMAL HIGH

## 2011-05-21 LAB — MAGNESIUM: Magnesium: 1.7

## 2011-05-21 LAB — TSH: TSH: 1.072

## 2011-05-21 LAB — PROTIME-INR: Prothrombin Time: 13.6

## 2011-10-09 ENCOUNTER — Telehealth: Payer: Self-pay | Admitting: Internal Medicine

## 2011-10-09 NOTE — Telephone Encounter (Signed)
Erroneous encounter

## 2012-09-01 ENCOUNTER — Encounter (HOSPITAL_COMMUNITY): Payer: Self-pay

## 2012-09-01 ENCOUNTER — Emergency Department (INDEPENDENT_AMBULATORY_CARE_PROVIDER_SITE_OTHER)
Admission: EM | Admit: 2012-09-01 | Discharge: 2012-09-01 | Disposition: A | Discharge status: Home / Self Care | Payer: Self-pay | Source: Home / Self Care | Attending: Family Medicine | Admitting: Family Medicine

## 2012-09-01 DIAGNOSIS — I25810 Atherosclerosis of coronary artery bypass graft(s) without angina pectoris: Secondary | ICD-10-CM

## 2012-09-01 DIAGNOSIS — I10 Essential (primary) hypertension: Secondary | ICD-10-CM

## 2012-09-01 DIAGNOSIS — E785 Hyperlipidemia, unspecified: Secondary | ICD-10-CM

## 2012-09-01 HISTORY — DX: Essential (primary) hypertension: I10

## 2012-09-01 LAB — COMPREHENSIVE METABOLIC PANEL
ALT: 17 U/L (ref 0–35)
AST: 18 U/L (ref 0–37)
Alkaline Phosphatase: 75 U/L (ref 39–117)
CO2: 24 mEq/L (ref 19–32)
GFR calc Af Amer: 71 mL/min — ABNORMAL LOW (ref >90)
GFR calc non Af Amer: 61 mL/min — ABNORMAL LOW (ref >90)
Glucose, Bld: 104 mg/dL — ABNORMAL HIGH (ref 70–99)
Potassium: 4 mEq/L (ref 3.5–5.1)
Sodium: 140 mEq/L (ref 135–145)

## 2012-09-01 LAB — HEMOGLOBIN A1C
Hgb A1c MFr Bld: 7.7 % — ABNORMAL HIGH (ref <5.7)
Mean Plasma Glucose: 174 mg/dL — ABNORMAL HIGH (ref <117)

## 2012-09-01 LAB — LIPID PANEL
HDL: 36 mg/dL — ABNORMAL LOW (ref >39)
LDL Cholesterol: 131 mg/dL — ABNORMAL HIGH (ref 0–99)
Total CHOL/HDL Ratio: 5.5 RATIO

## 2012-09-01 MED ORDER — CLOPIDOGREL BISULFATE 75 MG PO TABS: 75.0000 mg | ORAL_TABLET | Freq: Every day | ORAL | Status: DC

## 2012-09-01 MED ORDER — FLUTICASONE PROPIONATE HFA 110 MCG/ACT IN AERO: 1.0000 | INHALATION_SPRAY | Freq: Two times a day (BID) | RESPIRATORY_TRACT | Status: DC | PRN

## 2012-09-01 MED ORDER — ATORVASTATIN CALCIUM 80 MG PO TABS: 80.0000 mg | ORAL_TABLET | Freq: Every day | ORAL | Status: DC

## 2012-09-01 MED ORDER — EZETIMIBE 10 MG PO TABS: 10.0000 mg | ORAL_TABLET | Freq: Every day | ORAL | Status: DC

## 2012-09-01 MED ORDER — METOPROLOL SUCCINATE ER 50 MG PO TB24: 50.0000 mg | ORAL_TABLET | Freq: Every day | ORAL | Status: DC

## 2012-09-01 MED ORDER — QUINAPRIL-HYDROCHLOROTHIAZIDE 20-12.5 MG PO TABS: 1.0000 | ORAL_TABLET | Freq: Every day | ORAL | Status: DC

## 2012-09-01 NOTE — ED Notes (Signed)
[facility] client- needs to establish a doctor, history of DM, hypertension, double by pass surgery

## 2012-09-01 NOTE — ED Provider Notes (Signed)
History    CSN: [id number]  Arrival date & time 09/01/12  1540   First MD Initiated Contact with Patient 09/01/12 1548     Chief Complaint  Patient presents with  . 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  Pt here to have her medications refilled.  Patient says she's been doing well and has no acute complaints at this time.  Past Medical History  Diagnosis Date  . Hypertension   . Diabetes mellitus without complication     Past Surgical History  Procedure Date  . Cardiac surgery     No family history on file.  History  Substance Use Topics  . Smoking status: Not on file  . Smokeless tobacco: Not on file  . Alcohol Use:     OB History    Grav Para Term Preterm Abortions TAB SAB Ect Mult Living                 Review of Systems  Constitutional: Negative.   HENT: Negative.   Eyes: Negative.   Respiratory: Negative.   Cardiovascular: Negative.   Gastrointestinal: Negative.   Musculoskeletal: Negative.   Neurological: Negative.   Hematological: Negative.   Psychiatric/Behavioral: Negative.   All other systems reviewed and are negative.    Allergies  Shellfish allergy  Home Medications   Current Outpatient Rx  Name  Route  Sig  Dispense  Refill  . ASPIRIN 81 MG PO CHEW   Oral   Chew 81 mg by mouth daily.         . ATORVASTATIN CALCIUM 80 MG PO TABS   Oral   Take 80 mg by mouth daily.         Marland Kitchen CLOPIDOGREL BISULFATE 75 MG PO TABS   Oral   Take 75 mg by mouth daily.         Marland Kitchen EZETIMIBE 10 MG PO TABS   Oral   Take 10 mg by mouth daily.         Marland Kitchen FLUTICASONE PROPIONATE  HFA 110 MCG/ACT IN AERO   Inhalation   Inhale 1 puff into the lungs 2 (two) times daily as needed.         Marland Kitchen METOPROLOL SUCCINATE ER 50 MG PO TB24   Oral   Take 50 mg by mouth daily. Take with or immediately following a meal.         . QUINAPRIL-HYDROCHLOROTHIAZIDE 20-12.5 MG PO TABS   Oral   Take 1  tablet by mouth daily.           BP 140/78  Pulse 78  Temp 98.2 F (36.8 C) (Oral)  Resp 17  SpO2 99%  Physical Exam  Nursing note and vitals reviewed. Constitutional: She is oriented to person, place, and time. She appears well-developed and well-nourished. No distress.  HENT:  Head: Normocephalic and atraumatic.  Eyes: EOM are normal. Pupils are equal, round, and reactive to light.  Neck: Normal range of motion. Neck supple.  Cardiovascular: Normal rate, regular rhythm and normal heart sounds.   Pulmonary/Chest: Effort normal and breath sounds normal.  Abdominal: Soft. Bowel sounds are normal.  Musculoskeletal: Normal range of motion. She exhibits no edema and no tenderness.  Neurological: She is alert and oriented to person, place, and time.  Skin: Skin is warm and dry.  Psychiatric: She has a normal mood and affect. Her behavior is normal. Judgment and thought  content normal.    ED Course  Procedures (including critical care time)  Labs Reviewed - No data to display No results found.  No diagnosis found.  MDM  IMPRESSION  Hypertension  Hyperlipidemia  CAD   RECOMMENDATIONS / PLAN Refill medications today. Ordered labs we'll followup on those results.  FOLLOW UP   The patient was given clear instructions to go to ER or return to medical center if symptoms don't improve, worsen or new problems develop.  The patient verbalized understanding.  The patient was told to call to get lab results if they haven't heard anything in the next week.            Cleora Fleet, MD 09/01/12 [phone]

## 2012-09-03 ENCOUNTER — Encounter: Payer: Self-pay | Admitting: Family Medicine

## 2012-09-03 NOTE — Progress Notes (Signed)
Quick Note:  Please notify patient that her cholesterol is suboptimally controlled with her LDL being greater than 100. Also her diabetes mellitus is out of control as evidenced by a Hemoglobin A1c of 7.7%. Please clarify what treatment for diabetes the patient is taking and let me know. Please have patient to take her cholesterol medications as prescribed. Please have patient test her blood glucose and call us in 2 weeks with her blood glucose readings.   Rodney Langton, MD, CDE, FAAFP [facility] [place], [place]   ______

## 2012-09-06 ENCOUNTER — Telehealth (HOSPITAL_COMMUNITY): Payer: Self-pay

## 2012-09-06 NOTE — Telephone Encounter (Signed)
Message copied by Lestine Mount on Tue Sep 06, 2012  3:39 PM ------      Message from: Cleora Fleet      Created: Sat Sep 03, 2012  7:15 PM       Please notify patient that her cholesterol is suboptimally controlled with her LDL being greater than 100.  Also her diabetes mellitus is out of control as evidenced by a Hemoglobin A1c of 7.7%.  Please clarify what treatment for diabetes the patient is taking and let me know.  Please have patient to take her cholesterol medications as prescribed.  Please have patient test her blood glucose and call us in 2 weeks with her blood glucose readings.              Rodney Langton, MD, CDE, FAAFP      [facility]      [facility]      [place], [place]

## 2013-05-04 ENCOUNTER — Telehealth: Payer: Self-pay | Admitting: Emergency Medicine

## 2013-06-16 ENCOUNTER — Ambulatory Visit: Payer: No Typology Code available for payment source | Attending: Internal Medicine

## 2013-07-05 ENCOUNTER — Encounter: Payer: Self-pay | Admitting: Internal Medicine

## 2013-07-05 ENCOUNTER — Ambulatory Visit: Payer: No Typology Code available for payment source | Attending: Internal Medicine | Admitting: Internal Medicine

## 2013-07-05 ENCOUNTER — Telehealth: Payer: Self-pay | Admitting: *Deleted

## 2013-07-05 VITALS — BP 167/84 | HR 99 | Temp 98.9°F | Resp 14 | Ht 65.0 in | Wt 146.0 lb

## 2013-07-05 DIAGNOSIS — R0602 Shortness of breath: Secondary | ICD-10-CM | POA: Insufficient documentation

## 2013-07-05 DIAGNOSIS — E119 Type 2 diabetes mellitus without complications: Secondary | ICD-10-CM

## 2013-07-05 DIAGNOSIS — I10 Essential (primary) hypertension: Secondary | ICD-10-CM

## 2013-07-05 LAB — LIPID PANEL
HDL: 30 mg/dL — ABNORMAL LOW (ref >39)
LDL Cholesterol: 58 mg/dL (ref 0–99)
Triglycerides: 95 mg/dL (ref <150)
VLDL: 19 mg/dL (ref 0–40)

## 2013-07-05 LAB — COMPLETE METABOLIC PANEL WITH GFR
ALT: 30 U/L (ref 0–35)
AST: 23 U/L (ref 0–37)
Albumin: 4.3 g/dL (ref 3.5–5.2)
BUN: 16 mg/dL (ref 6–23)
CO2: 24 mEq/L (ref 19–32)
Calcium: 9.3 mg/dL (ref 8.4–10.5)
Chloride: 108 mEq/L (ref 96–112)
Creat: 0.94 mg/dL (ref 0.50–1.10)
GFR, Est Non African American: 64 mL/min
Potassium: 3.6 mEq/L (ref 3.5–5.3)
Sodium: 140 mEq/L (ref 135–145)
Total Protein: 7.2 g/dL (ref 6.0–8.3)

## 2013-07-05 LAB — CBC WITH DIFFERENTIAL/PLATELET
Basophils Absolute: 0 10*3/uL (ref 0.0–0.1)
Eosinophils Absolute: 0.2 10*3/uL (ref 0.0–0.7)
Eosinophils Relative: 3 % (ref 0–5)
HCT: 34.1 % — ABNORMAL LOW (ref 36.0–46.0)
Hemoglobin: 11.2 g/dL — ABNORMAL LOW (ref 12.0–15.0)
Lymphocytes Relative: 25 % (ref 12–46)
MCHC: 32.8 g/dL (ref 30.0–36.0)
MCV: 90 fL (ref 78.0–100.0)
Monocytes Absolute: 0.7 10*3/uL (ref 0.1–1.0)
Monocytes Relative: 10 % (ref 3–12)
Neutro Abs: 4.3 10*3/uL (ref 1.7–7.7)
RDW: 15.7 % — ABNORMAL HIGH (ref 11.5–15.5)

## 2013-07-05 LAB — HEMOGLOBIN A1C
Hgb A1c MFr Bld: 7 % — ABNORMAL HIGH (ref <5.7)
Mean Plasma Glucose: 154 mg/dL — ABNORMAL HIGH (ref <117)

## 2013-07-05 MED ORDER — ATORVASTATIN CALCIUM 80 MG PO TABS: 80.0000 mg | ORAL_TABLET | Freq: Every day | ORAL | Status: DC

## 2013-07-05 MED ORDER — FUROSEMIDE 40 MG PO TABS: 40.0000 mg | ORAL_TABLET | Freq: Every day | ORAL | Status: DC

## 2013-07-05 MED ORDER — LISINOPRIL 20 MG PO TABS: 20.0000 mg | ORAL_TABLET | Freq: Every day | ORAL | Status: DC

## 2013-07-05 MED ORDER — EZETIMIBE 10 MG PO TABS: 10.0000 mg | ORAL_TABLET | Freq: Every day | ORAL | Status: DC

## 2013-07-05 MED ORDER — ALBUTEROL SULFATE HFA 108 (90 BASE) MCG/ACT IN AERS: 2.0000 | INHALATION_SPRAY | Freq: Four times a day (QID) | RESPIRATORY_TRACT | Status: DC | PRN

## 2013-07-05 MED ORDER — LORATADINE 10 MG PO TABS: 10.0000 mg | ORAL_TABLET | Freq: Every day | ORAL | Status: DC

## 2013-07-05 MED ORDER — ASPIRIN 81 MG PO CHEW: 81.0000 mg | CHEWABLE_TABLET | Freq: Every day | ORAL | Status: AC

## 2013-07-05 MED ORDER — FLUTICASONE PROPIONATE HFA 110 MCG/ACT IN AERO: 1.0000 | INHALATION_SPRAY | Freq: Two times a day (BID) | RESPIRATORY_TRACT | Status: DC | PRN

## 2013-07-05 MED ORDER — CLOPIDOGREL BISULFATE 75 MG PO TABS: 75.0000 mg | ORAL_TABLET | Freq: Every day | ORAL | Status: DC

## 2013-07-05 MED ORDER — METOPROLOL SUCCINATE ER 50 MG PO TB24: 50.0000 mg | ORAL_TABLET | Freq: Every day | ORAL | Status: DC

## 2013-07-05 NOTE — Progress Notes (Signed)
Pt is here for an office visit. Pt requests a physical, check up and medication refill. Pt complains of breathing problems x1 month; gasping for air, on and off, using inhaler (hasn't worked).

## 2013-07-05 NOTE — Progress Notes (Signed)
Patient ID: Rachel Oneill, female   DOB: October 31, 1950, 62 y.o.   MRN: [id number]   CC:  HPI: 62 year old female who is here for evaluation of her shortness of breath. The patient has occlusive coronary artery disease, previously had a drug-eluting stent in the circumflex coronary artery. In September 2007 the patient was diagnosed with acute pulmonary edema, placed on diuretics and BiPAP. She has had multiple cardiac catheterizations in 2004, 2006, 2007 and TEE in September 2007 showed moderate mitral regurgitation Previous cardiac echo showed an EF of 40-45%. She had a cardiac bypass surgery in October of 2007. She had bypass grafting x2 and mitral valve repair with [employer] model 4100 annuloplasty  ring, size 26 mm, serial number [id number]. She has previously seen Dr. Jacinto Halim, but has not been able to see him back for the last several years because of lack of insurance  She also history of type 2 diabetes with last known hemoglobin A1c of 7.7.  She also has a history of hypertension dyslipidemia and has not been taking any of her medications for the last 2 months because of lack of insurance She complains of dyspnea on exertion. She works in [employer] and has to stop gasping for breath She denies any dependent edema but is very short winded when she is singing in the choir and when she is working  Allergies  Allergen Reactions  . Shellfish Allergy    Past Medical History  Diagnosis Date  . Hypertension   . Uncontrolled diabetes mellitus with complications    No current outpatient prescriptions on file prior to visit.   No current facility-administered medications on file prior to visit.   No family history on file. History   Social History  . Marital Status: Legally Separated    Spouse Name: N/A    Number of Children: N/A  . Years of Education: N/A   Occupational History  . Not on file.   Social History Main Topics  . Smoking status: Never Smoker   . Smokeless  tobacco: Not on file  . Alcohol Use: No  . Drug Use: No  . Sexual Activity: Not on file   Other Topics Concern  . Not on file   Social History Narrative  . No narrative on file    Review of Systems  Constitutional: Negative for fever, chills, diaphoresis, activity change, appetite change and fatigue.  HENT: Negative for ear pain, nosebleeds, congestion, facial swelling, rhinorrhea, neck pain, neck stiffness and ear discharge.   Eyes: Negative for pain, discharge, redness, itching and visual disturbance.  Respiratory: Negative for cough, choking, chest tightness, shortness of breath, wheezing and stridor.   Cardiovascular: Negative for chest pain, palpitations and leg swelling.  Gastrointestinal: Negative for abdominal distention.  Genitourinary: Negative for dysuria, urgency, frequency, hematuria, flank pain, decreased urine volume, difficulty urinating and dyspareunia.  Musculoskeletal: Negative for back pain, joint swelling, arthralgias and gait problem.  Neurological: Negative for dizziness, tremors, seizures, syncope, facial asymmetry, speech difficulty, weakness, light-headedness, numbness and headaches.  Hematological: Negative for adenopathy. Does not bruise/bleed easily.  Psychiatric/Behavioral: Negative for hallucinations, behavioral problems, confusion, dysphoric mood, decreased concentration and agitation.    Objective:   Filed Vitals:   07/05/13 0934  BP: 167/84  Pulse: 99  Temp: 98.9 F (37.2 C)  Resp: 14    Physical Exam  Constitutional: Appears well-developed and well-nourished. No distress.  HENT: Normocephalic. External right and left ear normal. Oropharynx is clear and moist.  Eyes: Conjunctivae  and EOM are normal. PERRLA, no scleral icterus.  Neck: Normal ROM. Neck supple. No JVD. No tracheal deviation. No thyromegaly.  CVS: RRR, S1/S2 +, no murmurs, no gallops, no carotid bruit.  Pulmonary: Effort and breath sounds normal, no stridor, rhonchi, wheezes,  rales.  Abdominal: Soft. BS +,  no distension, tenderness, rebound or guarding.  Musculoskeletal: Normal range of motion. No edema and no tenderness.  Lymphadenopathy: No lymphadenopathy noted, cervical, inguinal. Neuro: Alert. Normal reflexes, muscle tone coordination. No cranial nerve deficit. Skin: Skin is warm and dry. No rash noted. Not diaphoretic. No erythema. No pallor.  Psychiatric: Normal mood and affect. Behavior, judgment, thought content normal.   Lab Results  Component Value Date   WBC 6.8 03/11/2011   HGB 12.3 03/11/2011   HCT 36.7 03/11/2011   MCV 85.5 03/11/2011   PLT 193 03/11/2011   Lab Results  Component Value Date   CREATININE 0.97 09/01/2012   BUN 17 09/01/2012   NA 140 09/01/2012   K 4.0 09/01/2012   CL 104 09/01/2012   CO2 24 09/01/2012    Lab Results  Component Value Date   HGBA1C 7.7* 09/01/2012   Lipid Panel     Component Value Date/Time   CHOL 199 09/01/2012 1616   TRIG 161* 09/01/2012 1616   HDL 36* 09/01/2012 1616   CHOLHDL 5.5 09/01/2012 1616   VLDL 32 09/01/2012 1616   LDLCALC 131* 09/01/2012 1616       Assessment and plan:   There are no active problems to display for this patient.   history shortness of breath, combination of systolic diastolic heart failure? Worse in the last 2 months Extensive cardiac history We'll refer to cardiology 2-D echo, chest x-ray Obtain pro BNP Continue ACE inhibitor, beta blocker start the patient on Lasix Baseline labs including hemoglobin A1c, lipid panel, kidney function Need close followup, followup in one month   The patient was given clear instructions to go to ER or return to medical center if symptoms don't improve, worsen or new problems develop. The patient verbalized understanding. The patient was told to call to get any lab results if not heard anything in the next week.

## 2013-07-05 NOTE — Telephone Encounter (Signed)
Called pt to inform her of scheduled CT and Echo. Pt accepted and acknowledged what was informed.

## 2013-07-06 LAB — TSH: TSH: 1.859 u[IU]/mL (ref 0.350–4.500)

## 2013-07-12 ENCOUNTER — Encounter: Payer: Self-pay | Admitting: Cardiology

## 2013-07-12 ENCOUNTER — Ambulatory Visit: Payer: No Typology Code available for payment source | Attending: Cardiology | Admitting: Cardiology

## 2013-07-12 VITALS — BP 126/80 | HR 73 | Temp 98.9°F | Resp 14 | Ht 64.0 in

## 2013-07-12 DIAGNOSIS — I25810 Atherosclerosis of coronary artery bypass graft(s) without angina pectoris: Secondary | ICD-10-CM | POA: Insufficient documentation

## 2013-07-12 DIAGNOSIS — I059 Rheumatic mitral valve disease, unspecified: Secondary | ICD-10-CM

## 2013-07-12 DIAGNOSIS — R0602 Shortness of breath: Secondary | ICD-10-CM | POA: Insufficient documentation

## 2013-07-12 DIAGNOSIS — E1121 Type 2 diabetes mellitus with diabetic nephropathy: Secondary | ICD-10-CM | POA: Insufficient documentation

## 2013-07-12 DIAGNOSIS — E119 Type 2 diabetes mellitus without complications: Secondary | ICD-10-CM

## 2013-07-12 DIAGNOSIS — I10 Essential (primary) hypertension: Secondary | ICD-10-CM | POA: Insufficient documentation

## 2013-07-12 DIAGNOSIS — E785 Hyperlipidemia, unspecified: Secondary | ICD-10-CM

## 2013-07-12 DIAGNOSIS — I340 Nonrheumatic mitral (valve) insufficiency: Secondary | ICD-10-CM | POA: Insufficient documentation

## 2013-07-12 NOTE — Progress Notes (Signed)
Pt is here to f/u Dr. Daleen Squibb for Hx HTN,

## 2013-07-12 NOTE — Assessment & Plan Note (Signed)
Status post ring annuloplasty 2007.

## 2013-07-12 NOTE — Assessment & Plan Note (Signed)
Stable by history and exam. Blood work is acceptable with good blood pressure control. Continue current medications and see me back in 3 months.

## 2013-07-12 NOTE — Progress Notes (Signed)
HPI Rachel Oneill is a 62 year old black female referred by Dr.Abrol to establish with me as her cardiologist.  She has a history of severe coronary artery disease status post left circumflex stent, subsequent history of coronary bypass grafting with mitral valve annuloplasty. Her last echocardiogram in 2008 showed ejection fraction 40-45%, mild left ventricular dilatation, stable mitral valve except for mild mitral valve stenosis, markedly enlarged left atrium, right atrial dilatation, no obvious pulmonary per tension.  She was very short of breath with any activity prior to Thanksgiving. She better medications for 4 weeks except for her Lipitor. We obtained an orange card and she's been taking her medicines every day for the last 2 weeks. Her shortness of breath has totally resolved. She feels much better. She denies orthopnea, PND or edema. She's having no angina. She no longer smokes. Blood work on last visit demonstrated hemoglobin A1c of 7.0%, LDL at goal, normal TSH and normal blood work otherwise.  Past Medical History  Diagnosis Date  . Hypertension   . Uncontrolled diabetes mellitus with complications     Current Outpatient Prescriptions  Medication Sig Dispense Refill  . albuterol (PROVENTIL HFA;VENTOLIN HFA) 108 (90 BASE) MCG/ACT inhaler Inhale 2 puffs into the lungs every 6 (six) hours as needed for wheezing or shortness of breath.  1 Inhaler  0  . aspirin 81 MG chewable tablet Chew 1 tablet (81 mg total) by mouth daily.  60 tablet  2  . atorvastatin (LIPITOR) 80 MG tablet Take 1 tablet (80 mg total) by mouth daily.  90 tablet  2  . clopidogrel (PLAVIX) 75 MG tablet Take 1 tablet (75 mg total) by mouth daily.  90 tablet  2  . ezetimibe (ZETIA) 10 MG tablet Take 1 tablet (10 mg total) by mouth daily.  90 tablet  2  . fluticasone (FLOVENT HFA) 110 MCG/ACT inhaler Inhale 1 puff into the lungs 2 (two) times daily as needed.  3 Inhaler  2  . furosemide (LASIX) 40 MG tablet Take 1 tablet (40  mg total) by mouth daily.  60 tablet  3  . glipiZIDE (GLUCOTROL) 5 MG tablet Take 5 mg by mouth daily before breakfast.      . lisinopril (PRINIVIL,ZESTRIL) 20 MG tablet Take 1 tablet (20 mg total) by mouth daily.  60 tablet  2  . loratadine (CLARITIN) 10 MG tablet Take 1 tablet (10 mg total) by mouth daily.  60 tablet  11  . metoprolol succinate (TOPROL-XL) 50 MG 24 hr tablet Take 1 tablet (50 mg total) by mouth daily. Take with or immediately following a meal.  90 tablet  2   No current facility-administered medications for this visit.    Allergies  Allergen Reactions  . Shellfish Allergy     History reviewed. No pertinent family history.  History   Social History  . Marital Status: Legally Separated    Spouse Name: N/A    Number of Children: N/A  . Years of Education: N/A   Occupational History  . Not on file.   Social History Main Topics  . Smoking status: Never Smoker   . Smokeless tobacco: Not on file  . Alcohol Use: No  . Drug Use: No  . Sexual Activity: Not on file   Other Topics Concern  . Not on file   Social History Narrative  . No narrative on file    ROS ALL NEGATIVE EXCEPT THOSE NOTED IN HPI  PE  General Appearance: well developed, well nourished in no  acute distress, looks older than stated age HEENT: symmetrical face, PERRLA,  Neck: no JVD, thyromegaly, or adenopathy, trachea midline Chest: symmetric without deformity Cardiac: PMI non-displaced, RRR, normal S1, S2, no gallop or murmur Lung: clear to ausculation and percussion Vascular: all pulses full without bruits  Abdominal: nondistended, nontender, good bowel sounds, no HSM, no bruits Extremities: no cyanosis, clubbing or edema, no sign of DVT, no varicosities  Skin: normal color, no rashes Neuro: alert and oriented x 3, non-focal Pysch: normal affect  EKG Normal sinus rhythm, left atrial enlargement, ST-T wave changes laterally. BMET    Component Value Date/Time   NA 140 07/05/2013  0955   K 3.6 07/05/2013 0955   CL 108 07/05/2013 0955   CO2 24 07/05/2013 0955   GLUCOSE 121* 07/05/2013 0955   BUN 16 07/05/2013 0955   CREATININE 0.94 07/05/2013 0955   CREATININE 0.97 09/01/2012 1616   CALCIUM 9.3 07/05/2013 0955   GFRNONAA 61* 09/01/2012 1616   GFRAA 71* 09/01/2012 1616    Lipid Panel     Component Value Date/Time   CHOL 107 07/05/2013 0955   TRIG 95 07/05/2013 0955   HDL 30* 07/05/2013 0955   CHOLHDL 3.6 07/05/2013 0955   VLDL 19 07/05/2013 0955   LDLCALC 58 07/05/2013 0955    CBC    Component Value Date/Time   WBC 6.9 07/05/2013 0955   RBC 3.79* 07/05/2013 0955   HGB 11.2* 07/05/2013 0955   HCT 34.1* 07/05/2013 0955   PLT 209 07/05/2013 0955   MCV 90.0 07/05/2013 0955   MCH 29.6 07/05/2013 0955   MCHC 32.8 07/05/2013 0955   RDW 15.7* 07/05/2013 0955   LYMPHSABS 1.7 07/05/2013 0955   MONOABS 0.7 07/05/2013 0955   EOSABS 0.2 07/05/2013 0955   BASOSABS 0.0 07/05/2013 0955

## 2013-07-13 ENCOUNTER — Other Ambulatory Visit (HOSPITAL_COMMUNITY): Payer: No Typology Code available for payment source

## 2013-07-13 ENCOUNTER — Ambulatory Visit (HOSPITAL_COMMUNITY): Payer: No Typology Code available for payment source

## 2013-07-19 ENCOUNTER — Other Ambulatory Visit: Payer: Self-pay | Admitting: Internal Medicine

## 2013-07-19 MED ORDER — ALBUTEROL SULFATE HFA 108 (90 BASE) MCG/ACT IN AERS: 2.0000 | INHALATION_SPRAY | Freq: Four times a day (QID) | RESPIRATORY_TRACT | Status: DC | PRN

## 2013-07-19 MED ORDER — EZETIMIBE 10 MG PO TABS: 10.0000 mg | ORAL_TABLET | Freq: Every day | ORAL | Status: DC

## 2013-07-19 MED ORDER — FLUTICASONE PROPIONATE HFA 110 MCG/ACT IN AERO: 1.0000 | INHALATION_SPRAY | Freq: Two times a day (BID) | RESPIRATORY_TRACT | Status: DC | PRN

## 2013-07-21 ENCOUNTER — Other Ambulatory Visit: Payer: Self-pay | Admitting: Internal Medicine

## 2013-07-21 MED ORDER — EZETIMIBE 10 MG PO TABS: 10.0000 mg | ORAL_TABLET | Freq: Every day | ORAL | Status: DC

## 2013-08-01 ENCOUNTER — Ambulatory Visit (HOSPITAL_COMMUNITY)
Admission: RE | Admit: 2013-08-01 | Discharge: 2013-08-01 | Disposition: A | Discharge status: Home / Self Care | Payer: No Typology Code available for payment source | Source: Ambulatory Visit | Attending: Internal Medicine | Admitting: Internal Medicine

## 2013-08-01 DIAGNOSIS — E119 Type 2 diabetes mellitus without complications: Secondary | ICD-10-CM | POA: Insufficient documentation

## 2013-08-01 DIAGNOSIS — I10 Essential (primary) hypertension: Secondary | ICD-10-CM | POA: Insufficient documentation

## 2013-08-01 DIAGNOSIS — I059 Rheumatic mitral valve disease, unspecified: Secondary | ICD-10-CM

## 2013-08-01 DIAGNOSIS — R0609 Other forms of dyspnea: Secondary | ICD-10-CM | POA: Insufficient documentation

## 2013-08-01 DIAGNOSIS — I2510 Atherosclerotic heart disease of native coronary artery without angina pectoris: Secondary | ICD-10-CM | POA: Insufficient documentation

## 2013-08-01 DIAGNOSIS — R0989 Other specified symptoms and signs involving the circulatory and respiratory systems: Secondary | ICD-10-CM | POA: Insufficient documentation

## 2013-08-01 NOTE — Progress Notes (Signed)
  Echocardiogram 2D Echocardiogram has been performed.  Rachel Oneill FRANCES 08/01/2013, 10:01 AM

## 2013-08-07 ENCOUNTER — Ambulatory Visit: Payer: No Typology Code available for payment source | Admitting: Internal Medicine

## 2013-08-16 ENCOUNTER — Encounter: Payer: Self-pay | Admitting: Internal Medicine

## 2013-08-16 ENCOUNTER — Ambulatory Visit: Payer: No Typology Code available for payment source | Attending: Internal Medicine | Admitting: Internal Medicine

## 2013-08-16 VITALS — BP 167/67 | HR 120 | Temp 98.9°F | Resp 14 | Ht 66.0 in | Wt 146.8 lb

## 2013-08-16 DIAGNOSIS — I428 Other cardiomyopathies: Secondary | ICD-10-CM | POA: Insufficient documentation

## 2013-08-16 DIAGNOSIS — J209 Acute bronchitis, unspecified: Secondary | ICD-10-CM | POA: Insufficient documentation

## 2013-08-16 LAB — POCT INFLUENZA A/B
INFLUENZA A, POC: NEGATIVE
Influenza B, POC: NEGATIVE

## 2013-08-16 LAB — POCT RAPID STREP A (OFFICE): Rapid Strep A Screen: NEGATIVE

## 2013-08-16 MED ORDER — GUAIFENESIN-DM 100-10 MG/5ML PO SYRP: 5.0000 mL | ORAL_SOLUTION | ORAL | Status: DC | PRN

## 2013-08-16 MED ORDER — AMOXICILLIN-POT CLAVULANATE 875-125 MG PO TABS: 1.0000 | ORAL_TABLET | Freq: Two times a day (BID) | ORAL | Status: DC

## 2013-08-16 MED ORDER — LORATADINE 10 MG PO TABS: 10.0000 mg | ORAL_TABLET | Freq: Every day | ORAL | Status: DC

## 2013-08-16 NOTE — Progress Notes (Signed)
Patient ID: Rachel Oneill, female   DOB: 1951-05-15, 63 y.o.   MRN: [id number]   CC:  HPI:  63 year old female with history of severe coronary artery disease, cardiomyopathy with an EF of 20-25% on her last echocardiogram on 12/23, but shows diffuse hypokinesis, presents today with chief complaint of cough congestion sore throat for the last couple of days. The patient denies any fever chills, chest pain, shortness of breath, peripheral edema She has had multiple sick contacts as she works in [employer] She has not tried anything over-the-counter   Allergies  Allergen Reactions  . Shellfish Allergy    Past Medical History  Diagnosis Date  . Hypertension   . Uncontrolled diabetes mellitus with complications    Current Outpatient Prescriptions on File Prior to Visit  Medication Sig Dispense Refill  . albuterol (PROVENTIL HFA;VENTOLIN HFA) 108 (90 BASE) MCG/ACT inhaler Inhale 2 puffs into the lungs every 6 (six) hours as needed for wheezing or shortness of breath.  3 Inhaler  1 year  . aspirin 81 MG chewable tablet Chew 1 tablet (81 mg total) by mouth daily.  60 tablet  2  . atorvastatin (LIPITOR) 80 MG tablet Take 1 tablet (80 mg total) by mouth daily.  90 tablet  2  . clopidogrel (PLAVIX) 75 MG tablet Take 1 tablet (75 mg total) by mouth daily.  90 tablet  2  . ezetimibe (ZETIA) 10 MG tablet Take 1 tablet (10 mg total) by mouth daily.  90 tablet  4  . fluticasone (FLOVENT HFA) 110 MCG/ACT inhaler Inhale 1 puff into the lungs 2 (two) times daily as needed.  3 Inhaler  1 year  . furosemide (LASIX) 40 MG tablet Take 1 tablet (40 mg total) by mouth daily.  60 tablet  3  . glipiZIDE (GLUCOTROL) 5 MG tablet Take 5 mg by mouth daily before breakfast.      . lisinopril (PRINIVIL,ZESTRIL) 20 MG tablet Take 1 tablet (20 mg total) by mouth daily.  60 tablet  2  . loratadine (CLARITIN) 10 MG tablet Take 1 tablet (10 mg total) by mouth daily.  60 tablet  11  . metoprolol succinate  (TOPROL-XL) 50 MG 24 hr tablet Take 1 tablet (50 mg total) by mouth daily. Take with or immediately following a meal.  90 tablet  2   No current facility-administered medications on file prior to visit.   No family history on file. History   Social History  . Marital Status: Legally Separated    Spouse Name: N/A    Number of Children: N/A  . Years of Education: N/A   Occupational History  . Not on file.   Social History Main Topics  . Smoking status: Never Smoker   . Smokeless tobacco: Not on file  . Alcohol Use: No  . Drug Use: No  . Sexual Activity: Not on file   Other Topics Concern  . Not on file   Social History Narrative  . No narrative on file    Review of Systems  Constitutional: Negative for fever, chills, diaphoresis, activity change, appetite change and fatigue.  HENT: Negative for ear pain, nosebleeds, congestion, facial swelling, rhinorrhea, neck pain, neck stiffness and ear discharge.   Eyes: Negative for pain, discharge, redness, itching and visual disturbance.  Respiratory: Negative for cough, choking, chest tightness, shortness of breath, wheezing and stridor.   Cardiovascular: Negative for chest pain, palpitations and leg swelling.  Gastrointestinal: Negative for abdominal distention.  Genitourinary: Negative for dysuria,  urgency, frequency, hematuria, flank pain, decreased urine volume, difficulty urinating and dyspareunia.  Musculoskeletal: Negative for back pain, joint swelling, arthralgias and gait problem.  Neurological: Negative for dizziness, tremors, seizures, syncope, facial asymmetry, speech difficulty, weakness, light-headedness, numbness and headaches.  Hematological: Negative for adenopathy. Does not bruise/bleed easily.  Psychiatric/Behavioral: Negative for hallucinations, behavioral problems, confusion, dysphoric mood, decreased concentration and agitation.    Objective:   Filed Vitals:   08/16/13 1610  BP: 167/67  Pulse: 120  Temp:  98.9 F (37.2 C)  Resp: 14    Physical Exam  Constitutional: Appears well-developed and well-nourished. No distress.  HENT: Normocephalic. External right and left ear normal. Oropharynx is clear and moist.  Eyes: Conjunctivae and EOM are normal. PERRLA, no scleral icterus.  Neck: Normal ROM. Neck supple. No JVD. No tracheal deviation. No thyromegaly.  CVS: RRR, S1/S2 +, no murmurs, no gallops, no carotid bruit.  Pulmonary: Effort and breath sounds normal, no stridor, rhonchi, wheezes, rales.  Abdominal: Soft. BS +,  no distension, tenderness, rebound or guarding.  Musculoskeletal: Normal range of motion. No edema and no tenderness.  Lymphadenopathy: No lymphadenopathy noted, cervical, inguinal. Neuro: Alert. Normal reflexes, muscle tone coordination. No cranial nerve deficit. Skin: Skin is warm and dry. No rash noted. Not diaphoretic. No erythema. No pallor.  Psychiatric: Normal mood and affect. Behavior, judgment, thought content normal.   Lab Results  Component Value Date   WBC 6.9 07/05/2013   HGB 11.2* 07/05/2013   HCT 34.1* 07/05/2013   MCV 90.0 07/05/2013   PLT 209 07/05/2013   Lab Results  Component Value Date   CREATININE 0.94 07/05/2013   BUN 16 07/05/2013   NA 140 07/05/2013   K 3.6 07/05/2013   CL 108 07/05/2013   CO2 24 07/05/2013    Lab Results  Component Value Date   HGBA1C 7.0* 07/05/2013   Lipid Panel     Component Value Date/Time   CHOL 107 07/05/2013 0955   TRIG 95 07/05/2013 0955   HDL 30* 07/05/2013 0955   CHOLHDL 3.6 07/05/2013 0955   VLDL 19 07/05/2013 0955   LDLCALC 58 07/05/2013 0955       Assessment and plan:   Patient Active Problem List   Diagnosis Date Noted  . CAD (coronary artery disease) of artery bypass graft 07/12/2013  . Hyperlipidemia LDL goal <100 07/12/2013  . Essential hypertension 07/12/2013  . Type 2 diabetes mellitus without complication 07/12/2013  . Mitral regurgitation 07/12/2013       Cardiomyopathy Patient  advised to continue with current medications Most likely ischemic cardiomyopathy Repeat echo in June If no improvement the patient will need a defibrillator She has a followup with Dr. Daleen SquibbWall in a couple of months   Acute bronchitis Patient prescribed Augmentin, Claritin, Robitussin-DM, Advised to rest and hydrate for 3-4 days Advised to go back to work on 1/12    The patient was given clear instructions to go to ER or return to medical center if symptoms don't improve, worsen or new problems develop. The patient verbalized understanding. The patient was told to call to get any lab results if not heard anything in the next week.

## 2013-08-16 NOTE — Progress Notes (Signed)
Pt is here for a f/u. Complains of dry cough x2 days. Feels like mucus sitting in throat. A little shortness of breath, nasal congestion, head cold.

## 2013-09-04 ENCOUNTER — Ambulatory Visit: Payer: No Typology Code available for payment source | Attending: Internal Medicine

## 2013-10-10 ENCOUNTER — Other Ambulatory Visit: Payer: Self-pay | Admitting: Emergency Medicine

## 2013-10-10 MED ORDER — ATORVASTATIN CALCIUM 80 MG PO TABS: 80.0000 mg | ORAL_TABLET | Freq: Every day | ORAL | Status: AC

## 2013-10-11 ENCOUNTER — Encounter: Payer: Self-pay | Admitting: Cardiology

## 2013-10-11 ENCOUNTER — Ambulatory Visit: Payer: No Typology Code available for payment source | Attending: Cardiology | Admitting: Cardiology

## 2013-10-11 VITALS — BP 125/67 | HR 70 | Temp 98.4°F | Resp 16 | Ht 66.0 in | Wt 141.0 lb

## 2013-10-11 DIAGNOSIS — I059 Rheumatic mitral valve disease, unspecified: Secondary | ICD-10-CM

## 2013-10-11 DIAGNOSIS — I340 Nonrheumatic mitral (valve) insufficiency: Secondary | ICD-10-CM

## 2013-10-11 DIAGNOSIS — I10 Essential (primary) hypertension: Secondary | ICD-10-CM

## 2013-10-11 DIAGNOSIS — I25810 Atherosclerosis of coronary artery bypass graft(s) without angina pectoris: Secondary | ICD-10-CM

## 2013-10-11 DIAGNOSIS — E785 Hyperlipidemia, unspecified: Secondary | ICD-10-CM

## 2013-10-11 DIAGNOSIS — I5022 Chronic systolic (congestive) heart failure: Secondary | ICD-10-CM | POA: Insufficient documentation

## 2013-10-11 DIAGNOSIS — I050 Rheumatic mitral stenosis: Secondary | ICD-10-CM | POA: Insufficient documentation

## 2013-10-11 DIAGNOSIS — I517 Cardiomegaly: Secondary | ICD-10-CM | POA: Insufficient documentation

## 2013-10-11 DIAGNOSIS — E119 Type 2 diabetes mellitus without complications: Secondary | ICD-10-CM

## 2013-10-11 DIAGNOSIS — Z09 Encounter for follow-up examination after completed treatment for conditions other than malignant neoplasm: Secondary | ICD-10-CM | POA: Insufficient documentation

## 2013-10-11 LAB — POCT GLYCOSYLATED HEMOGLOBIN (HGB A1C): HEMOGLOBIN A1C: 6.9

## 2013-10-11 LAB — GLUCOSE, POCT (MANUAL RESULT ENTRY): POC GLUCOSE: 162 mg/dL — AB (ref 70–99)

## 2013-10-11 NOTE — Assessment & Plan Note (Signed)
Her ejection fraction has dropped since her last echocardiogram in 2008. However at that time she was not taking her medications and her blood pressure was under poor control as was her heart rate. These are markedly improved today and on my last visit. She is totally asymptomatic. At this time we'll continue current secondary preventative therapy. Medications have been reviewed. Taking her medications has been reinforced. We will renew her Plavix today. I'll see her back for followup in 6 months. We may repeat an echocardiogram at that time to see if her EF is improved. If not she may need evaluation for possible defibrillator.

## 2013-10-11 NOTE — Progress Notes (Signed)
Ms Zinda returns today for followup. I saw her several months ago. She is doing remarkably well and is very compliant with her program. See my previous note for details. An echocardiogram on 08/01/2013 showed an EF of 25% which is down from her EF of around 40% the time of her surgery. She had mild left ventricular dilatation with diffuse hypokinesia with inferior akinesia. Mitral valve ring was intact with mild mitral stenosis.  She is working full-time for [employer]. She denies any significant dyspnea on exertion, orthopnea, PND. She denies any presyncope or syncope. She's had no palpitations. She's been very compliant with her medications except she did inadvertently run out of her Plavix about 3 weeks ago. We will renew that today in our pharmacy.  Physical exam is stable today. She has no edema.  Laboratory data from November was remarkable for lipids being at goal.  EKG not repeated today.

## 2013-10-11 NOTE — Patient Instructions (Signed)
Pt instructed to continue taking medications and continue good diet/exercise control

## 2013-10-11 NOTE — Progress Notes (Signed)
Pt is here to f/u with Dr. Daleen Squibb  Hx. CAD,Htn and controlled Diabetes Taking medications daily Refills at pharmacy to pick up VSS.denies CP Left arm soreness s/p fall x 1 mnth ago

## 2013-11-07 ENCOUNTER — Encounter (HOSPITAL_COMMUNITY): Payer: Self-pay | Admitting: Emergency Medicine

## 2013-11-07 ENCOUNTER — Emergency Department (HOSPITAL_COMMUNITY)
Admission: EM | Admit: 2013-11-07 | Discharge: 2013-11-07 | Disposition: A | Discharge status: Home / Self Care | Payer: No Typology Code available for payment source | Attending: Emergency Medicine | Admitting: Emergency Medicine

## 2013-11-07 ENCOUNTER — Emergency Department (HOSPITAL_COMMUNITY): Payer: No Typology Code available for payment source

## 2013-11-07 DIAGNOSIS — Z8709 Personal history of other diseases of the respiratory system: Secondary | ICD-10-CM | POA: Insufficient documentation

## 2013-11-07 DIAGNOSIS — Z7902 Long term (current) use of antithrombotics/antiplatelets: Secondary | ICD-10-CM | POA: Insufficient documentation

## 2013-11-07 DIAGNOSIS — Z79899 Other long term (current) drug therapy: Secondary | ICD-10-CM | POA: Insufficient documentation

## 2013-11-07 DIAGNOSIS — Z9119 Patient's noncompliance with other medical treatment and regimen: Secondary | ICD-10-CM | POA: Insufficient documentation

## 2013-11-07 DIAGNOSIS — Z9889 Other specified postprocedural states: Secondary | ICD-10-CM | POA: Insufficient documentation

## 2013-11-07 DIAGNOSIS — E119 Type 2 diabetes mellitus without complications: Secondary | ICD-10-CM | POA: Insufficient documentation

## 2013-11-07 DIAGNOSIS — R0600 Dyspnea, unspecified: Secondary | ICD-10-CM

## 2013-11-07 DIAGNOSIS — Z91199 Patient's noncompliance with other medical treatment and regimen due to unspecified reason: Secondary | ICD-10-CM | POA: Insufficient documentation

## 2013-11-07 DIAGNOSIS — Z792 Long term (current) use of antibiotics: Secondary | ICD-10-CM | POA: Insufficient documentation

## 2013-11-07 DIAGNOSIS — J441 Chronic obstructive pulmonary disease with (acute) exacerbation: Secondary | ICD-10-CM | POA: Insufficient documentation

## 2013-11-07 DIAGNOSIS — R609 Edema, unspecified: Secondary | ICD-10-CM | POA: Insufficient documentation

## 2013-11-07 DIAGNOSIS — I2510 Atherosclerotic heart disease of native coronary artery without angina pectoris: Secondary | ICD-10-CM | POA: Insufficient documentation

## 2013-11-07 DIAGNOSIS — I10 Essential (primary) hypertension: Secondary | ICD-10-CM | POA: Insufficient documentation

## 2013-11-07 DIAGNOSIS — R739 Hyperglycemia, unspecified: Secondary | ICD-10-CM

## 2013-11-07 DIAGNOSIS — Z951 Presence of aortocoronary bypass graft: Secondary | ICD-10-CM | POA: Insufficient documentation

## 2013-11-07 DIAGNOSIS — Z7982 Long term (current) use of aspirin: Secondary | ICD-10-CM | POA: Insufficient documentation

## 2013-11-07 HISTORY — DX: Bronchitis, not specified as acute or chronic: J40

## 2013-11-07 LAB — BASIC METABOLIC PANEL
BUN: 19 mg/dL (ref 6–23)
CALCIUM: 9 mg/dL (ref 8.4–10.5)
CO2: 20 mEq/L (ref 19–32)
Chloride: 107 mEq/L (ref 96–112)
Creatinine, Ser: 1.02 mg/dL (ref 0.50–1.10)
GFR calc Af Amer: 66 mL/min — ABNORMAL LOW (ref >90)
GFR calc non Af Amer: 57 mL/min — ABNORMAL LOW (ref >90)
GLUCOSE: 184 mg/dL — AB (ref 70–99)
POTASSIUM: 3.7 meq/L (ref 3.7–5.3)
Sodium: 142 mEq/L (ref 137–147)

## 2013-11-07 LAB — CBC
HEMATOCRIT: 32.6 % — AB (ref 36.0–46.0)
HEMOGLOBIN: 11.1 g/dL — AB (ref 12.0–15.0)
MCH: 30.7 pg (ref 26.0–34.0)
MCHC: 34 g/dL (ref 30.0–36.0)
MCV: 90.1 fL (ref 78.0–100.0)
Platelets: 162 10*3/uL (ref 150–400)
RBC: 3.62 MIL/uL — ABNORMAL LOW (ref 3.87–5.11)
RDW: 14.6 % (ref 11.5–15.5)
WBC: 9.7 10*3/uL (ref 4.0–10.5)

## 2013-11-07 LAB — I-STAT TROPONIN, ED: Troponin i, poc: 0.01 ng/mL (ref 0.00–0.08)

## 2013-11-07 MED ORDER — FUROSEMIDE 10 MG/ML IJ SOLN
40.0000 mg | INTRAMUSCULAR | Status: AC
  Administered 2013-11-07: 40 mg via INTRAVENOUS
  Filled 2013-11-07: qty 4

## 2013-11-07 MED ORDER — ALBUTEROL SULFATE HFA 108 (90 BASE) MCG/ACT IN AERS: 2.0000 | INHALATION_SPRAY | Freq: Four times a day (QID) | RESPIRATORY_TRACT | Status: DC | PRN

## 2013-11-07 MED ORDER — ALBUTEROL SULFATE (2.5 MG/3ML) 0.083% IN NEBU
5.0000 mg | INHALATION_SOLUTION | Freq: Once | RESPIRATORY_TRACT | Status: AC
  Administered 2013-11-07: 5 mg via RESPIRATORY_TRACT
  Filled 2013-11-07: qty 6

## 2013-11-07 MED ORDER — FUROSEMIDE 10 MG/ML IJ SOLN: 40.0000 mg | INTRAMUSCULAR | Status: DC

## 2013-11-07 MED ORDER — FLUTICASONE PROPIONATE HFA 110 MCG/ACT IN AERO: 1.0000 | INHALATION_SPRAY | Freq: Two times a day (BID) | RESPIRATORY_TRACT | Status: DC | PRN

## 2013-11-07 MED ORDER — FUROSEMIDE 40 MG PO TABS: 40.0000 mg | ORAL_TABLET | Freq: Every day | ORAL | Status: DC

## 2013-11-07 NOTE — Discharge Instructions (Signed)
Call for a follow up appointment with a Family or Primary Care Provider.  Call your cardiologist for a follow up appointment for further evaluation of you hypertension and cardiac disease.  Return if Symptoms worsen.   Take medication as prescribed.

## 2013-11-07 NOTE — ED Provider Notes (Signed)
CSN: [id number]     Arrival date & time 11/07/13  0631 History   First MD Initiated Contact with Patient 11/07/13 [phone]     Chief Complaint  Patient presents with  . Cough  . Wheezing     (Consider location/radiation/quality/duration/timing/severity/associated sxs/prior Treatment) HPI Comments: Rachel Oneill is a 63 y.o. female with a past medical history of DM, HTN, Bronchitis, CAD s/p CABG presenting the Emergency Department with a chief complaint of dyspnea since this morning.  She reports waking up at 0200 due to SOB, and cough.  The patient denies chest pain.  She reports she has been non-compliant with her lasix for the past 4 days.  The patient denies home oxygen use. PCP: [facility]  The history is provided by the patient. No language interpreter was used.    Past Medical History  Diagnosis Date  . Hypertension   . Uncontrolled diabetes mellitus with complications   . Bronchitis    Past Surgical History  Procedure Laterality Date  . Cardiac surgery     No family history on file. History  Substance Use Topics  . Smoking status: Never Smoker   . Smokeless tobacco: Not on file  . Alcohol Use: No   OB History   Grav Para Term Preterm Abortions TAB SAB Ect Mult Living                 Review of Systems  Constitutional: Negative for fever and chills.  Respiratory: Positive for cough, shortness of breath and wheezing.   Cardiovascular: Negative for chest pain, palpitations and leg swelling.  Gastrointestinal: Negative for abdominal pain.      Allergies  Shellfish allergy  Home Medications   Current Outpatient Rx  Name  Route  Sig  Dispense  Refill  . EXPIRED: albuterol (PROVENTIL HFA;VENTOLIN HFA) 108 (90 BASE) MCG/ACT inhaler   Inhalation   Inhale 2 puffs into the lungs every 6 (six) hours as needed for wheezing or shortness of breath.   3 Inhaler   1 year   . amoxicillin-clavulanate (AUGMENTIN) 875-125 MG per tablet   Oral   Take 1 tablet by mouth 2 (two)  times daily.   20 tablet   0   . aspirin 81 MG chewable tablet   Oral   Chew 1 tablet (81 mg total) by mouth daily.   60 tablet   2   . atorvastatin (LIPITOR) 80 MG tablet   Oral   Take 1 tablet (80 mg total) by mouth daily.   90 tablet   2   . clopidogrel (PLAVIX) 75 MG tablet   Oral   Take 1 tablet (75 mg total) by mouth daily.   90 tablet   2   . ezetimibe (ZETIA) 10 MG tablet   Oral   Take 1 tablet (10 mg total) by mouth daily.   90 tablet   4   . EXPIRED: fluticasone (FLOVENT HFA) 110 MCG/ACT inhaler   Inhalation   Inhale 1 puff into the lungs 2 (two) times daily as needed.   3 Inhaler   1 year   . furosemide (LASIX) 40 MG tablet   Oral   Take 1 tablet (40 mg total) by mouth daily.   60 tablet   3   . glipiZIDE (GLUCOTROL) 5 MG tablet   Oral   Take 5 mg by mouth daily before breakfast.         . guaiFENesin-dextromethorphan (ROBITUSSIN DM) 100-10 MG/5ML syrup   Oral  Take 5 mLs by mouth every 4 (four) hours as needed for cough.   355 mL   0   . lisinopril (PRINIVIL,ZESTRIL) 20 MG tablet   Oral   Take 1 tablet (20 mg total) by mouth daily.   60 tablet   2   . loratadine (CLARITIN) 10 MG tablet   Oral   Take 1 tablet (10 mg total) by mouth daily.   60 tablet   11   . loratadine (CLARITIN) 10 MG tablet   Oral   Take 1 tablet (10 mg total) by mouth daily.   30 tablet   11   . metoprolol succinate (TOPROL-XL) 50 MG 24 hr tablet   Oral   Take 1 tablet (50 mg total) by mouth daily. Take with or immediately following a meal.   90 tablet   2    BP 187/92  Pulse 98  Temp(Src) 97.9 F (36.6 C) (Oral)  Resp 14  Ht 5\' 6"  (1.676 m)  Wt 146 lb (66.225 kg)  BMI 23.58 kg/m2  SpO2 92% Physical Exam  Nursing note and vitals reviewed. Constitutional: She is oriented to person, place, and time. She appears well-developed and well-nourished.  HENT:  Head: Normocephalic and atraumatic.  Neck: Neck supple.  Cardiovascular: Normal rate and  regular rhythm.   Pulses:      Radial pulses are 2+ on the right side, and 2+ on the left side.  Trace bilateral pitting edema  Pulmonary/Chest: Effort normal and breath sounds normal. No respiratory distress. She has no decreased breath sounds. She has no wheezes.  Patient is able to speak in complete sentences. No obvious wheezing, crackles at bases.   Abdominal: Soft.  Neurological: She is alert and oriented to person, place, and time.  Skin: Skin is warm and dry.  Psychiatric: She has a normal mood and affect. Her behavior is normal.    ED Course  Procedures (including critical care time) Labs Review Labs Reviewed  CBC - Abnormal; Notable for the following:    RBC 3.62 (*)    Hemoglobin 11.1 (*)    HCT 32.6 (*)    All other components within normal limits  BASIC METABOLIC PANEL - Abnormal; Notable for the following:    Glucose, Bld 184 (*)    GFR calc non Af Amer 57 (*)    GFR calc Af Amer 66 (*)    All other components within normal limits  Rosezena SensorI-STAT TROPOININ, ED   Imaging Review Dg Chest 2 View  11/07/2013   CLINICAL DATA:  Bronchitis.  Cough.  Wheezing.  Short of breath.  EXAM: CHEST  2 VIEW  COMPARISON:  DG CHEST 2 VIEW dated 02/05/2010  FINDINGS: Postoperative changes of CABG and mitral annuloplasty. The cardiopericardial silhouette is enlarged. Interstitial pulmonary edema is present with the diffuse interstitial pattern, more prominent at the bases. Kerley B-lines are present in both bases and thickening of the fissures is present on the lateral view. There is no pleural effusion.  IMPRESSION: Mild CHF with interstitial pulmonary edema.   Electronically Signed   By: Andreas NewportGeoffrey  Lamke M.D.   On: 11/07/2013 07:21     EKG Interpretation None      MDM   Final diagnoses:  Dyspnea  Elevated blood sugar   Pt with a history of COPD and non-compliant with lasix presents with dyspnea for 1 day, Afebrile with mild crackles at base, questionable COPD exacerbation or mild CHF.   Albuterol and home lasix given. XR shows mild  CHF. Discussed with Dr. Wilkie Aye, agrees on current evaluation and follow up as an out-patient. 0750 Re-eval pt reports wheezing has improved.  96% RA.  Requesting to be discharged home. Discussed lab results, imaging results, and treatment plan with the patient. Return precautions given. Reports understanding and no other concerns at this time.  Patient is stable for discharge at this time. Meds given in ED:  Medications  albuterol (PROVENTIL) (2.5 MG/3ML) 0.083% nebulizer solution 5 mg (5 mg Nebulization Given 11/07/13 0646)  furosemide (LASIX) injection 40 mg (40 mg Intravenous Given 11/07/13 0739)    Discharge Medication List as of 11/07/2013  8:35 AM       Meds given in ED:  Medications  albuterol (PROVENTIL) (2.5 MG/3ML) 0.083% nebulizer solution 5 mg (5 mg Nebulization Given 11/07/13 0646)  furosemide (LASIX) injection 40 mg (40 mg Intravenous Given 11/07/13 0739)    Discharge Medication List as of 11/07/2013  8:35 AM          Leotis Shames Doretha Imus, PA-C 11/08/13 1055

## 2013-11-07 NOTE — ED Notes (Signed)
Patient transported to X-ray 

## 2013-11-07 NOTE — ED Notes (Signed)
Pt ambulated with sats 93-95% on RA. States she feels much better.

## 2013-11-07 NOTE — ED Notes (Signed)
Pt voided in the toilet. Instructed pt we need to measure urine output from this point forward. Pt agreeable.

## 2013-11-07 NOTE — ED Notes (Signed)
Pt. reports wheezing with dry cough , chest congestion  and SOB onset this morning unrelieved by prescription inhalers .

## 2013-11-08 NOTE — ED Provider Notes (Signed)
Medical screening examination/treatment/procedure(s) were performed by non-physician practitioner and as supervising physician I was immediately available for consultation/collaboration.   EKG Interpretation None       Shon Baton, MD 11/08/13 1346

## 2013-11-14 ENCOUNTER — Ambulatory Visit: Payer: Self-pay | Admitting: Internal Medicine

## 2014-02-08 ENCOUNTER — Telehealth: Payer: Self-pay

## 2014-02-08 NOTE — Telephone Encounter (Signed)
Returned patient call Patient wanting to pick up her prescription tomorrow i informed the patient pharmacy is closed 02/09/14 Due to the holiday-she would have to pick it up today or monday

## 2014-03-21 ENCOUNTER — Other Ambulatory Visit: Payer: Self-pay | Admitting: Internal Medicine

## 2014-03-21 DIAGNOSIS — I10 Essential (primary) hypertension: Secondary | ICD-10-CM

## 2014-05-11 ENCOUNTER — Other Ambulatory Visit: Payer: Self-pay

## 2014-05-11 MED ORDER — METOPROLOL SUCCINATE ER 50 MG PO TB24: 50.0000 mg | ORAL_TABLET | Freq: Every day | ORAL | Status: DC

## 2014-05-14 ENCOUNTER — Other Ambulatory Visit: Payer: Self-pay

## 2014-05-30 ENCOUNTER — Other Ambulatory Visit: Payer: Self-pay

## 2014-07-13 ENCOUNTER — Other Ambulatory Visit: Payer: Self-pay | Admitting: Internal Medicine

## 2014-07-13 DIAGNOSIS — I739 Peripheral vascular disease, unspecified: Secondary | ICD-10-CM | POA: Diagnosis not present

## 2014-07-13 DIAGNOSIS — E785 Hyperlipidemia, unspecified: Secondary | ICD-10-CM | POA: Diagnosis not present

## 2014-07-13 DIAGNOSIS — I2510 Atherosclerotic heart disease of native coronary artery without angina pectoris: Secondary | ICD-10-CM | POA: Diagnosis not present

## 2014-07-13 DIAGNOSIS — I10 Essential (primary) hypertension: Secondary | ICD-10-CM | POA: Diagnosis not present

## 2014-07-13 DIAGNOSIS — Z1231 Encounter for screening mammogram for malignant neoplasm of breast: Secondary | ICD-10-CM

## 2014-07-13 DIAGNOSIS — E119 Type 2 diabetes mellitus without complications: Secondary | ICD-10-CM | POA: Diagnosis not present

## 2014-07-13 DIAGNOSIS — Z6825 Body mass index (BMI) 25.0-25.9, adult: Secondary | ICD-10-CM | POA: Diagnosis not present

## 2014-07-27 DIAGNOSIS — Z1212 Encounter for screening for malignant neoplasm of rectum: Secondary | ICD-10-CM | POA: Diagnosis not present

## 2014-08-07 ENCOUNTER — Other Ambulatory Visit: Payer: Self-pay | Admitting: Internal Medicine

## 2014-08-07 ENCOUNTER — Ambulatory Visit
Admission: RE | Admit: 2014-08-07 | Discharge: 2014-08-07 | Disposition: A | Discharge status: Home / Self Care | Payer: Medicare Other | Source: Ambulatory Visit | Attending: Internal Medicine | Admitting: Internal Medicine

## 2014-08-07 DIAGNOSIS — Z1231 Encounter for screening mammogram for malignant neoplasm of breast: Secondary | ICD-10-CM

## 2014-08-07 DIAGNOSIS — Z780 Asymptomatic menopausal state: Secondary | ICD-10-CM | POA: Diagnosis not present

## 2014-08-07 DIAGNOSIS — E119 Type 2 diabetes mellitus without complications: Secondary | ICD-10-CM | POA: Diagnosis not present

## 2014-10-11 DIAGNOSIS — F17200 Nicotine dependence, unspecified, uncomplicated: Secondary | ICD-10-CM | POA: Diagnosis not present

## 2014-10-11 DIAGNOSIS — R809 Proteinuria, unspecified: Secondary | ICD-10-CM | POA: Diagnosis not present

## 2014-10-11 DIAGNOSIS — E1129 Type 2 diabetes mellitus with other diabetic kidney complication: Secondary | ICD-10-CM | POA: Diagnosis not present

## 2014-10-11 DIAGNOSIS — I739 Peripheral vascular disease, unspecified: Secondary | ICD-10-CM | POA: Diagnosis not present

## 2014-10-11 DIAGNOSIS — I10 Essential (primary) hypertension: Secondary | ICD-10-CM | POA: Diagnosis not present

## 2014-10-11 DIAGNOSIS — E785 Hyperlipidemia, unspecified: Secondary | ICD-10-CM | POA: Diagnosis not present

## 2014-10-11 DIAGNOSIS — Z6822 Body mass index (BMI) 22.0-22.9, adult: Secondary | ICD-10-CM | POA: Diagnosis not present

## 2014-10-11 DIAGNOSIS — I2510 Atherosclerotic heart disease of native coronary artery without angina pectoris: Secondary | ICD-10-CM | POA: Diagnosis not present

## 2015-07-23 ENCOUNTER — Other Ambulatory Visit: Payer: Self-pay

## 2015-07-23 DIAGNOSIS — Z1231 Encounter for screening mammogram for malignant neoplasm of breast: Secondary | ICD-10-CM

## 2015-08-13 ENCOUNTER — Ambulatory Visit
Admission: RE | Admit: 2015-08-13 | Discharge: 2015-08-13 | Disposition: A | Discharge status: Home / Self Care | Payer: Medicare Other | Source: Ambulatory Visit

## 2015-08-13 DIAGNOSIS — Z1231 Encounter for screening mammogram for malignant neoplasm of breast: Secondary | ICD-10-CM | POA: Diagnosis not present

## 2015-09-23 ENCOUNTER — Ambulatory Visit (INDEPENDENT_AMBULATORY_CARE_PROVIDER_SITE_OTHER): Payer: Medicare Other | Admitting: Family Medicine

## 2015-09-23 ENCOUNTER — Encounter: Payer: Self-pay | Admitting: Family Medicine

## 2015-09-23 VITALS — BP 126/64 | HR 60 | Wt 143.4 lb

## 2015-09-23 DIAGNOSIS — E785 Hyperlipidemia, unspecified: Secondary | ICD-10-CM

## 2015-09-23 DIAGNOSIS — E119 Type 2 diabetes mellitus without complications: Secondary | ICD-10-CM

## 2015-09-23 DIAGNOSIS — I10 Essential (primary) hypertension: Secondary | ICD-10-CM | POA: Diagnosis not present

## 2015-09-23 DIAGNOSIS — R0609 Other forms of dyspnea: Secondary | ICD-10-CM

## 2015-09-23 DIAGNOSIS — Z7689 Persons encountering health services in other specified circumstances: Secondary | ICD-10-CM

## 2015-09-23 DIAGNOSIS — J4520 Mild intermittent asthma, uncomplicated: Secondary | ICD-10-CM | POA: Diagnosis not present

## 2015-09-23 DIAGNOSIS — Z7189 Other specified counseling: Secondary | ICD-10-CM | POA: Diagnosis not present

## 2015-09-23 DIAGNOSIS — I499 Cardiac arrhythmia, unspecified: Secondary | ICD-10-CM

## 2015-09-23 LAB — CBC WITH DIFFERENTIAL/PLATELET
Basophils Absolute: 0 10*3/uL (ref 0.0–0.1)
Basophils Relative: 0 % (ref 0–1)
Eosinophils Absolute: 0.2 10*3/uL (ref 0.0–0.7)
Eosinophils Relative: 2 % (ref 0–5)
HEMATOCRIT: 36.3 % (ref 36.0–46.0)
HEMOGLOBIN: 11.9 g/dL — AB (ref 12.0–15.0)
LYMPHS ABS: 2.2 10*3/uL (ref 0.7–4.0)
Lymphocytes Relative: 28 % (ref 12–46)
MCH: 29.9 pg (ref 26.0–34.0)
MCHC: 32.8 g/dL (ref 30.0–36.0)
MCV: 91.2 fL (ref 78.0–100.0)
MONO ABS: 1 10*3/uL (ref 0.1–1.0)
MPV: 10.1 fL (ref 8.6–12.4)
Monocytes Relative: 13 % — ABNORMAL HIGH (ref 3–12)
NEUTROS ABS: 4.6 10*3/uL (ref 1.7–7.7)
NEUTROS PCT: 57 % (ref 43–77)
Platelets: 223 10*3/uL (ref 150–400)
RBC: 3.98 MIL/uL (ref 3.87–5.11)
RDW: 15.1 % (ref 11.5–15.5)
WBC: 8 10*3/uL (ref 4.0–10.5)

## 2015-09-23 LAB — COMPREHENSIVE METABOLIC PANEL
ALBUMIN: 4.2 g/dL (ref 3.6–5.1)
ALT: 14 U/L (ref 6–29)
AST: 15 U/L (ref 10–35)
Alkaline Phosphatase: 67 U/L (ref 33–130)
BUN: 16 mg/dL (ref 7–25)
CALCIUM: 9.7 mg/dL (ref 8.6–10.4)
CHLORIDE: 104 mmol/L (ref 98–110)
CO2: 28 mmol/L (ref 20–31)
Creat: 1.01 mg/dL — ABNORMAL HIGH (ref 0.50–0.99)
Glucose, Bld: 97 mg/dL (ref 65–99)
POTASSIUM: 3.7 mmol/L (ref 3.5–5.3)
Sodium: 141 mmol/L (ref 135–146)
TOTAL PROTEIN: 7.4 g/dL (ref 6.1–8.1)
Total Bilirubin: 0.5 mg/dL (ref 0.2–1.2)

## 2015-09-23 LAB — POCT GLYCOSYLATED HEMOGLOBIN (HGB A1C): HEMOGLOBIN A1C: 7

## 2015-09-23 MED ORDER — METOPROLOL TARTRATE 25 MG PO TABS: ORAL_TABLET | ORAL | Status: DC

## 2015-09-23 NOTE — Progress Notes (Signed)
Subjective:    Patient ID: Rachel Oneill, female    DOB: [birth year], 65 y.o.   MRN: [id number]  HPI Chief Complaint  Patient presents with  . new pt    new pt, get est. no other concerns   She is new to the practice and here to establish primary care. She was going to Dr. Royann Shivers, states she has an outstanding bill and they wouldn't see her. She was last there in September 2016. No records today.  She states she did the [place] in September and does not know the results.  She had a mammogram January 2017 and it was normal - told to follow-up in one year.  Other providers: states none but has seen Cardiologist in past Dr. Nadara Eaton. States she was told she did not have to return.  Last eye exam: 1 1/2 years ago- states she was told everything was ok.   States she had bypass surgery in 2007. Dr. Nadara Eaton - has not seen him in years.  She was diagnosed with Diabetes in 2007. States she takes Metformin 500 mg once daily. She checks her blood sugars at home and readings are 112, 120 fasting.  Diet: eats some sweets but not often. She watches carbohydrates. Exercises at the gym 3 times weekly.   She takes Lasix 3 times per week for past week 1/12 years. States she was taking it daily and it "drained" her so she just takes it when she short of breath and "gurgling sensation" in her throat. States she decided to change the way she was taking her Lasix and Metoprolol but does not have an explanation as to why she changed the way she takes the metoprolol. She states she only takes metoprolol once daily and not twice daily as it was prescribed. Asthma- uses albuterol 2 times per month.  She reports having DOE and this has been going on for several weeks. Denies chest pain, palpitations, LE edema, orthopnea.   Lives alone. Daughter lives near.  Works at [employer] in [employer].  Denies smoking, alcohol, and drug use.  Review of Systems Pertinent positives and negatives in the history of  present illness.     Objective:   Physical Exam  Constitutional: She is oriented to person, place, and time. She appears well-developed and well-nourished. No distress.  Cardiovascular: Normal rate and intact distal pulses.  An irregular rhythm present.  Pulmonary/Chest: Effort normal and breath sounds normal.  Neurological: She is alert and oriented to person, place, and time.  Skin: Skin is warm and dry. No cyanosis. No pallor. Nails show no clubbing.  Psychiatric: She has a normal mood and affect. Her speech is normal and behavior is normal. Judgment and thought content normal. Cognition and memory are normal.   BP 126/64 mmHg  Pulse 60  Wt 143 lb 6.4 oz (65.046 kg)   ECG indication: DOE, irregular pulse Rate- 70 PR interval QRS 106 ms SR with 1st degree block with occasional PVSs and PACs. 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 DM2.   A1C 7.0     Assessment & Plan:  Essential hypertension - Plan: metoprolol tartrate (LOPRESSOR) 25 MG tablet, EKG 12-Lead, CBC with Differential/Platelet, Comprehensive metabolic panel, DISCONTINUED: metoprolol tartrate (LOPRESSOR) 25 MG tablet  Type 2 diabetes mellitus without complication, without long-term current use of insulin (HCC) - Plan: HgB A1c, Comprehensive metabolic panel  Hyperlipidemia LDL goal <100  Encounter to establish care  Asthma, mild  intermittent, uncomplicated  DOE (dyspnea on exertion) - Plan: EKG 12-Lead  Irregular heart rate - Plan: EKG 12-Lead, TSH  Discussed that we will need to get medical records from [facility]. Would also like to see the results of the Cologuard test.  Asthma appears well controlled. No changes made.  She appears to be doing fine on current statin dose. No changes made to this.  Recommend that she continue checking her blood sugar daily fasting and occasionally check it 2 hours after a meal. Discussed that she should bring in readings and  her meter to her next appointment in 2 weeks. Would like for her A1C to be <7 and today is 7.0%. Recommend she watch her carbohydrate and sugar intake. She is on a low dose of Metformin. She will follow up in 2 weeks and we may increase the dose depending on her home readings.   Recommend that she continue checking her blood pressure at home and let me know if she is having any readings >130/90. Will refill Metoprolol and she should start taking it twice daily as originally prescribed. Discontinue Lasix for now since she is only taking this sporadically. Will appreciate cardiologist input on her medications. Discussed importance of medication compliance and following up as recommended with providers. Martie Lee, CMA called to schedule her cardiologist appointment and was told that patient had a couple of no shows and this contradicted patient's story of being told she did not have to keep going to her cardiologist.   Dr. Susann Givens read ECG and agreed that she should follow up with cardiologist.  Referral made and Sabrina, CMA scheduled patient for 2 days from now. Patient is aware. Advised her on importance of showing up to her appointments and following through with treatment plans.

## 2015-09-23 NOTE — Patient Instructions (Addendum)
Your A1C today is 7.0. I would like to see it less than 7.0. Check your blood sugar daily fasting and then once or twice per week check your blood sugar 2 hours after a meal. Right down these numbers and bring them back to me at our next appointment. Bring in your blood sugar meter to your visit next time. I refilled your metoprolol. Stop the lasix for now.  Follow up in 2 weeks.  Follow up with your cardiologist in next week or two.   Basic Carbohydrate Counting for Diabetes Mellitus Carbohydrate counting is a method for keeping track of the amount of carbohydrates you eat. Eating carbohydrates naturally increases the level of sugar (glucose) in your blood, so it is important for you to know the amount that is okay for you to have in every meal. Carbohydrate counting helps keep the level of glucose in your blood within normal limits. The amount of carbohydrates allowed is different for every person. A dietitian can help you calculate the amount that is right for you. Once you know the amount of carbohydrates you can have, you can count the carbohydrates in the foods you want to eat. Carbohydrates are found in the following foods:  Grains, such as breads and cereals.  Dried beans and soy products.  Starchy vegetables, such as potatoes, peas, and corn.  Fruit and fruit juices.  Milk and yogurt.  Sweets and snack foods, such as cake, cookies, candy, chips, soft drinks, and fruit drinks. CARBOHYDRATE COUNTING There are two ways to count the carbohydrates in your food. You can use either of the methods or a combination of both. Reading the "Nutrition Facts" on Packaged Food The "Nutrition Facts" is an area that is included on the labels of almost all packaged food and beverages in the [place]. It includes the serving size of that food or beverage and information about the nutrients in each serving of the food, including the grams (g) of carbohydrate per serving.  Decide the number of  servings of this food or beverage that you will be able to eat or drink. Multiply that number of servings by the number of grams of carbohydrate that is listed on the label for that serving. The total will be the amount of carbohydrates you will be having when you eat or drink this food or beverage. Learning Standard Serving Sizes of Food When you eat food that is not packaged or does not include "Nutrition Facts" on the label, you need to measure the servings in order to count the amount of carbohydrates.A serving of most carbohydrate-rich foods contains about 15 g of carbohydrates. The following list includes serving sizes of carbohydrate-rich foods that provide 15 g ofcarbohydrate per serving:   1 slice of bread (1 oz) or 1 six-inch tortilla.    of a hamburger bun or English muffin.  4-6 crackers.   cup unsweetened dry cereal.    cup hot cereal.   cup rice or pasta.    cup mashed potatoes or  of a large baked potato.  1 cup fresh fruit or one small piece of fruit.    cup canned or frozen fruit or fruit juice.  1 cup milk.   cup plain fat-free yogurt or yogurt sweetened with artificial sweeteners.   cup cooked dried beans or starchy vegetable, such as peas, corn, or potatoes.  Decide the number of standard-size servings that you will eat. Multiply that number of servings by 15 (the grams of carbohydrates in that serving).  For example, if you eat 2 cups of strawberries, you will have eaten 2 servings and 30 g of carbohydrates (2 servings x 15 g = 30 g). For foods such as soups and casseroles, in which more than one food is mixed in, you will need to count the carbohydrates in each food that is included. EXAMPLE OF CARBOHYDRATE COUNTING Sample Dinner  3 oz chicken breast.   cup of brown rice.   cup of corn.  1 cup milk.   1 cup strawberries with sugar-free whipped topping.  Carbohydrate Calculation Step 1: Identify the foods that contain carbohydrates:    Rice.   Corn.   Milk.   Strawberries. Step 2:Calculate the number of servings eaten of each:   2 servings of rice.   1 serving of corn.   1 serving of milk.   1 serving of strawberries. Step 3: Multiply each of those number of servings by 15 g:   2 servings of rice x 15 g = 30 g.   1 serving of corn x 15 g = 15 g.   1 serving of milk x 15 g = 15 g.   1 serving of strawberries x 15 g = 15 g. Step 4: Add together all of the amounts to find the total grams of carbohydrates eaten: 30 g + 15 g + 15 g + 15 g = 75 g.   This information is not intended to replace advice given to you by your health care provider. Make sure you discuss any questions you have with your health care provider.   Document Released: 07/27/2005 Document Revised: 08/17/2014 Document Reviewed: 06/23/2013 Elsevier Interactive Patient Education [employer]

## 2015-09-24 LAB — TSH: TSH: 1.52 m[IU]/L

## 2015-10-07 ENCOUNTER — Ambulatory Visit (INDEPENDENT_AMBULATORY_CARE_PROVIDER_SITE_OTHER): Payer: Medicare Other | Admitting: Family Medicine

## 2015-10-07 ENCOUNTER — Encounter: Payer: Self-pay | Admitting: Family Medicine

## 2015-10-07 VITALS — BP 124/64 | HR 64 | Wt 139.6 lb

## 2015-10-07 DIAGNOSIS — E119 Type 2 diabetes mellitus without complications: Secondary | ICD-10-CM | POA: Diagnosis not present

## 2015-10-07 DIAGNOSIS — E785 Hyperlipidemia, unspecified: Secondary | ICD-10-CM

## 2015-10-07 DIAGNOSIS — I5022 Chronic systolic (congestive) heart failure: Secondary | ICD-10-CM

## 2015-10-07 DIAGNOSIS — I10 Essential (primary) hypertension: Secondary | ICD-10-CM

## 2015-10-07 NOTE — Patient Instructions (Signed)
Continue checking your blood sugar and blood pressure as discussed. Keep a record. Also start weighing yourself 2-3 times per week and keep a log of this. Return for fasting cholesterol blood work at [facility].  Let me know if your blood sugars or blood pressures are not within range.  Continue eating a low-sodium diet DASH Eating Plan DASH stands for "Dietary Approaches to Stop Hypertension." The DASH eating plan is a healthy eating plan that has been shown to reduce high blood pressure (hypertension). Additional health benefits may include reducing the risk of type 2 diabetes mellitus, heart disease, and stroke. The DASH eating plan may also help with weight loss. WHAT DO I NEED TO KNOW ABOUT THE DASH EATING PLAN? For the DASH eating plan, you will follow these general guidelines:  Choose foods with a percent daily value for sodium of less than 5% (as listed on the food label).  Use salt-free seasonings or herbs instead of table salt or sea salt.  Check with your health care provider or pharmacist before using salt substitutes.  Eat lower-sodium products, often labeled as "lower sodium" or "no salt added."  Eat fresh foods.  Eat more vegetables, fruits, and low-fat dairy products.  Choose whole grains. Look for the word "whole" as the first word in the ingredient list.  Choose fish and skinless chicken or [place] more often than red meat. Limit fish, poultry, and meat to 6 oz (170 g) each day.  Limit sweets, desserts, sugars, and sugary drinks.  Choose heart-healthy fats.  Limit cheese to 1 oz (28 g) per day.  Eat more home-cooked food and less restaurant, buffet, and fast food.  Limit fried foods.  Cook foods using methods other than frying.  Limit canned vegetables. If you do use them, rinse them well to decrease the sodium.  When eating at a restaurant, ask that your food be prepared with less salt, or no salt if possible. WHAT FOODS CAN I EAT? Seek help from a  dietitian for individual calorie needs. Grains Whole grain or whole wheat bread. Brown rice. Whole grain or whole wheat pasta. Quinoa, bulgur, and whole grain cereals. Low-sodium cereals. Corn or whole wheat flour tortillas. Whole grain cornbread. Whole grain crackers. Low-sodium crackers. Vegetables Fresh or frozen vegetables (raw, steamed, roasted, or grilled). Low-sodium or reduced-sodium tomato and vegetable juices. Low-sodium or reduced-sodium tomato sauce and paste. Low-sodium or reduced-sodium canned vegetables.  Fruits All fresh, canned (in natural juice), or frozen fruits. Meat and Other Protein Products Ground beef (85% or leaner), grass-fed beef, or beef trimmed of fat. Skinless chicken or [place]. Ground chicken or [place]. Pork trimmed of fat. All fish and seafood. Eggs. Dried beans, peas, or lentils. Unsalted nuts and seeds. Unsalted canned beans. Dairy Low-fat dairy products, such as skim or 1% milk, 2% or reduced-fat cheeses, low-fat ricotta or cottage cheese, or plain low-fat yogurt. Low-sodium or reduced-sodium cheeses. Fats and Oils Tub margarines without trans fats. Light or reduced-fat mayonnaise and salad dressings (reduced sodium). Avocado. Safflower, olive, or canola oils. Natural peanut or almond butter. Other Unsalted popcorn and pretzels. The items listed above may not be a complete list of recommended foods or beverages. Contact your dietitian for more options. WHAT FOODS ARE NOT RECOMMENDED? Grains White bread. White pasta. White rice. Refined cornbread. Bagels and croissants. Crackers that contain trans fat. Vegetables Creamed or fried vegetables. Vegetables in a cheese sauce. Regular canned vegetables. Regular canned tomato sauce and paste. Regular tomato and vegetable juices. Fruits Dried fruits.  Canned fruit in light or heavy syrup. Fruit juice. Meat and Other Protein Products Fatty cuts of meat. Ribs, chicken wings, bacon, sausage, bologna, salami,  chitterlings, fatback, hot dogs, bratwurst, and packaged luncheon meats. Salted nuts and seeds. Canned beans with salt. Dairy Whole or 2% milk, cream, half-and-half, and cream cheese. Whole-fat or sweetened yogurt. Full-fat cheeses or blue cheese. Nondairy creamers and whipped toppings. Processed cheese, cheese spreads, or cheese curds. Condiments Onion and garlic salt, seasoned salt, table salt, and sea salt. Canned and packaged gravies. Worcestershire sauce. Tartar sauce. Barbecue sauce. Teriyaki sauce. Soy sauce, including reduced sodium. Steak sauce. Fish sauce. Oyster sauce. Cocktail sauce. Horseradish. Ketchup and mustard. Meat flavorings and tenderizers. Bouillon cubes. Hot sauce. Tabasco sauce. Marinades. Taco seasonings. Relishes. Fats and Oils Butter, stick margarine, lard, shortening, ghee, and bacon fat. Coconut, palm kernel, or palm oils. Regular salad dressings. Other Pickles and olives. Salted popcorn and pretzels. The items listed above may not be a complete list of foods and beverages to avoid. Contact your dietitian for more information. WHERE CAN I FIND MORE INFORMATION? National Heart, Lung, and Blood Institute: travelstabloid.com   This information is not intended to replace advice given to you by your health care provider. Make sure you discuss any questions you have with your health care provider.   Document Released: 07/16/2011 Document Revised: 08/17/2014 Document Reviewed: 05/31/2013 Elsevier Interactive Patient Education [employer].

## 2015-10-07 NOTE — Progress Notes (Signed)
Subjective:    Patient ID: Rachel Oneill, female    DOB: Feb 05, 1951, 65 y.o.   MRN: [id number]  HPI Chief Complaint  Patient presents with  . 2 week follow-up    follow-up on bp. cardiology appt 10/23/15 @ 3:00pm with Dr. Jacinto Halim   She is a 65 year old female with a history of CHF, HTN, DM2, hyperlipidemia, and irregular heart rhythm who is here for follow up on HTN and diabetes. She reports good medication compliance and has been checking her blood sugars and blood pressures at home. She denies any complaints or concerns today. She has not yet followed up with her cardiologist and states there was a mixup with her appointment. She has an appointment 10/23/2015 and plans to go to this appointment. She has not seen her cardiologist in several years. At her last visit, there were some discrepancies as to how she was taking her medications. We stopped Lasix as she was only taking this sporadically and I would like for her to see her cardiologist to determine whether she needs to be on this medication. There is some concern as to her blood pressures being low at times.  She reports that she has been trying to watch her sodium intake and has cut back on sodas. Her blood pressures at home have been: 121/61, 184/52, 112/72, 121/61, 129/72, 103/78. 117/61 in evening, 91/59, 120/61.  States she weighs weekly and has not noticed a significant increase.  She is taking Metformin 500 mg daily. Her blood sugars at home have been: fasting- 123- 147 and evening 2 hours pp 126, 133, 140, 157  Denies fever, chills, syncope, dizziness, chest pain, palpitations, DOE, cough, LE edema.   Has been taking statin, no documentation of lipids since 2014.    She lives alone, works daily Monday -Friday. Does not drink or smoke   Review of Systems Pertinent positives and negatives in the history of present illness.     Objective:   Physical Exam  Constitutional: She is oriented to person, place, and time. She  appears well-developed and well-nourished. No distress.  Cardiovascular: Normal rate, regular rhythm and normal pulses.   Murmur heard.  Systolic murmur is present with a grade of 3/6  Pulmonary/Chest: Effort normal and breath sounds normal.  Neurological: She is alert and oriented to person, place, and time.  Skin: Skin is warm and dry.  Psychiatric: She has a normal mood and affect. Her behavior is normal. Judgment and thought content normal.   BP 124/64 mmHg  Pulse 64  Wt 139 lb 9.6 oz (63.322 kg)      Assessment & Plan:  Essential hypertension  Type 2 diabetes mellitus without complication, without long-term current use of insulin (HCC)  Chronic systolic heart failure (HCC)  Hyperlipidemia LDL goal <100  Discussed that she is doing a good job keeping up with blood sugars and blood pressures and her readings appear to be in normal range for the most part. Recommend she continue checking blood sugars twice daily and blood pressure daily and keep a record. I would also like for her to start weighing herself at least every other day and keep a record of this as well. She is aware that if she has a weight gain of >2 lbs on any given day that I would like for her to call me. She is also aware to keep an eye on development of LE edema, cough or any signs of worsening CHF.  No changes made to  medications today.  She has appointment on 03/15 with cardiologist and I look forward to their input regarding further evaluation and medication regimen since has not seen them in several years. Discussed that she appears to be in a regular rhythm today and no new symptoms since her last visit. She seems to be doing well and appears happy.  She will return for fasting lipid panel. Order in chart.  Recommend that she return for follow-up in one month.

## 2015-10-23 DIAGNOSIS — Z951 Presence of aortocoronary bypass graft: Secondary | ICD-10-CM | POA: Diagnosis not present

## 2015-10-23 DIAGNOSIS — I739 Peripheral vascular disease, unspecified: Secondary | ICD-10-CM | POA: Diagnosis not present

## 2015-10-23 DIAGNOSIS — I10 Essential (primary) hypertension: Secondary | ICD-10-CM | POA: Diagnosis not present

## 2015-10-23 DIAGNOSIS — I2510 Atherosclerotic heart disease of native coronary artery without angina pectoris: Secondary | ICD-10-CM | POA: Diagnosis not present

## 2015-11-04 ENCOUNTER — Encounter: Payer: Self-pay | Admitting: Family Medicine

## 2015-11-04 ENCOUNTER — Ambulatory Visit (INDEPENDENT_AMBULATORY_CARE_PROVIDER_SITE_OTHER): Payer: Medicare Other | Admitting: Family Medicine

## 2015-11-04 VITALS — BP 122/64 | HR 64 | Wt 140.0 lb

## 2015-11-04 DIAGNOSIS — E119 Type 2 diabetes mellitus without complications: Secondary | ICD-10-CM

## 2015-11-04 DIAGNOSIS — I10 Essential (primary) hypertension: Secondary | ICD-10-CM | POA: Diagnosis not present

## 2015-11-04 DIAGNOSIS — I739 Peripheral vascular disease, unspecified: Secondary | ICD-10-CM | POA: Insufficient documentation

## 2015-11-04 DIAGNOSIS — I714 Abdominal aortic aneurysm, without rupture, unspecified: Secondary | ICD-10-CM | POA: Insufficient documentation

## 2015-11-04 NOTE — Progress Notes (Signed)
Subjective:    Patient ID: Rachel Oneill, female    DOB: 1951/01/20, 65 y.o.   MRN: [id number]  [name] is a 65 y.o. female who presents for follow-up of Type 2 diabetes mellitus.  Patient is checking home blood sugars.  Checks it every other day.  Home blood sugar records: BGs range between 98 and 150 How often is blood sugars being checked: every other day Current symptoms include: none. Patient denies foot ulcerations, nausea, polyuria, visual disturbances, vomiting and weight loss.  Patient is checking their feet daily. Any Foot concerns (callous, ulcer, wound, thickened nails, toenail fungus, skin fungus, hammer toe): none  Last dilated eye exam- summer 2016.  Current treatments: diabetes meds. Metformin 500 mg  Medication compliance: excellent  Current diet: in general, a "healthy" diet   Current exercise: walking Known diabetic complications: none   Last eye exam was last summer, 2016.   She states that she has to walk long distances sometimes at work and she gets short of breath with activity. States she has been using an albuterol inhaler when this occurs but states it does not really improve her symptoms. Her symptoms improved with rest. She denies fever, chills, chest pain, palpitations, cough, lower extremity edema.  She states Dr. Jacinto Halim recently changed her blood pressure medication and that she has noticed feeling better since switching.   The following portions of the patient's history were reviewed and updated as appropriate: allergies, current medications, past medical history, past social history and problem list.  ROS as in subjective above.     Objective:    Physical Exam Alert and in no distress otherwise not examined.  Blood pressure 122/64, pulse 64, weight 140 lb (63.504 kg).  Lab Review Diabetic Labs Latest Ref Rng 09/23/2015 11/07/2013 10/11/2013 07/05/2013 09/01/2012  HbA1c - 7.0 - 6.9 7.0(H) 7.7(H)  Microalbumin 0.00-1.89 mg/dL - - - - -    Chol 0 - 132 mg/dL - - - 440 102  HDL >72 mg/dL - - - 53(G) 64(Q)  Calc LDL 0 - 99 mg/dL - - - 58 034(V)  Triglycerides <150 mg/dL - - - 95 425(Z)  Creatinine 0.50 - 0.99 mg/dL 5.63(O) 7.56 - 4.33 2.95   BP/Weight 11/04/2015 10/07/2015 09/23/2015 11/07/2013 10/11/2013  Systolic BP 122 124 126 126 125  Diastolic BP 64 64 64 103 67  Wt. (Lbs) 140 139.6 143.4 146 141  BMI 22.61 22.54 23.16 23.58 22.77   No flowsheet data found.  [name]  reports that she has never smoked. She does not have any smokeless tobacco history on file. She reports that she does not drink alcohol or use illicit drugs.     Assessment & Plan:    Type 2 diabetes mellitus without complication, without long-term current use of insulin (HCC)  1. Rx changes: none 2. Education: Reviewed 'ABCs' of diabetes management (respective goals in parentheses):  A1C (<7), blood pressure (<130/80), and cholesterol (LDL <100). 3. Compliance at present is estimated to be excellent. Efforts to improve compliance (if necessary) will be directed at no changes. . Cholesterol will be checked at her next appointment with Dr. Jacinto Halim.  4. Discussed that she has been using her albuterol inhaler 2-3 times per week for shortness of breath with exertion, however she states it does not help with her symptoms. Recommend that she stop using it. She does not have a history of asthma however was prescribed this medication for bronchitis in the past. Discussed that her shortness of breath may  be more cardiac related and she is being evaluated by cardiology for this. This is an ongoing issue and has not worsened per patient. 5. Follow up: 3 months  6. Follow up with Dr. Jacinto Halim. As scheduled in April.

## 2015-11-12 DIAGNOSIS — E7800 Pure hypercholesterolemia, unspecified: Secondary | ICD-10-CM | POA: Diagnosis not present

## 2015-11-12 DIAGNOSIS — I10 Essential (primary) hypertension: Secondary | ICD-10-CM | POA: Diagnosis not present

## 2015-11-18 DIAGNOSIS — I10 Essential (primary) hypertension: Secondary | ICD-10-CM | POA: Diagnosis not present

## 2015-11-18 DIAGNOSIS — I739 Peripheral vascular disease, unspecified: Secondary | ICD-10-CM | POA: Diagnosis not present

## 2015-11-18 DIAGNOSIS — R0602 Shortness of breath: Secondary | ICD-10-CM | POA: Diagnosis not present

## 2015-11-19 DIAGNOSIS — I739 Peripheral vascular disease, unspecified: Secondary | ICD-10-CM | POA: Diagnosis not present

## 2015-11-19 DIAGNOSIS — R0609 Other forms of dyspnea: Secondary | ICD-10-CM | POA: Diagnosis not present

## 2015-11-19 DIAGNOSIS — I714 Abdominal aortic aneurysm, without rupture: Secondary | ICD-10-CM | POA: Diagnosis not present

## 2015-11-26 DIAGNOSIS — S92912A Unspecified fracture of left toe(s), initial encounter for closed fracture: Secondary | ICD-10-CM | POA: Diagnosis not present

## 2015-12-03 DIAGNOSIS — I420 Dilated cardiomyopathy: Secondary | ICD-10-CM | POA: Diagnosis not present

## 2015-12-03 DIAGNOSIS — I5042 Chronic combined systolic (congestive) and diastolic (congestive) heart failure: Secondary | ICD-10-CM | POA: Diagnosis not present

## 2015-12-03 DIAGNOSIS — I255 Ischemic cardiomyopathy: Secondary | ICD-10-CM | POA: Diagnosis not present

## 2015-12-03 DIAGNOSIS — I2510 Atherosclerotic heart disease of native coronary artery without angina pectoris: Secondary | ICD-10-CM | POA: Diagnosis not present

## 2015-12-11 DIAGNOSIS — I10 Essential (primary) hypertension: Secondary | ICD-10-CM | POA: Diagnosis not present

## 2015-12-16 DIAGNOSIS — I420 Dilated cardiomyopathy: Secondary | ICD-10-CM | POA: Diagnosis not present

## 2015-12-16 DIAGNOSIS — I255 Ischemic cardiomyopathy: Secondary | ICD-10-CM | POA: Diagnosis not present

## 2015-12-16 DIAGNOSIS — I5042 Chronic combined systolic (congestive) and diastolic (congestive) heart failure: Secondary | ICD-10-CM | POA: Diagnosis not present

## 2015-12-16 DIAGNOSIS — I2510 Atherosclerotic heart disease of native coronary artery without angina pectoris: Secondary | ICD-10-CM | POA: Diagnosis not present

## 2015-12-24 DIAGNOSIS — M109 Gout, unspecified: Secondary | ICD-10-CM | POA: Diagnosis not present

## 2015-12-30 DIAGNOSIS — I5042 Chronic combined systolic (congestive) and diastolic (congestive) heart failure: Secondary | ICD-10-CM | POA: Diagnosis not present

## 2015-12-30 DIAGNOSIS — I2510 Atherosclerotic heart disease of native coronary artery without angina pectoris: Secondary | ICD-10-CM | POA: Diagnosis not present

## 2015-12-30 DIAGNOSIS — I420 Dilated cardiomyopathy: Secondary | ICD-10-CM | POA: Diagnosis not present

## 2015-12-30 DIAGNOSIS — I255 Ischemic cardiomyopathy: Secondary | ICD-10-CM | POA: Diagnosis not present

## 2016-01-14 DIAGNOSIS — I10 Essential (primary) hypertension: Secondary | ICD-10-CM | POA: Diagnosis not present

## 2016-01-21 ENCOUNTER — Ambulatory Visit: Payer: Medicare Other | Admitting: Podiatry

## 2016-01-29 ENCOUNTER — Telehealth: Payer: Self-pay | Admitting: Family Medicine

## 2016-01-29 DIAGNOSIS — I10 Essential (primary) hypertension: Secondary | ICD-10-CM

## 2016-01-29 MED ORDER — METOPROLOL TARTRATE 25 MG PO TABS: ORAL_TABLET | ORAL | Status: DC

## 2016-01-29 NOTE — Telephone Encounter (Signed)
done

## 2016-01-29 NOTE — Telephone Encounter (Signed)
Rcvd refill request for Metoprolol 25 mg #60

## 2016-01-31 ENCOUNTER — Emergency Department (HOSPITAL_COMMUNITY): Payer: Medicare Other

## 2016-01-31 ENCOUNTER — Encounter: Payer: Self-pay | Admitting: Family Medicine

## 2016-01-31 ENCOUNTER — Other Ambulatory Visit: Payer: Self-pay

## 2016-01-31 ENCOUNTER — Encounter (HOSPITAL_COMMUNITY): Payer: Self-pay | Admitting: Emergency Medicine

## 2016-01-31 DIAGNOSIS — I509 Heart failure, unspecified: Secondary | ICD-10-CM | POA: Diagnosis not present

## 2016-01-31 DIAGNOSIS — Z7984 Long term (current) use of oral hypoglycemic drugs: Secondary | ICD-10-CM | POA: Insufficient documentation

## 2016-01-31 DIAGNOSIS — E119 Type 2 diabetes mellitus without complications: Secondary | ICD-10-CM | POA: Insufficient documentation

## 2016-01-31 DIAGNOSIS — I110 Hypertensive heart disease with heart failure: Secondary | ICD-10-CM | POA: Insufficient documentation

## 2016-01-31 DIAGNOSIS — Z79899 Other long term (current) drug therapy: Secondary | ICD-10-CM | POA: Insufficient documentation

## 2016-01-31 DIAGNOSIS — Z7982 Long term (current) use of aspirin: Secondary | ICD-10-CM | POA: Diagnosis not present

## 2016-01-31 DIAGNOSIS — R0602 Shortness of breath: Secondary | ICD-10-CM | POA: Diagnosis not present

## 2016-01-31 DIAGNOSIS — I10 Essential (primary) hypertension: Secondary | ICD-10-CM | POA: Diagnosis not present

## 2016-01-31 DIAGNOSIS — I5023 Acute on chronic systolic (congestive) heart failure: Secondary | ICD-10-CM | POA: Diagnosis not present

## 2016-01-31 LAB — BASIC METABOLIC PANEL
Anion gap: 7 (ref 5–15)
BUN: 13 mg/dL (ref 6–20)
CHLORIDE: 114 mmol/L — AB (ref 101–111)
CO2: 21 mmol/L — ABNORMAL LOW (ref 22–32)
CREATININE: 1.17 mg/dL — AB (ref 0.44–1.00)
Calcium: 9.2 mg/dL (ref 8.9–10.3)
GFR calc non Af Amer: 47 mL/min — ABNORMAL LOW (ref >60)
GFR, EST AFRICAN AMERICAN: 55 mL/min — AB (ref >60)
Glucose, Bld: 214 mg/dL — ABNORMAL HIGH (ref 65–99)
Potassium: 3.3 mmol/L — ABNORMAL LOW (ref 3.5–5.1)
Sodium: 142 mmol/L (ref 135–145)

## 2016-01-31 LAB — CBC
HCT: 32.4 % — ABNORMAL LOW (ref 36.0–46.0)
Hemoglobin: 10.6 g/dL — ABNORMAL LOW (ref 12.0–15.0)
MCH: 29 pg (ref 26.0–34.0)
MCHC: 32.7 g/dL (ref 30.0–36.0)
MCV: 88.8 fL (ref 78.0–100.0)
PLATELETS: 163 10*3/uL (ref 150–400)
RBC: 3.65 MIL/uL — AB (ref 3.87–5.11)
RDW: 14.5 % (ref 11.5–15.5)
WBC: 6.2 10*3/uL (ref 4.0–10.5)

## 2016-01-31 LAB — I-STAT TROPONIN, ED: TROPONIN I, POC: 0 ng/mL (ref 0.00–0.08)

## 2016-01-31 NOTE — ED Notes (Signed)
Pt here with SOB starting today. Pt has hx of CHF-reports taking meds regularly. No cough, fever. No edema. Pt speaking in full sentences. Pt sts "my breathing always acts up when it rains."

## 2016-02-01 ENCOUNTER — Emergency Department (HOSPITAL_COMMUNITY)
Admission: EM | Admit: 2016-02-01 | Discharge: 2016-02-01 | Disposition: A | Discharge status: Home / Self Care | Payer: Medicare Other | Attending: Emergency Medicine | Admitting: Emergency Medicine

## 2016-02-01 DIAGNOSIS — I110 Hypertensive heart disease with heart failure: Secondary | ICD-10-CM | POA: Diagnosis not present

## 2016-02-01 DIAGNOSIS — I5023 Acute on chronic systolic (congestive) heart failure: Secondary | ICD-10-CM

## 2016-02-01 HISTORY — DX: Heart failure, unspecified: I50.9

## 2016-02-01 LAB — BRAIN NATRIURETIC PEPTIDE: B NATRIURETIC PEPTIDE 5: 2232.7 pg/mL — AB (ref 0.0–100.0)

## 2016-02-01 MED ORDER — ASPIRIN 81 MG PO CHEW
324.0000 mg | CHEWABLE_TABLET | Freq: Once | ORAL | Status: AC
  Administered 2016-02-01: 324 mg via ORAL
  Filled 2016-02-01: qty 4

## 2016-02-01 MED ORDER — FUROSEMIDE 40 MG PO TABS: 40.0000 mg | ORAL_TABLET | Freq: Every day | ORAL | Status: DC

## 2016-02-01 MED ORDER — FUROSEMIDE 10 MG/ML IJ SOLN
40.0000 mg | Freq: Once | INTRAMUSCULAR | Status: AC
  Administered 2016-02-01: 40 mg via INTRAMUSCULAR
  Filled 2016-02-01: qty 4

## 2016-02-01 NOTE — ED Notes (Signed)
Spoke to [employer], will add on BNP.

## 2016-02-01 NOTE — Discharge Instructions (Signed)

## 2016-02-01 NOTE — ED Provider Notes (Signed)
TIME SEEN: 4:55 AM  CHIEF COMPLAINT: Shortness of breath  HPI: Pt is a 65 y.o. female with history of hypertension, diabetes, CHF with last echocardiogram showing an EF of 20-25% in 2014 who presents to the emergency department with 2 days of shortness of breath. Shortness of breath is worse with exertion. No chest pain or chest discomfort. No lower extreme swelling. No fever or cough. States this feels like her previous CHF exacerbations. She does not wear oxygen at home. States her primary care physician took her off Lasix 3 months ago. She is on spironolactone. Has an appointment to follow-up with her primary care physician and her cardiologist in July. Her PCP is with [facility] in her cardiologist is Dr. Jacinto Halim.  ROS: See HPI Constitutional: no fever  Eyes: no drainage  ENT: no runny nose   Cardiovascular:  no chest pain  Resp: SOB  GI: no vomiting GU: no dysuria Integumentary: no rash  Allergy: no hives  Musculoskeletal: no leg swelling  Neurological: no slurred speech ROS otherwise negative  PAST MEDICAL HISTORY/PAST SURGICAL HISTORY:  Past Medical History  Diagnosis Date  . Hypertension   . Uncontrolled diabetes mellitus with complications (HCC)   . Bronchitis   . CHF (congestive heart failure) (HCC)     MEDICATIONS:  Prior to Admission medications   Medication Sig Start Date End Date Taking? Authorizing Provider  albuterol (PROVENTIL HFA;VENTOLIN HFA) 108 (90 BASE) MCG/ACT inhaler Inhale 2 puffs into the lungs every 6 (six) hours as needed for wheezing or shortness of breath. 11/07/13 02/26/14  Mellody Drown, PA-C  aspirin 81 MG chewable tablet Chew 1 tablet (81 mg total) by mouth daily. 07/05/13   Richarda Overlie, MD  atorvastatin (LIPITOR) 80 MG tablet Take 1 tablet (80 mg total) by mouth daily. 10/10/13   Richarda Overlie, MD  metFORMIN (GLUCOPHAGE) 500 MG tablet Take 500 mg by mouth 2 (two) times daily with a meal.    Historical Provider, MD  metoprolol tartrate  (LOPRESSOR) 25 MG tablet 1 tablet (25 mg total) by mouth 2 (two) times daily. 01/29/16   Avanell Shackleton, NP  valsartan-hydrochlorothiazide (DIOVAN-HCT) 160-12.5 MG tablet Take 1 tablet by mouth daily.    Historical Provider, MD    ALLERGIES:  Allergies  Allergen Reactions  . Shellfish Allergy Itching    SOCIAL HISTORY:  Social History  Substance Use Topics  . Smoking status: Never Smoker   . Smokeless tobacco: Not on file  . Alcohol Use: No    FAMILY HISTORY: History reviewed. No pertinent family history.  EXAM: BP 177/73 mmHg  Pulse 99  Temp(Src) 97.9 F (36.6 C) (Oral)  Resp 18  Ht  (1.575 m)  Wt 142 lb 3.2 oz (64.5 kg)  BMI 26.00 kg/m2  SpO2 98% CONSTITUTIONAL: Alert and oriented and responds appropriately to questions. Well-appearing; well-nourished HEAD: Normocephalic EYES: Conjunctivae clear, PERRL ENT: normal nose; no rhinorrhea; moist mucous membranes NECK: Supple, no meningismus, no LAD; no appreciable JVD CARD: RRR; S1 and S2 appreciated; no murmurs, no clicks, no rubs, no gallops RESP: Normal chest excursion without splinting or tachypnea; breath sounds clear and equal bilaterally; no wheezes, no rhonchi, no rales, no hypoxia or respiratory distress, speaking full sentences ABD/GI: Normal bowel sounds; non-distended; soft, non-tender, no rebound, no guarding, no peritoneal signs BACK:  The back appears normal and is non-tender to palpation, there is no CVA tenderness EXT: Normal ROM in all joints; non-tender to palpation; no edema; normal capillary refill; no cyanosis, no  calf tenderness or swelling    SKIN: Normal color for age and race; warm; no rash NEURO: Moves all extremities equally, sensation to light touch intact diffusely, cranial nerves II through XII intact PSYCH: The patient's mood and manner are appropriate. Grooming and personal hygiene are appropriate.  MEDICAL DECISION MAKING: Patient here with CHF exacerbation. EKG shows no new ischemic  abnormality. Troponin is negative. BNP is elevated to greater than 2000 and chest x-ray shows mild interstitial edema. No hypoxia with ambulation or at rest. Have offered admission the patient declines. She states she would like to try oral diuresis at home. I feel this is a reasonable plan given she is not hemodynamically stable and has outpatient follow-up with her PCP and cardiologist scheduled. Have recommended changes in her diet including decreasing her sodium and free fluid intake. Will give dose of IM Lasix in the emergency department. We'll discharge with Lasix tablets with instructions for close follow up with her PCP next week. Discussed with her at length return precautions. She verbalized understanding and is comfortable with this plan.    At this time, I do not feel there is any life-threatening condition present. I have reviewed and discussed all results (EKG, imaging, lab, urine as appropriate), exam findings with patient. I have reviewed nursing notes and appropriate previous records.  I feel the patient is safe to be discharged home without further emergent workup. Discussed usual and customary return precautions. Patient and family (if present) verbalize understanding and are comfortable with this plan.  Patient will follow-up with their primary care provider. If they do not have a primary care provider, information for follow-up has been provided to them. All questions have been answered.     EKG Interpretation  Date/Time:  Friday January 31 2016 22:35:16 EDT Ventricular Rate:  82 PR Interval:  214 QRS Duration: 104 QT Interval:  440 QTC Calculation: 514 R Axis:   53 Text Interpretation:  Sinus rhythm with 1st degree A-V block Possible Left atrial enlargement Nonspecific ST and T wave abnormality Prolonged QT Abnormal ECG When compared with ECG of 03/11/2011, There has been improvement in  Nonspecific T wave abnormality Confirmed by [facility]  MD, DAVID ([id number]) on 01/31/2016 10:49:59 PM          Rachel Maw Suriyah Vergara, DO 02/01/16 [id number]

## 2016-02-01 NOTE — ED Notes (Signed)
Pt ambulated around pod on RA. Resting O2 was 95%. Pt denied SOB and stated she "feels much better than when she came in". Lowest O2 was 91% when pt was talking while ambulating.

## 2016-02-13 ENCOUNTER — Ambulatory Visit (INDEPENDENT_AMBULATORY_CARE_PROVIDER_SITE_OTHER): Payer: Medicare Other | Admitting: Family Medicine

## 2016-02-13 ENCOUNTER — Encounter: Payer: Self-pay | Admitting: Family Medicine

## 2016-02-13 VITALS — BP 130/80 | HR 64 | Resp 18 | Ht 62.0 in | Wt 137.6 lb

## 2016-02-13 DIAGNOSIS — E119 Type 2 diabetes mellitus without complications: Secondary | ICD-10-CM | POA: Diagnosis not present

## 2016-02-13 DIAGNOSIS — E785 Hyperlipidemia, unspecified: Secondary | ICD-10-CM | POA: Diagnosis not present

## 2016-02-13 DIAGNOSIS — I10 Essential (primary) hypertension: Secondary | ICD-10-CM | POA: Diagnosis not present

## 2016-02-13 DIAGNOSIS — I5022 Chronic systolic (congestive) heart failure: Secondary | ICD-10-CM

## 2016-02-13 LAB — BASIC METABOLIC PANEL
BUN: 14 mg/dL (ref 7–25)
CO2: 24 mmol/L (ref 20–31)
Calcium: 9.1 mg/dL (ref 8.6–10.4)
Chloride: 107 mmol/L (ref 98–110)
Creat: 1.08 mg/dL — ABNORMAL HIGH (ref 0.50–0.99)
Glucose, Bld: 130 mg/dL — ABNORMAL HIGH (ref 65–99)
POTASSIUM: 4.1 mmol/L (ref 3.5–5.3)
SODIUM: 140 mmol/L (ref 135–146)

## 2016-02-13 LAB — POCT GLYCOSYLATED HEMOGLOBIN (HGB A1C): HEMOGLOBIN A1C: 7.7

## 2016-02-13 MED ORDER — FUROSEMIDE 40 MG PO TABS
40.0000 mg | ORAL_TABLET | Freq: Every day | ORAL | Status: DC | PRN
Start: 2016-02-13 — End: 2016-05-31

## 2016-02-13 MED ORDER — METFORMIN HCL 500 MG PO TABS: 500.0000 mg | ORAL_TABLET | Freq: Two times a day (BID) | ORAL | Status: DC

## 2016-02-13 MED ORDER — POTASSIUM CHLORIDE CRYS ER 20 MEQ PO TBCR: EXTENDED_RELEASE_TABLET | ORAL | Status: DC

## 2016-02-13 NOTE — Patient Instructions (Addendum)
Start taking Metformin 500 mg twice daily. Watch your carbohydrate and sugar intake, read nutrition labels. Continue checking your daily blood sugars and let me know if he notice readings going up.  Follow up with me after October 7th for diabetes.   Take the Lasix when you feel short of breath or if you have swelling in your feet and legs. If you take a Lasix you should also take a potassium tablet. Only take the potassium tablet with Lasix.  Continue on Entresto, Clopidogrel, Metoprolol and per your Cardiologist office. They will refill your spironolactone and check fasting lipids. Remind them to do this.   They will call you today to schedule an appointment.

## 2016-02-13 NOTE — Progress Notes (Signed)
Subjective:    Patient ID: Rachel Oneill, female    DOB: [birth year], 65 y.o.   MRN: [id number]  Rachel Oneill is a 65 y.o. female who presents for follow-up of Type 2 diabetes mellitus. A1C today is 7.7%  She states she has been out of diabetes medications for at least 2 weeks. She states the pharmacy did not follow through on medications. She is also out of atorvastatin (for a couple of years), lasix and Spironolactone. She is not clear which medications she should be taking. She did bring with her 4 medication bottles today and she reports taking Metoprolol, Entresto, Clopidogrel, and Spironolactone.  She did not go for lab visit to get her lipids checked as ordered. Has not had lipids checked since 2014 and stopped her statin.   Is not checking blood pressures at home. States her blood pressure medication was switched to [facility] by her cardiologist and she is also taking metoprolol daily. States she recently had significant swelling, shortness of breath and went to the emergency department and was told she had an acute CHF exacerbation. She states she was eating very unhealthfully prior to this. She was placed on Lasix in the ED and states she is out of this today. States she has an appointment with her cardiologist Dr. Jacinto Halim in August. Denies headache, dizziness, chest pain, palpitations, DOE, orthopnea, abdominal pain, nausea, vomiting, diarrhea, LE edema. States she feels good today.   Patient is checking home blood sugars.   Home blood sugar records: BGs 120s-140s How often is blood sugars being checked: once daily fasting Current symptoms include: none. Patient denies foot ulcerations, hypoglycemia , increased appetite, nausea, paresthesia of the feet, polydipsia, polyuria, visual disturbances, vomiting and weight loss.  Patient is checking their feet daily. Any Foot concerns (callous, ulcer, wound, thickened nails, toenail fungus, skin fungus, hammer toe): none Last dilated eye exam:  2016  Current treatments: has been out of Metformin for past 2 weeks. Medication compliance: poor  Current diet: has been poor but trying to improve Current exercise: none Known diabetic complications: cardiovascular disease  The following portions of the patient's history were reviewed and updated as appropriate: allergies, current medications, past medical history, past social history and problem list.   ROS as in subjective above.     Objective:    Physical Exam Alert and in no distress. Neck is supple without adenopathy or thyromegaly. Cardiac exam shows a regular sinus rhythm without murmurs or gallops. Lungs are clear to auscultation and normal work of breathing. Abdomen soft, nontender, normal BS. LE without edema.    Blood pressure 130/80, pulse 64, resp. rate 18, height 5\' 2"  (1.575 m), weight 137 lb 9.6 oz (62.415 kg).  Lab Review Diabetic Labs Latest Ref Rng 02/13/2016 01/31/2016 09/23/2015 11/07/2013 10/11/2013  HbA1c - 7.7 - 7.0 - 6.9  Microalbumin 0.00-1.89 mg/dL - - - - -  Chol 0 - 093 mg/dL - - - - -  HDL >81 mg/dL - - - - -  Calc LDL 0 - 99 mg/dL - - - - -  Triglycerides <150 mg/dL - - - - -  Creatinine 8.29 - 1.00 mg/dL - 9.37(J) 6.96(V) 8.93 -   BP/Weight 02/13/2016 02/01/2016 01/31/2016 11/04/2015 10/07/2015  Systolic BP 130 173 - 122 124  Diastolic BP 80 98 - 64 64  Wt. (Lbs) 137.6 - 142.2 140 139.6  BMI 25.16 26 - 22.61 22.54   No flowsheet data found.  Rachel Oneill  reports that she has never  smoked. She does not have any smokeless tobacco history on file. She reports that she does not drink alcohol or use illicit drugs.     Assessment & Plan:    Diabetes mellitus without complication (HCC) - Plan: HgB A1c, metFORMIN (GLUCOPHAGE) 500 MG tablet, Basic metabolic panel  Type 2 diabetes mellitus without complication, without long-term current use of insulin (HCC)  Chronic systolic heart failure (HCC) - Plan: furosemide (LASIX) 40 MG tablet, potassium chloride SA  (K-DUR,KLOR-CON) 20 MEQ tablet  Hyperlipidemia LDL goal <100  Essential hypertension   Rx changes: Metformin refilled. Lasix refilled to take PRN for edema and shortness of breath and added potassium supplement 20 mEq to take only with Lasix   She will be followed closely by Dr. Verl Dicker office. Spoke with NP Marcy Salvo and reviewed medications that patient should be taking and made her aware of patient's recent CHF exacerbation and ED visit. She states she will get patient in sooner than her scheduled August appointment. She will coorelate with patient.   She will need fasting lipids at cardiologist appointment or can call and come back here for this. She is not fasting today. She will need to be placed back on a statin at that point most likely.   Education: Reviewed 'ABCs' of diabetes management (respective goals in parentheses):  A1C (<7), blood pressure (<130/80), and cholesterol (LDL <100).  Compliance at present is estimated to be fair. Efforts to improve compliance (if necessary) will be directed at dietary modifications: read labels and watch sugar and carbohydrate intake, increased exercise, regular blood sugar monitoring: daily and strict medication adherance. .  Continue on daily medication regimen for HTN. She is within goal today. She will follow up with cardiology for recommendation for this.   Follow up: 3 months

## 2016-02-24 DIAGNOSIS — I2510 Atherosclerotic heart disease of native coronary artery without angina pectoris: Secondary | ICD-10-CM | POA: Diagnosis not present

## 2016-02-24 DIAGNOSIS — I420 Dilated cardiomyopathy: Secondary | ICD-10-CM | POA: Diagnosis not present

## 2016-02-24 DIAGNOSIS — I255 Ischemic cardiomyopathy: Secondary | ICD-10-CM | POA: Diagnosis not present

## 2016-02-24 DIAGNOSIS — I5042 Chronic combined systolic (congestive) and diastolic (congestive) heart failure: Secondary | ICD-10-CM | POA: Diagnosis not present

## 2016-02-26 ENCOUNTER — Other Ambulatory Visit: Payer: Self-pay | Admitting: Family Medicine

## 2016-02-26 ENCOUNTER — Encounter: Payer: Self-pay | Admitting: Family Medicine

## 2016-03-02 ENCOUNTER — Ambulatory Visit (INDEPENDENT_AMBULATORY_CARE_PROVIDER_SITE_OTHER): Payer: Medicare Other

## 2016-03-02 ENCOUNTER — Encounter: Payer: Self-pay | Admitting: Podiatry

## 2016-03-02 ENCOUNTER — Ambulatory Visit (INDEPENDENT_AMBULATORY_CARE_PROVIDER_SITE_OTHER): Payer: Medicare Other | Admitting: Podiatry

## 2016-03-02 VITALS — BP 137/78 | HR 78

## 2016-03-02 DIAGNOSIS — M21619 Bunion of unspecified foot: Secondary | ICD-10-CM | POA: Diagnosis not present

## 2016-03-02 DIAGNOSIS — E119 Type 2 diabetes mellitus without complications: Secondary | ICD-10-CM

## 2016-03-02 DIAGNOSIS — M779 Enthesopathy, unspecified: Secondary | ICD-10-CM | POA: Diagnosis not present

## 2016-03-02 MED ORDER — TRIAMCINOLONE ACETONIDE 10 MG/ML IJ SUSP
10.0000 mg | Freq: Once | INTRAMUSCULAR | Status: AC
  Administered 2016-03-02: 10 mg

## 2016-03-02 NOTE — Progress Notes (Signed)
   Subjective:    Patient ID: Rachel Oneill, female    DOB: 12-Dec-1950, 65 y.o.   MRN: [id number]  HPI    Review of Systems     Objective:   Physical Exam        Assessment & Plan:

## 2016-03-03 ENCOUNTER — Other Ambulatory Visit: Payer: Self-pay | Admitting: Family Medicine

## 2016-03-03 DIAGNOSIS — I10 Essential (primary) hypertension: Secondary | ICD-10-CM

## 2016-03-04 NOTE — Progress Notes (Signed)
Subjective:     Patient ID: Rachel Oneill, female   DOB: 1951-07-17, 65 y.o.   MRN: [id number]  Foot Pain   Patient states I'm having a lot of pain in my foot and it's been present for several months patient points to around the first MPJ and states it's gradually making it hard to wear shoe gear   Review of Systems  All other systems reviewed and are negative.      Objective:   Physical Exam  Constitutional: She is oriented to person, place, and time.  Cardiovascular: Intact distal pulses.   Musculoskeletal: Normal range of motion.  Neurological: She is oriented to person, place, and time.  Skin: Skin is warm and dry.  Nursing note and vitals reviewed.  neurovascular status intact muscle strength adequate with inflammation fluid around the first MPJ right that is painful when pressed and makes shoe gear difficult. Patient does have slight reduction range of motion but no crepitus within the joint and appears to be more due to pain. 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 At this point I did recommended careful injection and it's possible it we'll need to be fixed at one point in the future. Patient wants procedure understanding risk and I infiltrated 3 mg dexamethasone Kenalog 5 mg Xylocaine advised on wider shoes and reappoint to recheck  X-ray report indicates there is structural malalignment but no indication stress fracture arthritis

## 2016-03-17 DIAGNOSIS — I255 Ischemic cardiomyopathy: Secondary | ICD-10-CM | POA: Diagnosis not present

## 2016-03-20 ENCOUNTER — Encounter: Payer: Self-pay | Admitting: Family Medicine

## 2016-04-07 DIAGNOSIS — I2510 Atherosclerotic heart disease of native coronary artery without angina pectoris: Secondary | ICD-10-CM | POA: Diagnosis not present

## 2016-04-07 DIAGNOSIS — I255 Ischemic cardiomyopathy: Secondary | ICD-10-CM | POA: Diagnosis not present

## 2016-04-07 DIAGNOSIS — I420 Dilated cardiomyopathy: Secondary | ICD-10-CM | POA: Diagnosis not present

## 2016-04-07 DIAGNOSIS — I5042 Chronic combined systolic (congestive) and diastolic (congestive) heart failure: Secondary | ICD-10-CM | POA: Diagnosis not present

## 2016-05-01 ENCOUNTER — Ambulatory Visit (INDEPENDENT_AMBULATORY_CARE_PROVIDER_SITE_OTHER): Payer: Medicare Other | Admitting: Podiatry

## 2016-05-01 ENCOUNTER — Encounter: Payer: Self-pay | Admitting: Podiatry

## 2016-05-01 DIAGNOSIS — R600 Localized edema: Secondary | ICD-10-CM | POA: Diagnosis not present

## 2016-05-01 DIAGNOSIS — M21619 Bunion of unspecified foot: Secondary | ICD-10-CM

## 2016-05-01 DIAGNOSIS — M779 Enthesopathy, unspecified: Secondary | ICD-10-CM

## 2016-05-04 NOTE — Progress Notes (Signed)
Subjective:     Patient ID: Rachel Oneill, female   DOB: [birth year], 65 y.o.   MRN: [id number]  HPI patient presents on left foot stating that the pain has reduced but she still having some swelling   Review of Systems     Objective:   Physical Exam Neurovascular status intact negative Homan sign was noted with edema in the left forefoot and into the ankle left localized with +1 pitting edema with mild to moderate discomfort    Assessment:     Improvement of tendinitis-like symptoms structural bunion deformity with patient found to have residual edema in the foot with no indications currently of clotting    Plan:     Advised her on what to watch for for any form of clotting and that if anything should occur that she is to go straight to the emergency room and contact us. Today I applied Unna boot Ace wrap along with surgical shoe and gave instructions on removing this after several weeks and she will be seen back to recheck again if the swelling does not go away

## 2016-05-11 ENCOUNTER — Other Ambulatory Visit: Payer: Self-pay | Admitting: Internal Medicine

## 2016-05-31 ENCOUNTER — Emergency Department (HOSPITAL_COMMUNITY)
Admission: EM | Admit: 2016-05-31 | Discharge: 2016-05-31 | Disposition: A | Discharge status: Home / Self Care | Payer: Medicare Other | Attending: Emergency Medicine | Admitting: Emergency Medicine

## 2016-05-31 ENCOUNTER — Encounter (HOSPITAL_COMMUNITY): Payer: Self-pay | Admitting: *Deleted

## 2016-05-31 ENCOUNTER — Emergency Department (HOSPITAL_COMMUNITY): Payer: Medicare Other

## 2016-05-31 ENCOUNTER — Other Ambulatory Visit: Payer: Self-pay

## 2016-05-31 DIAGNOSIS — R609 Edema, unspecified: Secondary | ICD-10-CM | POA: Diagnosis not present

## 2016-05-31 DIAGNOSIS — Z7982 Long term (current) use of aspirin: Secondary | ICD-10-CM | POA: Insufficient documentation

## 2016-05-31 DIAGNOSIS — A599 Trichomoniasis, unspecified: Secondary | ICD-10-CM | POA: Diagnosis not present

## 2016-05-31 DIAGNOSIS — Z951 Presence of aortocoronary bypass graft: Secondary | ICD-10-CM | POA: Insufficient documentation

## 2016-05-31 DIAGNOSIS — Z87891 Personal history of nicotine dependence: Secondary | ICD-10-CM | POA: Insufficient documentation

## 2016-05-31 DIAGNOSIS — M7989 Other specified soft tissue disorders: Secondary | ICD-10-CM | POA: Diagnosis present

## 2016-05-31 DIAGNOSIS — I110 Hypertensive heart disease with heart failure: Secondary | ICD-10-CM | POA: Diagnosis not present

## 2016-05-31 DIAGNOSIS — N3000 Acute cystitis without hematuria: Secondary | ICD-10-CM | POA: Insufficient documentation

## 2016-05-31 DIAGNOSIS — E1169 Type 2 diabetes mellitus with other specified complication: Secondary | ICD-10-CM | POA: Insufficient documentation

## 2016-05-31 DIAGNOSIS — I2510 Atherosclerotic heart disease of native coronary artery without angina pectoris: Secondary | ICD-10-CM | POA: Diagnosis not present

## 2016-05-31 DIAGNOSIS — Z7984 Long term (current) use of oral hypoglycemic drugs: Secondary | ICD-10-CM | POA: Diagnosis not present

## 2016-05-31 DIAGNOSIS — I5022 Chronic systolic (congestive) heart failure: Secondary | ICD-10-CM | POA: Diagnosis not present

## 2016-05-31 DIAGNOSIS — R05 Cough: Secondary | ICD-10-CM | POA: Diagnosis not present

## 2016-05-31 DIAGNOSIS — R600 Localized edema: Secondary | ICD-10-CM | POA: Diagnosis not present

## 2016-05-31 DIAGNOSIS — R0602 Shortness of breath: Secondary | ICD-10-CM | POA: Diagnosis not present

## 2016-05-31 LAB — BASIC METABOLIC PANEL
Anion gap: 9 (ref 5–15)
BUN: 17 mg/dL (ref 6–20)
CALCIUM: 9.3 mg/dL (ref 8.9–10.3)
CO2: 19 mmol/L — ABNORMAL LOW (ref 22–32)
CREATININE: 1.17 mg/dL — AB (ref 0.44–1.00)
Chloride: 114 mmol/L — ABNORMAL HIGH (ref 101–111)
GFR calc Af Amer: 55 mL/min — ABNORMAL LOW (ref >60)
GFR, EST NON AFRICAN AMERICAN: 47 mL/min — AB (ref >60)
Glucose, Bld: 147 mg/dL — ABNORMAL HIGH (ref 65–99)
Potassium: 3.7 mmol/L (ref 3.5–5.1)
SODIUM: 142 mmol/L (ref 135–145)

## 2016-05-31 LAB — CBC WITH DIFFERENTIAL/PLATELET
Basophils Absolute: 0 10*3/uL (ref 0.0–0.1)
Basophils Relative: 0 %
EOS ABS: 0.1 10*3/uL (ref 0.0–0.7)
EOS PCT: 2 %
HCT: 35.4 % — ABNORMAL LOW (ref 36.0–46.0)
Hemoglobin: 11.6 g/dL — ABNORMAL LOW (ref 12.0–15.0)
LYMPHS ABS: 1.3 10*3/uL (ref 0.7–4.0)
Lymphocytes Relative: 22 %
MCH: 31.9 pg (ref 26.0–34.0)
MCHC: 32.8 g/dL (ref 30.0–36.0)
MCV: 97.3 fL (ref 78.0–100.0)
MONOS PCT: 13 %
Monocytes Absolute: 0.8 10*3/uL (ref 0.1–1.0)
Neutro Abs: 3.8 10*3/uL (ref 1.7–7.7)
Neutrophils Relative %: 63 %
PLATELETS: 173 10*3/uL (ref 150–400)
RBC: 3.64 MIL/uL — ABNORMAL LOW (ref 3.87–5.11)
RDW: 15.2 % (ref 11.5–15.5)
WBC: 6 10*3/uL (ref 4.0–10.5)

## 2016-05-31 LAB — URINALYSIS, ROUTINE W REFLEX MICROSCOPIC
BILIRUBIN URINE: NEGATIVE
Glucose, UA: NEGATIVE mg/dL
Ketones, ur: NEGATIVE mg/dL
Nitrite: NEGATIVE
PROTEIN: 100 mg/dL — AB
Specific Gravity, Urine: 1.021 (ref 1.005–1.030)
pH: 6.5 (ref 5.0–8.0)

## 2016-05-31 LAB — URINE MICROSCOPIC-ADD ON

## 2016-05-31 LAB — BRAIN NATRIURETIC PEPTIDE: B Natriuretic Peptide: 2873.6 pg/mL — ABNORMAL HIGH (ref 0.0–100.0)

## 2016-05-31 LAB — TROPONIN I
TROPONIN I: 0.05 ng/mL — AB (ref <0.03)
TROPONIN I: 0.04 ng/mL — AB (ref <0.03)

## 2016-05-31 MED ORDER — CEPHALEXIN 250 MG PO CAPS
500.0000 mg | ORAL_CAPSULE | Freq: Once | ORAL | Status: AC
  Administered 2016-05-31: 500 mg via ORAL
  Filled 2016-05-31: qty 2

## 2016-05-31 MED ORDER — CEPHALEXIN 500 MG PO CAPS: 500.0000 mg | ORAL_CAPSULE | Freq: Four times a day (QID) | ORAL | 0 refills | Status: DC

## 2016-05-31 MED ORDER — METRONIDAZOLE 500 MG PO TABS
2000.0000 mg | ORAL_TABLET | Freq: Once | ORAL | Status: AC
  Administered 2016-05-31: 2000 mg via ORAL
  Filled 2016-05-31: qty 4

## 2016-05-31 MED ORDER — FUROSEMIDE 20 MG PO TABS
40.0000 mg | ORAL_TABLET | Freq: Once | ORAL | Status: AC
  Administered 2016-05-31: 40 mg via ORAL
  Filled 2016-05-31: qty 2

## 2016-05-31 MED ORDER — FUROSEMIDE 40 MG PO TABS: 40.0000 mg | ORAL_TABLET | Freq: Every day | ORAL | 0 refills | Status: DC | PRN

## 2016-05-31 NOTE — ED Triage Notes (Signed)
Pt states she woke up this am and noticed both feet were really swollen (has noticed for several days, but today was much worse).  Breathing is labored, though pt believes it is phlegm.  VS stable.

## 2016-05-31 NOTE — ED Notes (Addendum)
Pt taken off monitor to walk to bathroom.  Aware we are waiting for results of 2nd troponin.

## 2016-05-31 NOTE — ED Provider Notes (Signed)
[facility]-EMERGENCY DEPT Provider Note   CSN: [id number] Arrival date & time: 05/31/16  [id number]     History   Chief Complaint Chief Complaint  Patient presents with  . Leg Swelling  . Shortness of Breath    HPI Althea CharonLeslie R Massimo is a 65 y.o. female.  Pt presents to the ED today with swollen legs that she has noticed for the past few days.  The pt does have lasix, but does not take it regularly.  She said her doctor told her to take it prn.  The last dose was Tuesday the 17th.  Pt has a little SOB, but it is not bad.      Past Medical History:  Diagnosis Date  . Bronchitis   . CHF (congestive heart failure) (HCC)   . Hypertension   . Uncontrolled diabetes mellitus with complications [facility](HCC)     Patient Active Problem List   Diagnosis Date Noted  . AAA (abdominal aortic aneurysm) without rupture (HCC) 11/04/2015  . PAD (peripheral artery disease) (HCC) 11/04/2015  . Chronic systolic heart failure (HCC) 10/11/2013  . CAD (coronary artery disease) of artery bypass graft 07/12/2013  . Hyperlipidemia LDL goal <100 07/12/2013  . Essential hypertension 07/12/2013  . Type 2 diabetes mellitus without complication (HCC) 07/12/2013  . Mitral regurgitation 07/12/2013    Past Surgical History:  Procedure Laterality Date  . CARDIAC SURGERY     double bypass 2007    OB History    No data available       Home Medications    Prior to Admission medications   Medication Sig Start Date End Date Taking? Authorizing Provider  albuterol (PROVENTIL HFA;VENTOLIN HFA) 108 (90 Base) MCG/ACT inhaler Inhale 1 puff into the lungs every 6 (six) hours as needed for wheezing or shortness of breath.   Yes Historical Provider, MD  aspirin 81 MG chewable tablet Chew 1 tablet (81 mg total) by mouth daily. 07/05/13  Yes Richarda OverlieNayana Abrol, MD  atorvastatin (LIPITOR) 80 MG tablet Take 1 tablet (80 mg total) by mouth daily. 10/10/13  Yes Richarda OverlieNayana Abrol, MD  ENTRESTO 97-103 MG Take 1 tablet by mouth 2 (two) times  daily.  01/24/16  Yes Historical Provider, MD  metFORMIN (GLUCOPHAGE) 500 MG tablet Take 1 tablet (500 mg total) by mouth 2 (two) times daily with a meal. Reported on 02/13/2016 02/13/16  Yes Avanell ShackletonVickie L Henson, NP  metoprolol tartrate (LOPRESSOR) 25 MG tablet TAKE ONE TABLET BY MOUTH TWICE DAILY 03/03/16  Yes Avanell ShackletonVickie L Henson, NP  potassium chloride SA (K-DUR,KLOR-CON) 20 MEQ tablet Take 1 tablet (20 mEq total) when you take Lasix 02/13/16  Yes Vickie L Henson, NP  cephALEXin (KEFLEX) 500 MG capsule Take 1 capsule (500 mg total) by mouth 4 (four) times daily. 05/31/16   Jacalyn LefevreJulie Raygan Skarda, MD  clopidogrel (PLAVIX) 75 MG tablet Take 75 mg by mouth daily.  01/29/16   Historical Provider, MD  furosemide (LASIX) 40 MG tablet Take 1 tablet (40 mg total) by mouth daily as needed. 05/31/16   Jacalyn LefevreJulie Amira Podolak, MD  indomethacin (INDOCIN) 25 MG capsule Take 25 mg by mouth 3 (three) times daily as needed.    Historical Provider, MD  spironolactone (ALDACTONE) 25 MG tablet Take 25 mg by mouth daily.  01/16/16   Historical Provider, MD    Family History No family history on file.  Social History Social History  Substance Use Topics  . Smoking status: Former Smoker    Types: Cigarettes    Quit date: 05/31/2006  .  Smokeless tobacco: Never Used  . Alcohol use No     Allergies   Shellfish allergy   Review of Systems Review of Systems  Respiratory: Positive for shortness of breath.   Cardiovascular: Positive for leg swelling.  All other systems reviewed and are negative.    Physical Exam Updated Vital Signs BP 143/79   Pulse 76   Temp 97.8 F (36.6 C) (Oral)   Resp 16   Ht 5\' 4"  (1.626 m)   Wt 148 lb 13 oz (67.5 kg)   SpO2 93%   BMI 25.54 kg/m   Physical Exam  Constitutional: She is oriented to person, place, and time. She appears well-developed and well-nourished.  HENT:  Head: Normocephalic and atraumatic.  Right Ear: External ear normal.  Left Ear: External ear normal.  Nose: Nose normal.    Mouth/Throat: Oropharynx is clear and moist.  Eyes: Conjunctivae and EOM are normal. Pupils are equal, round, and reactive to light.  Neck: Normal range of motion. Neck supple.  Cardiovascular: Normal rate, regular rhythm, normal heart sounds and intact distal pulses.   Pulmonary/Chest: Effort normal and breath sounds normal.  Abdominal: Soft. Bowel sounds are normal.  Musculoskeletal: She exhibits edema.  Neurological: She is alert and oriented to person, place, and time.  Skin: Skin is warm.  Psychiatric: She has a normal mood and affect. Her behavior is normal. Judgment and thought content normal.  Nursing note and vitals reviewed.    ED Treatments / Results  Labs (all labs ordered are listed, but only abnormal results are displayed) Labs Reviewed  CBC WITH DIFFERENTIAL/PLATELET - Abnormal; Notable for the following:       Result Value   RBC 3.64 (*)    Hemoglobin 11.6 (*)    HCT 35.4 (*)    All other components within normal limits  BASIC METABOLIC PANEL - Abnormal; Notable for the following:    Chloride 114 (*)    CO2 19 (*)    Glucose, Bld 147 (*)    Creatinine, Ser 1.17 (*)    GFR calc non Af Amer 47 (*)    GFR calc Af Amer 55 (*)    All other components within normal limits  URINALYSIS, ROUTINE W REFLEX MICROSCOPIC (NOT AT [facility]) - Abnormal; Notable for the following:    APPearance HAZY (*)    Hgb urine dipstick MODERATE (*)    Protein, ur 100 (*)    Leukocytes, UA MODERATE (*)    All other components within normal limits  TROPONIN I - Abnormal; Notable for the following:    Troponin I 0.05 (*)    All other components within normal limits  BRAIN NATRIURETIC PEPTIDE - Abnormal; Notable for the following:    B Natriuretic Peptide 2,873.6 (*)    All other components within normal limits  TROPONIN I - Abnormal; Notable for the following:    Troponin I 0.04 (*)    All other components within normal limits  URINE MICROSCOPIC-ADD ON - Abnormal; Notable for the  following:    Squamous Epithelial / LPF 6-30 (*)    Bacteria, UA MANY (*)    All other components within normal limits    EKG  EKG Interpretation  Date/Time:  Sunday May 31 2016 08:35:58 EDT Ventricular Rate:  80 PR Interval:    QRS Duration: 90 QT Interval:  499 QTC Calculation: 576 R Axis:   41 Text Interpretation:  Sinus rhythm Atrial premature complexes Borderline prolonged PR interval Abnormal R-wave progression, early transition  Borderline T wave abnormalities Prolonged QT interval No significant change since last tracing Confirmed by [facility] MD, Dashun Borre ([id number]) on 05/31/2016 8:49:14 AM       Radiology Dg Chest 2 View  Result Date: 05/31/2016 CLINICAL DATA:  65 year old female with bilateral leg and feet swelling and some shortness of breath the past 4 days. Cough with mucus production for the past 4 days. EXAM: CHEST  2 VIEW COMPARISON:  Chest x-ray 01/31/2016. FINDINGS: Diffuse peribronchial cuffing, similar to prior examinations. Lung volumes are normal. No consolidative airspace disease. No pleural effusions. No evidence of pulmonary edema. Heart size is mildly enlarged. Upper mediastinal contours are within normal limits. Status post median sternotomy for CABG and mitral annuloplasty. IMPRESSION: 1. Diffuse peribronchial cuffing which appears to be chronic in this individual, suggestive of chronic bronchitis. 2. Mild cardiomegaly. 3. Aortic atherosclerosis. Electronically Signed   By: Trudie Reed M.D.   On: 05/31/2016 09:29    Procedures Procedures (including critical care time)  Medications Ordered in ED Medications  furosemide (LASIX) tablet 40 mg (40 mg Oral Given 05/31/16 1005)  cephALEXin (KEFLEX) capsule 500 mg (500 mg Oral Given 05/31/16 1141)  metroNIDAZOLE (FLAGYL) tablet 2,000 mg (2,000 mg Oral Given 05/31/16 1141)     Initial Impression / Assessment and Plan / ED Course  I have reviewed the triage vital signs and the nursing notes.  Pertinent  labs & imaging results that were available during my care of the patient were reviewed by me and considered in my medical decision making (see chart for details).  Clinical Course   Pt needs to take her lasix a little more frequently than she's been taking it.  I will have her take it for 2 days in a row, then qod.  The pt is also encouraged to wear compression hose.  Delta troponin reassuring.  Pt knows to return if worse and to f/u with her pcp.  Final Clinical Impressions(s) / ED Diagnoses   Final diagnoses:  Acute cystitis without hematuria  Trichomoniasis  Peripheral edema    New Prescriptions New Prescriptions   CEPHALEXIN (KEFLEX) 500 MG CAPSULE    Take 1 capsule (500 mg total) by mouth 4 (four) times daily.     Jacalyn Lefevre, MD 05/31/16 1310

## 2016-06-01 ENCOUNTER — Other Ambulatory Visit: Payer: Self-pay | Admitting: Family Medicine

## 2016-06-01 DIAGNOSIS — I5022 Chronic systolic (congestive) heart failure: Secondary | ICD-10-CM

## 2016-06-03 ENCOUNTER — Ambulatory Visit: Payer: Medicare Other | Admitting: Podiatry

## 2016-06-04 ENCOUNTER — Ambulatory Visit (INDEPENDENT_AMBULATORY_CARE_PROVIDER_SITE_OTHER): Payer: Medicare Other | Admitting: Podiatry

## 2016-06-04 DIAGNOSIS — M79669 Pain in unspecified lower leg: Secondary | ICD-10-CM

## 2016-06-04 DIAGNOSIS — M7989 Other specified soft tissue disorders: Secondary | ICD-10-CM | POA: Diagnosis not present

## 2016-06-04 DIAGNOSIS — R600 Localized edema: Secondary | ICD-10-CM

## 2016-06-14 NOTE — Progress Notes (Signed)
Subjective:  Patient presents today for swelling to the lower extremities. Patient went to the emergency department on 05/31/2016 when she experienced swelling all went to the level of her knees bilaterally. At that time the patient was placed on diuretics Patient presents today for further treatment and evaluation    Objective/Physical Exam General: The patient is alert and oriented x3 in no acute distress.  Dermatology: Skin is warm, dry and supple bilateral lower extremities. Negative for open lesions or macerations.  Vascular: Significant lower extremity edema noted bilaterally extending to the mid level of the leg. No open ulcerations noted.  Neurological: Epicritic and protective threshold grossly intact bilaterally.   Musculoskeletal Exam: Range of motion within normal limits to all pedal and ankle joints bilateral. Muscle strength 5/5 in all groups bilateral.   Radiographic Exam:  Normal osseous mineralization. Joint spaces preserved. No fracture/dislocation/boney destruction.    Assessment: #1 bilateral lower extremity edema #2 pain in bilateral lower extremities   Plan of Care:  #1 Patient was evaluated. #2 prescription for compression stockings #3 referral for a vascular consult ordered #4 patient is to return to clinic when necessary   Dr. Felecia Shelling, DPM [facility]

## 2016-06-21 DIAGNOSIS — I517 Cardiomegaly: Secondary | ICD-10-CM | POA: Diagnosis not present

## 2016-06-21 DIAGNOSIS — I10 Essential (primary) hypertension: Secondary | ICD-10-CM | POA: Diagnosis not present

## 2016-06-21 DIAGNOSIS — E785 Hyperlipidemia, unspecified: Secondary | ICD-10-CM | POA: Diagnosis not present

## 2016-06-21 DIAGNOSIS — R600 Localized edema: Secondary | ICD-10-CM | POA: Diagnosis not present

## 2016-06-21 DIAGNOSIS — E119 Type 2 diabetes mellitus without complications: Secondary | ICD-10-CM | POA: Diagnosis not present

## 2016-06-21 DIAGNOSIS — R2241 Localized swelling, mass and lump, right lower limb: Secondary | ICD-10-CM | POA: Diagnosis not present

## 2016-06-21 DIAGNOSIS — E876 Hypokalemia: Secondary | ICD-10-CM | POA: Diagnosis not present

## 2016-06-21 DIAGNOSIS — R0602 Shortness of breath: Secondary | ICD-10-CM | POA: Diagnosis not present

## 2016-06-21 DIAGNOSIS — J811 Chronic pulmonary edema: Secondary | ICD-10-CM | POA: Diagnosis not present

## 2016-06-21 DIAGNOSIS — I110 Hypertensive heart disease with heart failure: Secondary | ICD-10-CM | POA: Diagnosis not present

## 2016-06-21 DIAGNOSIS — I5023 Acute on chronic systolic (congestive) heart failure: Secondary | ICD-10-CM | POA: Diagnosis not present

## 2016-06-21 DIAGNOSIS — I472 Ventricular tachycardia: Secondary | ICD-10-CM | POA: Diagnosis not present

## 2016-06-21 DIAGNOSIS — R2242 Localized swelling, mass and lump, left lower limb: Secondary | ICD-10-CM | POA: Diagnosis not present

## 2016-06-21 DIAGNOSIS — R0600 Dyspnea, unspecified: Secondary | ICD-10-CM | POA: Diagnosis not present

## 2016-06-21 DIAGNOSIS — R0989 Other specified symptoms and signs involving the circulatory and respiratory systems: Secondary | ICD-10-CM | POA: Diagnosis not present

## 2016-06-21 DIAGNOSIS — I509 Heart failure, unspecified: Secondary | ICD-10-CM | POA: Diagnosis not present

## 2016-06-22 DIAGNOSIS — R600 Localized edema: Secondary | ICD-10-CM | POA: Diagnosis not present

## 2016-06-22 DIAGNOSIS — Z91013 Allergy to seafood: Secondary | ICD-10-CM | POA: Diagnosis not present

## 2016-06-22 DIAGNOSIS — E785 Hyperlipidemia, unspecified: Secondary | ICD-10-CM | POA: Diagnosis present

## 2016-06-22 DIAGNOSIS — I472 Ventricular tachycardia: Secondary | ICD-10-CM | POA: Diagnosis not present

## 2016-06-22 DIAGNOSIS — J811 Chronic pulmonary edema: Secondary | ICD-10-CM | POA: Diagnosis not present

## 2016-06-22 DIAGNOSIS — E119 Type 2 diabetes mellitus without complications: Secondary | ICD-10-CM | POA: Diagnosis present

## 2016-06-22 DIAGNOSIS — I5023 Acute on chronic systolic (congestive) heart failure: Secondary | ICD-10-CM | POA: Diagnosis present

## 2016-06-22 DIAGNOSIS — I5033 Acute on chronic diastolic (congestive) heart failure: Secondary | ICD-10-CM | POA: Diagnosis not present

## 2016-06-22 DIAGNOSIS — E876 Hypokalemia: Secondary | ICD-10-CM | POA: Diagnosis present

## 2016-06-22 DIAGNOSIS — Z955 Presence of coronary angioplasty implant and graft: Secondary | ICD-10-CM | POA: Diagnosis not present

## 2016-06-22 DIAGNOSIS — R0602 Shortness of breath: Secondary | ICD-10-CM | POA: Diagnosis not present

## 2016-06-22 DIAGNOSIS — R079 Chest pain, unspecified: Secondary | ICD-10-CM | POA: Diagnosis not present

## 2016-06-22 DIAGNOSIS — R0989 Other specified symptoms and signs involving the circulatory and respiratory systems: Secondary | ICD-10-CM | POA: Diagnosis not present

## 2016-06-22 DIAGNOSIS — Z951 Presence of aortocoronary bypass graft: Secondary | ICD-10-CM | POA: Diagnosis not present

## 2016-06-22 DIAGNOSIS — E109 Type 1 diabetes mellitus without complications: Secondary | ICD-10-CM | POA: Diagnosis not present

## 2016-06-22 DIAGNOSIS — I10 Essential (primary) hypertension: Secondary | ICD-10-CM | POA: Diagnosis not present

## 2016-06-22 DIAGNOSIS — I2510 Atherosclerotic heart disease of native coronary artery without angina pectoris: Secondary | ICD-10-CM | POA: Diagnosis not present

## 2016-06-22 DIAGNOSIS — Z87891 Personal history of nicotine dependence: Secondary | ICD-10-CM | POA: Diagnosis not present

## 2016-06-22 DIAGNOSIS — I509 Heart failure, unspecified: Secondary | ICD-10-CM | POA: Diagnosis not present

## 2016-06-22 DIAGNOSIS — I5031 Acute diastolic (congestive) heart failure: Secondary | ICD-10-CM | POA: Diagnosis not present

## 2016-06-22 DIAGNOSIS — J9811 Atelectasis: Secondary | ICD-10-CM | POA: Diagnosis not present

## 2016-06-22 DIAGNOSIS — Z7984 Long term (current) use of oral hypoglycemic drugs: Secondary | ICD-10-CM | POA: Diagnosis not present

## 2016-06-22 DIAGNOSIS — I110 Hypertensive heart disease with heart failure: Secondary | ICD-10-CM | POA: Diagnosis present

## 2016-06-22 DIAGNOSIS — I252 Old myocardial infarction: Secondary | ICD-10-CM | POA: Diagnosis not present

## 2016-06-24 DIAGNOSIS — I214 Non-ST elevation (NSTEMI) myocardial infarction: Secondary | ICD-10-CM | POA: Diagnosis not present

## 2016-06-24 DIAGNOSIS — E109 Type 1 diabetes mellitus without complications: Secondary | ICD-10-CM | POA: Diagnosis not present

## 2016-06-24 DIAGNOSIS — I509 Heart failure, unspecified: Secondary | ICD-10-CM | POA: Diagnosis not present

## 2016-06-24 DIAGNOSIS — Z23 Encounter for immunization: Secondary | ICD-10-CM | POA: Diagnosis not present

## 2016-06-24 DIAGNOSIS — I272 Pulmonary hypertension, unspecified: Secondary | ICD-10-CM | POA: Diagnosis present

## 2016-06-24 DIAGNOSIS — I2582 Chronic total occlusion of coronary artery: Secondary | ICD-10-CM | POA: Diagnosis present

## 2016-06-24 DIAGNOSIS — I2510 Atherosclerotic heart disease of native coronary artery without angina pectoris: Secondary | ICD-10-CM | POA: Diagnosis not present

## 2016-06-24 DIAGNOSIS — I5033 Acute on chronic diastolic (congestive) heart failure: Secondary | ICD-10-CM | POA: Diagnosis not present

## 2016-06-24 DIAGNOSIS — I5031 Acute diastolic (congestive) heart failure: Secondary | ICD-10-CM | POA: Diagnosis not present

## 2016-06-24 DIAGNOSIS — I110 Hypertensive heart disease with heart failure: Secondary | ICD-10-CM | POA: Diagnosis present

## 2016-06-24 DIAGNOSIS — E785 Hyperlipidemia, unspecified: Secondary | ICD-10-CM | POA: Diagnosis present

## 2016-06-24 DIAGNOSIS — E876 Hypokalemia: Secondary | ICD-10-CM | POA: Diagnosis present

## 2016-06-24 DIAGNOSIS — I472 Ventricular tachycardia: Secondary | ICD-10-CM | POA: Diagnosis not present

## 2016-06-24 DIAGNOSIS — R600 Localized edema: Secondary | ICD-10-CM | POA: Diagnosis not present

## 2016-06-24 DIAGNOSIS — I10 Essential (primary) hypertension: Secondary | ICD-10-CM | POA: Diagnosis not present

## 2016-06-24 DIAGNOSIS — R0602 Shortness of breath: Secondary | ICD-10-CM | POA: Diagnosis not present

## 2016-06-24 DIAGNOSIS — I25110 Atherosclerotic heart disease of native coronary artery with unstable angina pectoris: Secondary | ICD-10-CM | POA: Diagnosis not present

## 2016-06-24 DIAGNOSIS — Z7982 Long term (current) use of aspirin: Secondary | ICD-10-CM | POA: Diagnosis not present

## 2016-06-24 DIAGNOSIS — Z743 Need for continuous supervision: Secondary | ICD-10-CM | POA: Diagnosis not present

## 2016-06-24 DIAGNOSIS — Z951 Presence of aortocoronary bypass graft: Secondary | ICD-10-CM | POA: Diagnosis not present

## 2016-06-24 DIAGNOSIS — Z955 Presence of coronary angioplasty implant and graft: Secondary | ICD-10-CM | POA: Diagnosis not present

## 2016-06-24 DIAGNOSIS — I081 Rheumatic disorders of both mitral and tricuspid valves: Secondary | ICD-10-CM | POA: Diagnosis present

## 2016-06-24 DIAGNOSIS — E119 Type 2 diabetes mellitus without complications: Secondary | ICD-10-CM | POA: Diagnosis present

## 2016-06-24 DIAGNOSIS — I252 Old myocardial infarction: Secondary | ICD-10-CM | POA: Diagnosis not present

## 2016-06-24 DIAGNOSIS — I5023 Acute on chronic systolic (congestive) heart failure: Secondary | ICD-10-CM | POA: Diagnosis present

## 2016-06-24 DIAGNOSIS — D649 Anemia, unspecified: Secondary | ICD-10-CM | POA: Diagnosis present

## 2016-07-07 ENCOUNTER — Other Ambulatory Visit: Payer: Self-pay | Admitting: Internal Medicine

## 2016-07-07 DIAGNOSIS — Z1231 Encounter for screening mammogram for malignant neoplasm of breast: Secondary | ICD-10-CM

## 2016-07-08 ENCOUNTER — Inpatient Hospital Stay: Payer: Medicare Other | Admitting: Family Medicine

## 2016-07-09 ENCOUNTER — Ambulatory Visit (INDEPENDENT_AMBULATORY_CARE_PROVIDER_SITE_OTHER): Payer: Medicare Other | Admitting: Family Medicine

## 2016-07-09 ENCOUNTER — Encounter: Payer: Self-pay | Admitting: Family Medicine

## 2016-07-09 VITALS — BP 80/50 | HR 87 | Wt 124.6 lb

## 2016-07-09 DIAGNOSIS — I2510 Atherosclerotic heart disease of native coronary artery without angina pectoris: Secondary | ICD-10-CM | POA: Diagnosis not present

## 2016-07-09 DIAGNOSIS — E119 Type 2 diabetes mellitus without complications: Secondary | ICD-10-CM

## 2016-07-09 DIAGNOSIS — I5022 Chronic systolic (congestive) heart failure: Secondary | ICD-10-CM | POA: Diagnosis not present

## 2016-07-09 DIAGNOSIS — I10 Essential (primary) hypertension: Secondary | ICD-10-CM

## 2016-07-09 DIAGNOSIS — I252 Old myocardial infarction: Secondary | ICD-10-CM | POA: Diagnosis not present

## 2016-07-09 DIAGNOSIS — I255 Ischemic cardiomyopathy: Secondary | ICD-10-CM | POA: Diagnosis not present

## 2016-07-09 DIAGNOSIS — Z09 Encounter for follow-up examination after completed treatment for conditions other than malignant neoplasm: Secondary | ICD-10-CM

## 2016-07-09 DIAGNOSIS — Z006 Encounter for examination for normal comparison and control in clinical research program: Secondary | ICD-10-CM | POA: Diagnosis not present

## 2016-07-09 LAB — POCT GLYCOSYLATED HEMOGLOBIN (HGB A1C): HEMOGLOBIN A1C: 7.2

## 2016-07-09 NOTE — Progress Notes (Signed)
Subjective:    Patient ID: Rachel Oneill, female    DOB: July 21, 1951, 65 y.o.   MRN: [id number]  HPI Chief Complaint  Patient presents with  . Follow-up    hospital heart cath with stents   She states she is here for a follow up due to being discharged from a hospital in [place] on November 17th. She does not having any records with her and there are no care everywhere notes. She states she was in [facility] in [place].   States she went to [facility] on November 11 for a wedding and on November 12th she states her feet and legs were swollen severely and she went to the hospital. She states she was in heart failure, had a ? Heart attack and they did a cardiac cath and put in stents, she does not know how many.   States Brilinta 90 mg bid was started when she was in the hospital . States they stopped her Lasix and added Torsemide 20 mg daily. States she has severe DOE approximately 1 hour after taking Brilinta and it lasts 2-3 minutes and then she is fine. She took it today and had the same reaction.   Diabetes - has not followed up for a diabetes check since July 2017. She has no explanation for not following up.  States she occasionally checks fasting blood sugars and they have been around 113, 119.   She states she has an appointment with Dr. Jacinto Halim, her cardiologist after leaving here today. She plans to have him help her with medication adjustments. She states she thinks his office will have her records from [place].   She denies fever, chills, headache, dizziness, chest pain, palpitations, shortness of breath, abdominal pain, N/V/D, LE edema, urinary symptoms.   Past Medical History:  Diagnosis Date  . Bronchitis   . CHF (congestive heart failure) (HCC)   . Hypertension   . Uncontrolled diabetes mellitus with complications (HCC)        Review of Systems Pertinent positives and negatives in the history of present illness.     Objective:   Physical Exam  Constitutional:  She is oriented to person, place, and time. She appears well-developed and well-nourished. No distress.  Neck: Normal range of motion. Neck supple.  Cardiovascular: Normal rate, regular rhythm and intact distal pulses.   No LE edema  Pulmonary/Chest: Effort normal and breath sounds normal.  Neurological: She is alert and oriented to person, place, and time.  Skin: Skin is warm, dry and intact. No pallor.  Psychiatric: She has a normal mood and affect. Her behavior is normal. Judgment and thought content normal.   BP (!) 80/50   Pulse 87   Wt 124 lb 9.6 oz (56.5 kg)   SpO2 99%   BMI 21.39 kg/m    A1c 7.2% improved from 7.7% in July 2017     Assessment & Plan:  Hospital discharge follow-up  Type 2 diabetes mellitus without complication, without long-term current use of insulin (HCC) - Plan: HgB A1c  Chronic systolic heart failure (HCC)  Essential hypertension  Discussed that I do not have her discharge summary from her hospital stay in [place] and I will defer her cardiac medication regimen to Dr. Jacinto Halim. She is leaving here and going to see him. She is aware that her BP is lower than I would like but she is asymptomatic. No evidence of her being in acute heart failure today.  Front office is attempting  to get records from [facility] in [place].  Will get these records and have patient take a copy of these with her to Dr. Jacinto HalimGanji office.  Discussed that her hemoglobin A1C has improved and is closer to goal range at 7.2%. She will continue on current medication and watch her diet.  Plan to have her follow up for diabetes in 3 months. Discussed the importance of following up with her providers since she has a history of not following up as recommended.

## 2016-08-05 ENCOUNTER — Telehealth: Payer: Self-pay

## 2016-08-05 NOTE — Telephone Encounter (Signed)
Pt needs refill of torsemide to [facility]

## 2016-08-05 NOTE — Telephone Encounter (Signed)
She is coming in tomorrow but can you please call her and let her know that I would like to have her cardiologist refill this for her. I have not prescribed this for her in the past. Thanks.

## 2016-08-05 NOTE — Telephone Encounter (Signed)
Is this okay to refill? Not sure you have filled this before

## 2016-08-06 ENCOUNTER — Ambulatory Visit (INDEPENDENT_AMBULATORY_CARE_PROVIDER_SITE_OTHER): Payer: Medicare Other | Admitting: Family Medicine

## 2016-08-06 ENCOUNTER — Encounter: Payer: Self-pay | Admitting: Family Medicine

## 2016-08-06 VITALS — BP 130/68 | HR 72 | Resp 16 | Ht 66.0 in | Wt 127.8 lb

## 2016-08-06 DIAGNOSIS — Z1211 Encounter for screening for malignant neoplasm of colon: Secondary | ICD-10-CM

## 2016-08-06 DIAGNOSIS — Z0000 Encounter for general adult medical examination without abnormal findings: Secondary | ICD-10-CM | POA: Diagnosis not present

## 2016-08-06 DIAGNOSIS — Z23 Encounter for immunization: Secondary | ICD-10-CM

## 2016-08-06 DIAGNOSIS — I420 Dilated cardiomyopathy: Secondary | ICD-10-CM | POA: Diagnosis not present

## 2016-08-06 DIAGNOSIS — I255 Ischemic cardiomyopathy: Secondary | ICD-10-CM | POA: Diagnosis not present

## 2016-08-06 DIAGNOSIS — E559 Vitamin D deficiency, unspecified: Secondary | ICD-10-CM | POA: Diagnosis not present

## 2016-08-06 DIAGNOSIS — E782 Mixed hyperlipidemia: Secondary | ICD-10-CM

## 2016-08-06 DIAGNOSIS — I2510 Atherosclerotic heart disease of native coronary artery without angina pectoris: Secondary | ICD-10-CM | POA: Diagnosis not present

## 2016-08-06 DIAGNOSIS — E1121 Type 2 diabetes mellitus with diabetic nephropathy: Secondary | ICD-10-CM | POA: Diagnosis not present

## 2016-08-06 DIAGNOSIS — E2839 Other primary ovarian failure: Secondary | ICD-10-CM

## 2016-08-06 DIAGNOSIS — Z1159 Encounter for screening for other viral diseases: Secondary | ICD-10-CM

## 2016-08-06 DIAGNOSIS — E119 Type 2 diabetes mellitus without complications: Secondary | ICD-10-CM | POA: Diagnosis not present

## 2016-08-06 DIAGNOSIS — I10 Essential (primary) hypertension: Secondary | ICD-10-CM

## 2016-08-06 DIAGNOSIS — Z006 Encounter for examination for normal comparison and control in clinical research program: Secondary | ICD-10-CM | POA: Diagnosis not present

## 2016-08-06 LAB — COMPLETE METABOLIC PANEL WITH GFR
ALBUMIN: 4.2 g/dL (ref 3.6–5.1)
ALK PHOS: 88 U/L (ref 33–130)
ALT: 32 U/L — AB (ref 6–29)
AST: 39 U/L — AB (ref 10–35)
BUN: 19 mg/dL (ref 7–25)
CALCIUM: 9.7 mg/dL (ref 8.6–10.4)
CO2: 24 mmol/L (ref 20–31)
CREATININE: 1.2 mg/dL — AB (ref 0.50–0.99)
Chloride: 106 mmol/L (ref 98–110)
GFR, Est African American: 54 mL/min — ABNORMAL LOW (ref >=60)
GFR, Est Non African American: 47 mL/min — ABNORMAL LOW (ref >=60)
Glucose, Bld: 116 mg/dL — ABNORMAL HIGH (ref 65–99)
Potassium: 4.2 mmol/L (ref 3.5–5.3)
Sodium: 141 mmol/L (ref 135–146)
Total Bilirubin: 0.7 mg/dL (ref 0.2–1.2)
Total Protein: 7.4 g/dL (ref 6.1–8.1)

## 2016-08-06 LAB — CBC WITH DIFFERENTIAL/PLATELET
BASOS PCT: 0 %
Basophils Absolute: 0 cells/uL (ref 0–200)
Eosinophils Absolute: 74 cells/uL (ref 15–500)
Eosinophils Relative: 1 %
HEMATOCRIT: 37 % (ref 35.0–45.0)
HEMOGLOBIN: 11.7 g/dL (ref 11.7–15.5)
LYMPHS ABS: 1406 {cells}/uL (ref 850–3900)
Lymphocytes Relative: 19 %
MCH: 31.5 pg (ref 27.0–33.0)
MCHC: 31.6 g/dL — ABNORMAL LOW (ref 32.0–36.0)
MCV: 99.5 fL (ref 80.0–100.0)
MONO ABS: 666 {cells}/uL (ref 200–950)
MPV: 10 fL (ref 7.5–12.5)
Monocytes Relative: 9 %
NEUTROS PCT: 71 %
Neutro Abs: 5254 cells/uL (ref 1500–7800)
Platelets: 234 10*3/uL (ref 140–400)
RBC: 3.72 MIL/uL — AB (ref 3.80–5.10)
RDW: 15.6 % — ABNORMAL HIGH (ref 11.0–15.0)
WBC: 7.4 10*3/uL (ref 4.0–10.5)

## 2016-08-06 LAB — LIPID PANEL
CHOL/HDL RATIO: 3.5 ratio (ref <5.0)
CHOLESTEROL: 140 mg/dL (ref <200)
HDL: 40 mg/dL — ABNORMAL LOW (ref >50)
LDL CALC: 83 mg/dL (ref <100)
Triglycerides: 85 mg/dL (ref <150)
VLDL: 17 mg/dL (ref <30)

## 2016-08-06 LAB — TSH: TSH: 3.38 mIU/L

## 2016-08-06 NOTE — Patient Instructions (Addendum)
You received your Prevnar 13 (pneumonia vaccine today).  I am giving you a prescription for your Tdap injection. You can take this to the pharmacy and get this.  When you return in March we will discuss the shingles vaccine.   Your mammogram is on January 4th 2018.  Call and schedule a diabetic eye exam.  We will call you with lab results.   The GI office will call you to schedule an office visit to discuss screening colonoscopy.   I will also mail you a more complete after visit summary in the next couple of days.   Return in early March for a diabetes follow up. Continue checking you daily blood sugars (fasting) and keep a record. Bring in your readings to your diabetes appointment in March please.

## 2016-08-06 NOTE — Progress Notes (Signed)
Rachel Oneill is a 65 y.o. female who presents for annual wellness visit and follow-up on chronic medical conditions.  She has the following concerns: No concerns. She has an appointment with her cardiologist later this afternoon. States she has been feeling well.  History of diabetes type 2. She checks her blood sugars fairly regularly and reports fasting readings less than 130. Her last A1c was in November and 7.2% reports good medication compliance. Is taking a daily statin and will need to check fasting lipids. Hypertension - does not check her blood pressure at home. Reports good medication compliance. Denies ever having a colonoscopy. Denies having a bone density study in the past. History of vitamin D deficiency and is not currently taking vitamin D.    Immunization History  Administered Date(s) Administered  . Influenza-Unspecified 05/11/2015, 05/10/2016  . Pneumococcal Conjugate-13 08/06/2016   Last Pap smear: 2 years ago. Denies history of abnormal pap smear.  Last mammogram: 08/13/16 Last colonoscopy: never Last DEXA: never that she can recall. Stopped having menstrual cycles at age 53.  Dentist: never - has dentures Ophtho: year and half ago. She is due for diabetic eye exam. Exercise: none in the past couple of months.   Immunizations: cannot recall getting pneumonia vaccine. States Tetanus shot was at least approximately 5 years ago.   Other doctors caring for patient include: Dr. Jacinto Halim- cardiologist. Has an appointment later today.    Depression screen:  See questionnaire below.  Depression screen PHQ 2/9 08/06/2016  Decreased Interest 0  Down, Depressed, Hopeless 0  PHQ - 2 Score 0    Fall Risk Screen: see questionnaire below. Fall Risk  08/06/2016  Falls in the past year? No    ADL screen:  See questionnaire below Functional Status Survey: Is the patient deaf or have difficulty hearing?: No Does the patient have difficulty seeing, even when wearing  glasses/contacts?: No Does the patient have difficulty concentrating, remembering, or making decisions?: No Does the patient have difficulty walking or climbing stairs?: No Does the patient have difficulty dressing or bathing?: No Does the patient have difficulty doing errands alone such as visiting a doctor's office or shopping?: No   End of Life Discussion:  Patient does not have a living will and medical power of attorney. Paperwork given and discussed.   Review of Systems Constitutional: -fever, -chills, -sweats, -unexpected weight change, -anorexia, -fatigue Allergy: -sneezing, -itching, -congestion Dermatology: denies changing moles, rash, lumps, new worrisome lesions ENT: -runny nose, -ear pain, -sore throat, -hoarseness, -sinus pain, -teeth pain (dentures), -tinnitus, -hearing loss Cardiology:  -chest pain, -palpitations, -edema, -orthopnea, -paroxysmal nocturnal dyspnea Respiratory: -cough, -shortness of breath, -dyspnea on exertion, -wheezing, -hemoptysis Gastroenterology: -abdominal pain, -nausea, -vomiting, -diarrhea, -constipation, -blood in stool, -changes in bowel movement, -dysphagia Hematology: -bleeding or bruising problems Musculoskeletal: -arthralgias, -myalgias, -joint swelling, -back pain, -neck pain, -cramping, -gait changes Ophthalmology: -vision changes, -eye redness, -itching, -discharge Urology: -dysuria, -difficulty urinating, -hematuria, -urinary frequency, -urgency, incontinence Neurology: -headache, -weakness, -tingling, -numbness, -speech abnormality, -memory loss, -falls, -dizziness Psychology:  -depressed mood, -agitation, -sleep problems    PHYSICAL EXAM:  BP 130/68   Pulse 72   Resp 16   Ht 5\' 6"  (1.676 m)   Wt 127 lb 12.8 oz (58 kg)   SpO2 97%   BMI 20.63 kg/m   General Appearance: Alert, cooperative, no distress, appears stated age Head: Normocephalic, without obvious abnormality, atraumatic Eyes: PERRL, conjunctiva/corneas clear, EOM's  intact, fundi benign Ears: Normal TM's and external ear canals Nose: Nares  normal, mucosa normal, no drainage or sinus   tenderness Throat: Lips, mucosa, and tongue normal; teeth and gums normal Neck: Supple, no lymphadenopathy; thyroid: no enlargement/tenderness/nodules; no carotid bruit or JVD Back: Spine nontender, no curvature, ROM normal, no CVA tenderness Lungs: Clear to auscultation bilaterally without wheezes, rales or ronchi; respirations unlabored Chest Wall: No tenderness or deformity Heart: Regular rate and rhythm, S1 and S2 normal, no murmur, rub or gallop Breast Exam: No tenderness, masses, or nipple discharge or inversion. No axillary lymphadenopathy Abdomen: Soft, non-tender, nondistended, normoactive bowel sounds, no masses, no hepatosplenomegaly Genitalia: Normal external genitalia without lesions.  BUS and vagina normal; cervix without lesions, or cervical motion tenderness. No abnormal vaginal discharge.  Uterus and adnexa not enlarged, nontender, no masses.  Pap performed Rectal: Normal tone, no masses or tenderness; guaiac negative stool Extremities: No clubbing, cyanosis or edema Pulses: 2+ and symmetric all extremities Skin: Skin color, texture, turgor normal, no rashes or lesions Lymph nodes: Cervical, supraclavicular, and axillary nodes normal Neurologic: CNII-XII intact, normal strength, sensation and gait; reflexes 2+ and symmetric throughout Psych: Normal mood, affect, hygiene and grooming.  ASSESSMENT/PLAN: Medicare annual wellness visit, subsequent  Essential hypertension - Plan: CBC with Differential/Platelet, COMPLETE METABOLIC PANEL WITH GFR  Type 2 diabetes with nephropathy (HCC) - Plan: Lipid panel, Microalbumin / creatinine urine ratio, TSH, CBC with Differential/Platelet, COMPLETE METABOLIC PANEL WITH GFR  Estrogen deficiency - Plan: VITAMIN D 25 Hydroxy (Vit-D Deficiency, Fractures), DG Bone Density  Special screening for malignant neoplasms, colon  - Plan: Ambulatory referral to Gastroenterology  Need for hepatitis C screening test - Plan: Hepatitis C antibody  Mixed hyperlipidemia - Plan: Lipid panel  Need for pneumococcal vaccination - Plan: Pneumococcal conjugate vaccine 13-valent  Vitamin D deficiency  She appears to be doing well today. As far as I can tell she has no issues with self-care and she is working and enjoying life. Discussed healthy lifestyle including healthy diet and getting at least 150 minutes of physical activity per week. She is aware that she needs to keep an eye on her weight since she has a history of CHF. Low-sodium diet discussed and she appears to be doing well with this.  Blood pressure appears well controlled on current medication regimen. Per medical records she has previously been on an ARB but I'm not sure why she was taken off of it. She is not currently on an ACE inhibitor or arm and plan to discuss this with cardiologist. She would benefit from this class due to diabetic nephropathy.  Mammogram and bone density test scheduled for the same day in early November.  Discussed monthly self breast exams and yearly mammograms; at least 30 minutes of aerobic activity at least 5 days/week and weight-bearing exercise 2x/week; proper sunscreen use reviewed; healthy diet, including goals of calcium and vitamin D intake and alcohol recommendations (less than or equal to 1 drink/day) reviewed; regular seatbelt use; changing batteries in smoke detectors.  Immunization recommendations discussed.  Colonoscopy recommendations reviewed and referral made.    Medicare Attestation I have personally reviewed: The patient's medical and social history Their use of alcohol, tobacco or illicit drugs Their current medications and supplements The patient's functional ability including ADLs,fall risks, home safety risks, cognitive, and hearing and visual impairment Diet and physical activities Evidence for depression or mood  disorders  The patient's weight, height, and BMI have been recorded in the chart.  I have made referrals, counseling, and provided education to the patient based on review of  the above and I have provided the patient with a written personalized care plan for preventive services.     Hetty Blend, NP   08/07/2016

## 2016-08-07 ENCOUNTER — Encounter: Payer: Self-pay | Admitting: Family Medicine

## 2016-08-07 ENCOUNTER — Other Ambulatory Visit: Payer: Self-pay | Admitting: Family Medicine

## 2016-08-07 DIAGNOSIS — N183 Chronic kidney disease, stage 3 unspecified: Secondary | ICD-10-CM | POA: Insufficient documentation

## 2016-08-07 DIAGNOSIS — E559 Vitamin D deficiency, unspecified: Secondary | ICD-10-CM | POA: Insufficient documentation

## 2016-08-07 LAB — VITAMIN D 25 HYDROXY (VIT D DEFICIENCY, FRACTURES): Vit D, 25-Hydroxy: 18 ng/mL — ABNORMAL LOW (ref 30–100)

## 2016-08-07 LAB — MICROALBUMIN / CREATININE URINE RATIO
CREATININE, URINE: 195 mg/dL (ref 20–320)
Microalb Creat Ratio: 141 mcg/mg creat — ABNORMAL HIGH (ref <30)
Microalb, Ur: 27.5 mg/dL

## 2016-08-07 LAB — HEPATITIS C ANTIBODY: HCV AB: NEGATIVE

## 2016-08-07 MED ORDER — VITAMIN D (ERGOCALCIFEROL) 1.25 MG (50000 UNIT) PO CAPS: 50000.0000 [IU] | ORAL_CAPSULE | ORAL | 0 refills | Status: DC

## 2016-08-12 ENCOUNTER — Encounter: Payer: Self-pay | Admitting: Internal Medicine

## 2016-08-13 ENCOUNTER — Ambulatory Visit
Admission: RE | Admit: 2016-08-13 | Discharge: 2016-08-13 | Disposition: A | Discharge status: Home / Self Care | Payer: Medicare Other | Source: Ambulatory Visit | Attending: Internal Medicine | Admitting: Internal Medicine

## 2016-08-13 ENCOUNTER — Ambulatory Visit
Admission: RE | Admit: 2016-08-13 | Discharge: 2016-08-13 | Disposition: A | Discharge status: Home / Self Care | Payer: Medicare Other | Source: Ambulatory Visit | Attending: Family Medicine | Admitting: Family Medicine

## 2016-08-13 DIAGNOSIS — Z1231 Encounter for screening mammogram for malignant neoplasm of breast: Secondary | ICD-10-CM

## 2016-08-13 DIAGNOSIS — Z13820 Encounter for screening for osteoporosis: Secondary | ICD-10-CM | POA: Diagnosis not present

## 2016-08-13 DIAGNOSIS — Z780 Asymptomatic menopausal state: Secondary | ICD-10-CM | POA: Diagnosis not present

## 2016-08-13 DIAGNOSIS — E2839 Other primary ovarian failure: Secondary | ICD-10-CM

## 2016-08-14 ENCOUNTER — Other Ambulatory Visit: Payer: Self-pay | Admitting: Internal Medicine

## 2016-08-14 DIAGNOSIS — R928 Other abnormal and inconclusive findings on diagnostic imaging of breast: Secondary | ICD-10-CM

## 2016-08-19 ENCOUNTER — Encounter: Payer: Self-pay | Admitting: Physician Assistant

## 2016-08-24 ENCOUNTER — Other Ambulatory Visit: Payer: Medicare Other

## 2016-08-25 ENCOUNTER — Encounter: Payer: Self-pay | Admitting: Family Medicine

## 2016-08-28 ENCOUNTER — Telehealth: Payer: Self-pay | Admitting: Emergency Medicine

## 2016-08-28 ENCOUNTER — Ambulatory Visit (INDEPENDENT_AMBULATORY_CARE_PROVIDER_SITE_OTHER): Payer: Medicare Other | Admitting: Physician Assistant

## 2016-08-28 ENCOUNTER — Encounter: Payer: Self-pay | Admitting: Physician Assistant

## 2016-08-28 VITALS — BP 118/42 | HR 86 | Ht 64.0 in | Wt 130.0 lb

## 2016-08-28 DIAGNOSIS — Z1212 Encounter for screening for malignant neoplasm of rectum: Secondary | ICD-10-CM

## 2016-08-28 DIAGNOSIS — Z1211 Encounter for screening for malignant neoplasm of colon: Secondary | ICD-10-CM

## 2016-08-28 DIAGNOSIS — Z7901 Long term (current) use of anticoagulants: Secondary | ICD-10-CM | POA: Diagnosis not present

## 2016-08-28 DIAGNOSIS — Z01818 Encounter for other preprocedural examination: Secondary | ICD-10-CM | POA: Diagnosis not present

## 2016-08-28 NOTE — Progress Notes (Signed)
Chief Complaint: Consult for screening colo in patient on chronic anticoagulation  HPI:  Rachel Oneill is a 66 year old African-American female with a past medical history of CHF, most recent LVEF 30-35% on 06/24/16 when patient had a stent placed in [place], who was referred to me by Avanell Shackleton, NP for consult of a screening colonoscopy.   Today, patient tells me that she is maintained on Plavix, which she has been on for years. She recently had a stent placed while in [place] for a wedding in November of this past year. She follows with Dr.Ganji here in town who does prescribe her anticoagulant. Patient has felt well since time of stent placement and is "ready to get a colonoscopy done".   Patient denies fever, chills, blood in her stool, melena, weight loss, nausea, vomiting, heartburn, reflux or abdominal pain.  Past Medical History:  Diagnosis Date  . Bronchitis   . CHF (congestive heart failure) (HCC)   . Hypertension   . Uncontrolled diabetes mellitus with complications [facility])     Past Surgical History:  Procedure Laterality Date  . CARDIAC SURGERY     double bypass 2007    Current Outpatient Prescriptions  Medication Sig Dispense Refill  . albuterol (PROVENTIL HFA;VENTOLIN HFA) 108 (90 Base) MCG/ACT inhaler Inhale 1 puff into the lungs every 6 (six) hours as needed for wheezing or shortness of breath.    Marland Kitchen aspirin 81 MG chewable tablet Chew 1 tablet (81 mg total) by mouth daily. 60 tablet 2  . atorvastatin (LIPITOR) 80 MG tablet Take 1 tablet (80 mg total) by mouth daily. 90 tablet 2  . ENTRESTO 97-103 MG Take 1 tablet by mouth 2 (two) times daily.     . indomethacin (INDOCIN) 25 MG capsule Take 25 mg by mouth 3 (three) times daily as needed.    . metFORMIN (GLUCOPHAGE) 500 MG tablet Take 1 tablet (500 mg total) by mouth 2 (two) times daily with a meal. Reported on 02/13/2016 60 tablet 5  . metoprolol succinate (TOPROL-XL) 50 MG 24 hr tablet Take 50 mg by mouth daily.  Take with or immediately following a meal.    . potassium chloride SA (K-DUR,KLOR-CON) 20 MEQ tablet Take 1 tablet (20 mEq total) when you take Lasix 30 tablet 2  . prasugrel (EFFIENT) 10 MG TABS tablet Take 10 mg by mouth daily.    Marland Kitchen spironolactone (ALDACTONE) 25 MG tablet Take 25 mg by mouth daily.     Marland Kitchen torsemide (DEMADEX) 20 MG tablet Take 20 mg by mouth daily.    . Vitamin D, Ergocalciferol, (DRISDOL) 50000 units CAPS capsule Take 1 capsule (50,000 Units total) by mouth every 7 (seven) days. 8 capsule 0   No current facility-administered medications for this visit.     Allergies as of 08/28/2016 - Review Complete 08/28/2016  Allergen Reaction Noted  . Shellfish allergy Itching 09/01/2012    Family History  Problem Relation Age of Onset  . Diabetes Mother   . Colon cancer Neg Hx   . Stomach cancer Neg Hx     Social History   Social History  . Marital status: Legally Separated    Spouse name: N/A  . Number of children: N/A  . Years of education: N/A   Occupational History  . food service [employer]   Social History Main Topics  . Smoking status: Former Smoker    Types: Cigarettes    Quit date: 05/31/2006  . Smokeless tobacco: Never Used  .  Alcohol use No  . Drug use: No  . Sexual activity: Not on file   Other Topics Concern  . Not on file   Social History Narrative  . No narrative on file    Review of Systems:    Constitutional: No weight loss, fever, chills, weakness or fatigue HEENT: Eyes: No change in vision               Ears, Nose, Throat:  No change in hearing or congestion Skin: No rash or itching Cardiovascular: No chest pain, chest pressure or palpitations   Respiratory: No SOB or cough Gastrointestinal: See HPI and otherwise negative Genitourinary: No dysuria or change in urinary frequency Neurological: No headache, dizziness or syncope Musculoskeletal: No new muscle or joint pain Hematologic: No bleeding or bruising Psychiatric:  No history of depression or anxiety   Physical Exam:  Vital signs: BP (!) 118/42   Pulse 86   Ht 5\' 4"  (1.626 m)   Wt 130 lb (59 kg)   BMI 22.31 kg/m   Constitutional:   Pleasant Thin appearing African-American female appears to be in NAD, Well developed, Well nourished, alert and cooperative Head:  Normocephalic and atraumatic. Eyes:   PEERL, EOMI. No icterus. Conjunctiva pink. Ears:  Normal auditory acuity. Neck:  Supple Throat: Oral cavity and pharynx without inflammation, swelling or lesion.  Respiratory: Respirations even and unlabored. Lungs clear to auscultation bilaterally.   No wheezes, crackles, or rhonchi.  Cardiovascular: Normal S1, S2. No MRG. Regular rate and rhythm. No peripheral edema, cyanosis or pallor.  Gastrointestinal:  Soft, nondistended, nontender. No rebound or guarding. Normal bowel sounds. No appreciable masses or hepatomegaly. Rectal:  Not performed.  Msk:  Symmetrical without gross deformities. Without edema, no deformity or joint abnormality.  Neurologic:  Alert and  oriented x4;  grossly normal neurologically.  Skin:   Dry and intact without significant lesions or rashes. Psychiatric: Demonstrates good judgement and reason without abnormal affect or behaviors.  RELEVANT LABS AND IMAGING: CBC    Component Value Date/Time   WBC 7.4 08/06/2016 0930   RBC 3.72 (L) 08/06/2016 0930   HGB 11.7 08/06/2016 0930   HCT 37.0 08/06/2016 0930   PLT 234 08/06/2016 0930   MCV 99.5 08/06/2016 0930   MCH 31.5 08/06/2016 0930   MCHC 31.6 (L) 08/06/2016 0930   RDW 15.6 (H) 08/06/2016 0930   LYMPHSABS 1,406 08/06/2016 0930   MONOABS 666 08/06/2016 0930   EOSABS 74 08/06/2016 0930   BASOSABS 0 08/06/2016 0930    CMP     Component Value Date/Time   NA 141 08/06/2016 0930   K 4.2 08/06/2016 0930   CL 106 08/06/2016 0930   CO2 24 08/06/2016 0930   GLUCOSE 116 (H) 08/06/2016 0930   BUN 19 08/06/2016 0930   CREATININE 1.20 (H) 08/06/2016 0930   CALCIUM 9.7  08/06/2016 0930   PROT 7.4 08/06/2016 0930   ALBUMIN 4.2 08/06/2016 0930   AST 39 (H) 08/06/2016 0930   ALT 32 (H) 08/06/2016 0930   ALKPHOS 88 08/06/2016 0930   BILITOT 0.7 08/06/2016 0930   GFRNONAA 47 (L) 08/06/2016 0930   GFRAA 54 (L) 08/06/2016 0930    Assessment: 1. Screening for colorectal cancer: Patient has never had a screening colonoscopy and is overdue at the age of 60 2. Chronic anticoagulation: Patient with recent stent placement in November 2017, on Plavix  Plan: 1. Discussed case with Dr. Christella Hartigan of the time patient's appointment. Typically we wait at least 6-12  months after stent placement for colonoscopy. Will defer this question to patient's cardiologist Dr. Jacinto Halim, and proceed when he says would be best for her heart as this is only a screening procedure. 2. Patient was advised that she would have to hold her Plavix for 5 days prior to the procedure. We will also discuss this with her cardiologist. 3. Discussed a colonoscopy, this would need to be scheduled in the hospital due to patient's LVEF of less than 35% recently in November. Discussed risks, benefits, limitations and alternatives and the patient agrees to proceed whenever we can schedule this. 4. Likely patient will need to be brought back to the clinic if it is 6-12 months from now that is deemed safe for this procedure, for office visit to schedule procedure in the hospital  Hyacinth Meeker, PA-C [facility] 08/28/2016, 2:26 PM  Cc: Avanell Shackleton, NP

## 2016-08-28 NOTE — Telephone Encounter (Signed)
Dr. Jacinto Halim,   please see last GI office note and advise if patient is currently stable enough to come off Plavix for a colonoscopy. She is not currently having any complications, this is only for screening purposes and she is ok to wait if necessary. Please route answer back to me.   Sincerely,   Deneise Lever, CMA

## 2016-08-28 NOTE — Progress Notes (Signed)
I agree with the above note, plan 

## 2016-08-28 NOTE — Patient Instructions (Signed)
We will call you once we get cardiac clearance from your doctor.

## 2016-08-31 ENCOUNTER — Ambulatory Visit
Admission: RE | Admit: 2016-08-31 | Discharge: 2016-08-31 | Disposition: A | Discharge status: Home / Self Care | Payer: Medicare Other | Source: Ambulatory Visit | Attending: Internal Medicine | Admitting: Internal Medicine

## 2016-08-31 DIAGNOSIS — R928 Other abnormal and inconclusive findings on diagnostic imaging of breast: Secondary | ICD-10-CM

## 2016-08-31 DIAGNOSIS — R922 Inconclusive mammogram: Secondary | ICD-10-CM | POA: Diagnosis not present

## 2016-08-31 NOTE — Telephone Encounter (Signed)
Lets bring her back to the office in 05/2017 to discuss screening examination at that point.  Thanks

## 2016-08-31 NOTE — Telephone Encounter (Signed)
I will set up a reminder to schedule patient in 05/2017.

## 2016-08-31 NOTE — Telephone Encounter (Signed)
-----   Message from Yates Decamp, MD sent at 08/29/2016  8:28 AM EST ----- Regarding: Plavix She had coronary stents placed sometime in Oct 2017 and hence cannot stop until end of this year. Yates Decamp

## 2016-08-31 NOTE — Telephone Encounter (Signed)
Noted  

## 2016-09-15 ENCOUNTER — Telehealth: Payer: Self-pay | Admitting: Family Medicine

## 2016-09-15 NOTE — Telephone Encounter (Signed)
Pt called and states that the albuterol is not working anymore and would like something stronger, pt is still having shortness of breath, and its not doing anything for her, pt can be reached at [phone] states she is at work and you can leave her a message on her cell phone, [facility] [address] [place] - [address]

## 2016-09-15 NOTE — Telephone Encounter (Signed)
I recommend that she come in for a visit.

## 2016-09-15 NOTE — Telephone Encounter (Signed)
I am concerned that her worsening shortness of breath may be related to her heart failure and not related to asthma or albuterol. Please get more information from her. Any weight gain? Swelling in her feet or legs? Cough? Chest pain?  She will need to be seen either here or at her cardiologist office if she is getting worse.

## 2016-09-15 NOTE — Telephone Encounter (Signed)
Left message for pt to call me back 
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
## 2016-09-16 NOTE — Telephone Encounter (Signed)
Called and left message for pt to call back and schedule an appt

## 2016-09-21 ENCOUNTER — Encounter: Payer: Self-pay | Admitting: Family Medicine

## 2016-09-21 ENCOUNTER — Ambulatory Visit (INDEPENDENT_AMBULATORY_CARE_PROVIDER_SITE_OTHER): Payer: Medicare Other | Admitting: Family Medicine

## 2016-09-21 VITALS — BP 118/64 | HR 60 | Temp 97.3°F | Resp 16 | Wt 133.2 lb

## 2016-09-21 DIAGNOSIS — R0609 Other forms of dyspnea: Secondary | ICD-10-CM | POA: Diagnosis not present
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
## 2016-09-21 NOTE — Patient Instructions (Addendum)
Check your weight daily and report back any daily weight gain > 2 lbs.   DASH Eating Plan DASH stands for "Dietary Approaches to Stop Hypertension." The DASH eating plan is a healthy eating plan that has been shown to reduce high blood pressure (hypertension). Additional health benefits may include reducing the risk of type 2 diabetes mellitus, heart disease, and stroke. The DASH eating plan may also help with weight loss. What do I need to know about the DASH eating plan? For the DASH eating plan, you will follow these general guidelines:  Choose foods with less than 150 milligrams of sodium per serving (as listed on the food label).  Use salt-free seasonings or herbs instead of table salt or sea salt.  Check with your health care provider or pharmacist before using salt substitutes.  Eat lower-sodium products. These are often labeled as "low-sodium" or "no salt added."  Eat fresh foods. Avoid eating a lot of canned foods.  Eat more vegetables, fruits, and low-fat dairy products.  Choose whole grains. Look for the word "whole" as the first word in the ingredient list.  Choose fish and skinless chicken or [place] more often than red meat. Limit fish, poultry, and meat to 6 oz (170 g) each day.  Limit sweets, desserts, sugars, and sugary drinks.  Choose heart-healthy fats.  Eat more home-cooked food and less restaurant, buffet, and fast food.  Limit fried foods.  Do not fry foods. Cook foods using methods such as baking, boiling, grilling, and broiling instead.  When eating at a restaurant, ask that your food be prepared with less salt, or no salt if possible. What foods can I eat? Seek help from a dietitian for individual calorie needs. Grains  Whole grain or whole wheat bread. Brown rice. Whole grain or whole wheat pasta. Quinoa, bulgur, and whole grain cereals. Low-sodium cereals. Corn or whole wheat flour tortillas. Whole grain cornbread. Whole grain crackers. Low-sodium  crackers. Vegetables  Fresh or frozen vegetables (raw, steamed, roasted, or grilled). Low-sodium or reduced-sodium tomato and vegetable juices. Low-sodium or reduced-sodium tomato sauce and paste. Low-sodium or reduced-sodium canned vegetables. Fruits  All fresh, canned (in natural juice), or frozen fruits. Meat and Other Protein Products  Ground beef (85% or leaner), grass-fed beef, or beef trimmed of fat. Skinless chicken or [place]. Ground chicken or [place]. Pork trimmed of fat. All fish and seafood. Eggs. Dried beans, peas, or lentils. Unsalted nuts and seeds. Unsalted canned beans. Dairy  Low-fat dairy products, such as skim or 1% milk, 2% or reduced-fat cheeses, low-fat ricotta or cottage cheese, or plain low-fat yogurt. Low-sodium or reduced-sodium cheeses. Fats and Oils  Tub margarines without trans fats. Light or reduced-fat mayonnaise and salad dressings (reduced sodium). Avocado. Safflower, olive, or canola oils. Natural peanut or almond butter. Other  Unsalted popcorn and pretzels. The items listed above may not be a complete list of recommended foods or beverages. Contact your dietitian for more options.  What foods are not recommended? Grains  White bread. White pasta. White rice. Refined cornbread. Bagels and croissants. Crackers that contain trans fat. Vegetables  Creamed or fried vegetables. Vegetables in a cheese sauce. Regular canned vegetables. Regular canned tomato sauce and paste. Regular tomato and vegetable juices. Fruits  Canned fruit in light or heavy syrup. Fruit juice. Meat and Other Protein Products  Fatty cuts of meat. Ribs, chicken wings, bacon, sausage, bologna, salami, chitterlings, fatback, hot dogs, bratwurst, and packaged luncheon meats. Salted nuts and seeds. Canned beans with salt. Dairy  Whole or 2% milk, cream, half-and-half, and cream cheese. Whole-fat or sweetened yogurt. Full-fat cheeses or blue cheese. Nondairy creamers and whipped toppings.  Processed cheese, cheese spreads, or cheese curds. Condiments  Onion and garlic salt, seasoned salt, table salt, and sea salt. Canned and packaged gravies. Worcestershire sauce. Tartar sauce. Barbecue sauce. Teriyaki sauce. Soy sauce, including reduced sodium. Steak sauce. Fish sauce. Oyster sauce. Cocktail sauce. Horseradish. Ketchup and mustard. Meat flavorings and tenderizers. Bouillon cubes. Hot sauce. Tabasco sauce. Marinades. Taco seasonings. Relishes. Fats and Oils  Butter, stick margarine, lard, shortening, ghee, and bacon fat. Coconut, palm kernel, or palm oils. Regular salad dressings. Other  Pickles and olives. Salted popcorn and pretzels. The items listed above may not be a complete list of foods and beverages to avoid. Contact your dietitian for more information.  Where can I find more information? National Heart, Lung, and Blood Institute: [employer] This information is not intended to replace advice given to you by your health care provider. Make sure you discuss any questions you have with your health care provider. Document Released: 07/16/2011 Document Revised: 01/02/2016 Document Reviewed: 05/31/2013 Elsevier Interactive Patient Education  2017 [employer].

## 2016-09-29 DIAGNOSIS — I5042 Chronic combined systolic (congestive) and diastolic (congestive) heart failure: Secondary | ICD-10-CM | POA: Diagnosis not present

## 2016-09-29 DIAGNOSIS — I2510 Atherosclerotic heart disease of native coronary artery without angina pectoris: Secondary | ICD-10-CM | POA: Diagnosis not present

## 2016-11-03 ENCOUNTER — Ambulatory Visit (INDEPENDENT_AMBULATORY_CARE_PROVIDER_SITE_OTHER): Payer: Medicare Other | Admitting: Family Medicine

## 2016-11-03 ENCOUNTER — Encounter: Payer: Self-pay | Admitting: Family Medicine

## 2016-11-03 VITALS — BP 110/70 | HR 60 | Wt 131.0 lb

## 2016-11-03 DIAGNOSIS — R945 Abnormal results of liver function studies: Secondary | ICD-10-CM

## 2016-11-03 DIAGNOSIS — I10 Essential (primary) hypertension: Secondary | ICD-10-CM | POA: Diagnosis not present

## 2016-11-03 DIAGNOSIS — E1121 Type 2 diabetes mellitus with diabetic nephropathy: Secondary | ICD-10-CM

## 2016-11-03 DIAGNOSIS — E559 Vitamin D deficiency, unspecified: Secondary | ICD-10-CM | POA: Diagnosis not present

## 2016-11-03 DIAGNOSIS — R7989 Other specified abnormal findings of blood chemistry: Secondary | ICD-10-CM

## 2016-11-03 LAB — COMPREHENSIVE METABOLIC PANEL
ALBUMIN: 3.8 g/dL (ref 3.6–5.1)
ALT: 20 U/L (ref 6–29)
AST: 19 U/L (ref 10–35)
Alkaline Phosphatase: 97 U/L (ref 33–130)
BUN: 24 mg/dL (ref 7–25)
CALCIUM: 9.1 mg/dL (ref 8.6–10.4)
CHLORIDE: 106 mmol/L (ref 98–110)
CO2: 21 mmol/L (ref 20–31)
Creat: 1.48 mg/dL — ABNORMAL HIGH (ref 0.50–0.99)
Glucose, Bld: 140 mg/dL — ABNORMAL HIGH (ref 65–99)
Potassium: 3.4 mmol/L — ABNORMAL LOW (ref 3.5–5.3)
Sodium: 140 mmol/L (ref 135–146)
TOTAL PROTEIN: 6.6 g/dL (ref 6.1–8.1)
Total Bilirubin: 1.1 mg/dL (ref 0.2–1.2)

## 2016-11-03 LAB — POCT GLYCOSYLATED HEMOGLOBIN (HGB A1C): Hemoglobin A1C: 7.4

## 2016-11-03 NOTE — Patient Instructions (Addendum)
Call and schedule a diabetic eye exam.   Here is a list of Eye Doctors that you call and schedule an appointment with.   [facility] Address: [address] [place], [place] [address] Phone: [phone]   [facility] Address: [address], [place], [place] [address] Phone: [phone]  Dr. Dione Booze Address: [address] [place], [place] [address] Phone: [phone]  [facility] [address] [place], [place]  [address] Telephone: [phone]  Start eating more protein and healthy vegetables. Cut back on "empty" carbohydrates and sugars. Keep an eye on your weight and limit salt intake.   Keep checking your blood sugars 2-3 times per week fasting and let me know if your readings are consistently >130 fasting.   Also, keep an eye on your blood pressure and the goal is <130/80. Let me know if you are seeing readings higher.   The key to keeping your kidneys healthy is to keep your blood sugar and blood pressure under good control.  We will call you with lab results.   Follow up with me for a FASTING (nothing to eat or drink except water for 6 hours) medcheck visit the end of July or early August.

## 2016-11-03 NOTE — Progress Notes (Signed)
Subjective:    Patient ID: Rachel Oneill, female    DOB: [birth year], 66 y.o.   MRN: [id number] Chief Complaint  Patient presents with  . diabetes check    diabetes check    Rachel Oneill is a 66 y.o. female who presents for follow-up of Type 2 diabetes mellitus.  She has not had her diabetic eye exam yet and would like a list of eye doctors.  States she would like to be able to gain some weight. Good appetite but diet consists of a lot of pasta, sugar and candy.  Has been taking vitamin D supplement and will need her vitamin D level checked.   States she had another echocardiogram last month and has appointment tomorrow with Dr. Jacinto Halim to get results.  History of CHF.LVEF 30-35% on 06/24/16  States she if feeling "great". No concerns or complaints today.   Denies fever, chills, dizziness, vision changes, chest pain, palpitations, shortness of breath, orthopnea, cough, abdominal pain, GI or GU symptoms, LE edema.   Appointment with Dr. Christella Hartigan, GI after she is able to stop Plavix which is close to the end of this year. She has to be off of Plavix for 5 days prior to colonoscopy.   Patient is checking home blood sugars.   Home blood sugar records: BGs range between 129 and 140, fasting  How often is blood sugars being checked: every other day Current symptoms include: none. Patient denies foot ulcerations, hypoglycemia , nausea, paresthesia of the feet, polydipsia, polyuria, visual disturbances, vomiting and weight loss.  Patient is checking their feet daily. Any Foot concerns (callous, ulcer, wound, thickened nails, toenail fungus, skin fungus, hammer toe): none Last dilated eye exam: upcoming appointment   Current treatments: continued Metformin which has been effective.  Medication compliance: good  Current diet: in general, an "unhealthy" diet Current exercise: walking at work and fairly active in her daily activities, otherwise nothing extra Known diabetic complications:  nephropathy and peripheral vascular disease  The following portions of the patient's history were reviewed and updated as appropriate: allergies, current medications, past medical history, past social history and problem list.  ROS as in subjective above.     Objective:    Physical Exam Alert and in no distress otherwise not examined.  Blood pressure 110/70, pulse 60, weight 131 lb (59.4 kg).  Lab Review Diabetic Labs Latest Ref Rng & Units 11/03/2016 08/06/2016 07/09/2016 05/31/2016 02/13/2016  HbA1c - 7.4% - 7.2 - 7.7  Microalbumin Not estab mg/dL - 40.9 - - -  Micro/Creat Ratio <30 mcg/mg creat - 141(H) - - -  Chol <200 mg/dL - 811 - - -  HDL >91 mg/dL - 47(W) - - -  Calc LDL <100 mg/dL - 83 - - -  Triglycerides <150 mg/dL - 85 - - -  Creatinine 0.50 - 0.99 mg/dL - 2.95(A) - 2.13(Y) 8.65(H)   BP/Weight 11/03/2016 09/21/2016 08/28/2016 08/06/2016 07/09/2016  Systolic BP 110 118 118 130 80  Diastolic BP 70 64 42 68 50  Wt. (Lbs) 131 133.2 130 127.8 124.6  BMI 22.49 22.86 22.31 20.63 21.39   No flowsheet data found.  Avari  reports that she quit smoking about 10 years ago. Her smoking use included Cigarettes. She has never used smokeless tobacco. She reports that she does not drink alcohol or use drugs.     Assessment & Plan:    Type 2 diabetes with nephropathy (HCC) - Plan: HgB A1c, Comprehensive metabolic panel  Essential hypertension - Plan:  Comprehensive metabolic panel  Vitamin D deficiency - Plan: VITAMIN D 25 Hydroxy (Vit-D Deficiency, Fractures)  Elevated LFTs - Plan: Comprehensive metabolic panel  1. Rx changes: none hemoglobin A1c 7.4%  2. Education: Reviewed 'ABCs' of diabetes management (respective goals in parentheses):  A1C (<7), blood pressure (<130/80), and cholesterol (LDL <100). 3. Compliance at present is estimated to be good. Efforts to improve compliance (if necessary) will be directed at dietary modifications: start eating more protein and less empty  carbs and sugars. , increased exercise and regular blood sugar monitoring: 2-3  times weekly. 4. Follow up: 4 months  5. HTN- BP is within goal range. Continue on current medication.  6. Discussed improving her diet and being more active as tolerated. Counseled on healthy lifestyle including keeping her blood pressure and blood sugars under good control to maintain kidney function.  7. Discussed that her liver enzymes were marginally elevated and will repeat these today.  8. Plan to check vitamin D level.  9. Advised her to keep an eye on her weight, it has been stable for several months.  10. Advised to call and schedule a diabetic eye exam.  11. Follow up for a fasting medcheck in 4 months, check lipids then.

## 2016-11-04 DIAGNOSIS — I2510 Atherosclerotic heart disease of native coronary artery without angina pectoris: Secondary | ICD-10-CM | POA: Diagnosis not present

## 2016-11-04 DIAGNOSIS — I255 Ischemic cardiomyopathy: Secondary | ICD-10-CM | POA: Diagnosis not present

## 2016-11-04 DIAGNOSIS — I420 Dilated cardiomyopathy: Secondary | ICD-10-CM | POA: Diagnosis not present

## 2016-11-04 DIAGNOSIS — Z006 Encounter for examination for normal comparison and control in clinical research program: Secondary | ICD-10-CM | POA: Diagnosis not present

## 2016-11-04 LAB — VITAMIN D 25 HYDROXY (VIT D DEFICIENCY, FRACTURES): VIT D 25 HYDROXY: 35 ng/mL (ref 30–100)

## 2016-11-11 ENCOUNTER — Other Ambulatory Visit: Payer: Self-pay | Admitting: *Deleted

## 2016-11-11 DIAGNOSIS — I770 Arteriovenous fistula, acquired: Secondary | ICD-10-CM

## 2016-11-12 DIAGNOSIS — E119 Type 2 diabetes mellitus without complications: Secondary | ICD-10-CM | POA: Diagnosis not present

## 2016-11-12 DIAGNOSIS — H524 Presbyopia: Secondary | ICD-10-CM | POA: Diagnosis not present

## 2016-11-12 LAB — HM DIABETES EYE EXAM

## 2016-11-16 ENCOUNTER — Encounter: Payer: Self-pay | Admitting: Vascular Surgery

## 2016-11-25 ENCOUNTER — Ambulatory Visit (INDEPENDENT_AMBULATORY_CARE_PROVIDER_SITE_OTHER): Payer: Medicare Other | Admitting: Vascular Surgery

## 2016-11-25 ENCOUNTER — Ambulatory Visit (HOSPITAL_COMMUNITY)
Admission: RE | Admit: 2016-11-25 | Discharge: 2016-11-25 | Disposition: A | Discharge status: Home / Self Care | Payer: Medicare Other | Source: Ambulatory Visit | Attending: Vascular Surgery | Admitting: Vascular Surgery

## 2016-11-25 ENCOUNTER — Encounter: Payer: Self-pay | Admitting: Vascular Surgery

## 2016-11-25 VITALS — BP 154/87 | HR 95 | Resp 16 | Ht 64.0 in | Wt 131.0 lb

## 2016-11-25 DIAGNOSIS — I739 Peripheral vascular disease, unspecified: Secondary | ICD-10-CM | POA: Diagnosis not present

## 2016-11-25 DIAGNOSIS — I770 Arteriovenous fistula, acquired: Secondary | ICD-10-CM | POA: Diagnosis not present

## 2016-11-25 NOTE — Progress Notes (Signed)
Vitals:   11/25/16 1116  BP: (!) 153/80  Pulse: 95  Resp: 16  SpO2: 97%  Weight: 131 lb (59.4 kg)  Height: 5\' 4"  (1.626 m)

## 2016-11-25 NOTE — Progress Notes (Signed)
Referring Physician: Sherilyn Banker M.D.  Patient name: Rachel Oneill MRN: [id number] DOB: 1951/03/14 Sex: female  REASON FOR CONSULT: Left arm AV fistula, most likely traumatic  HPI: Rachel Oneill is a 66 y.o. female noted to have a left arm AV fistula on clinical exam by Dr. Nadara Eaton recently. The patient had a cardiac catheterization in November 2017 elsewhere. Records are not available from this. She states she had several needlesticks in her left arm but does not really room or whether or not her cardiac catheterization was done from the left arm. He denies any numbness tingling or coolness in her left hand. She has had difficulty with shortness of breath recently. Dr. Nadara Eaton had concerns as to whether or not this AV fistula may be contributing to congestive failure. Other medical problems include hypertension diabetes and bronchitis all of which are currently stable.  She does have a history of abdominal aortic aneurysm which is followed by Dr. Nadara Eaton. This last measured 3.6 cm in diameter April 2017. She is also followed by Dr. Nadara Eaton for peripheral arterial disease. Most recent ABIs were 0.7 on the right 0.65 on the left April 2017. She denies any claudication or rest pain symptoms.  Past Medical History:  Diagnosis Date  . Bronchitis   . CHF (congestive heart failure) (HCC)   . Hypertension   . Uncontrolled diabetes mellitus with complications [facility])    Past Surgical History:  Procedure Laterality Date  . CARDIAC SURGERY     double bypass 2007    Family History  Problem Relation Age of Onset  . Diabetes Mother   . Colon cancer Neg Hx   . Stomach cancer Neg Hx     SOCIAL HISTORY: Social History   Social History  . Marital status: Legally Separated    Spouse name: N/A  . Number of children: N/A  . Years of education: N/A   Occupational History  . food service [employer]   Social History Main Topics  . Smoking status: Former Smoker    Types: Cigarettes   Quit date: 05/31/2006  . Smokeless tobacco: Never Used  . Alcohol use No  . Drug use: No  . Sexual activity: Not on file   Other Topics Concern  . Not on file   Social History Narrative  . No narrative on file    Allergies  Allergen Reactions  . Shellfish Allergy Itching    Current Outpatient Prescriptions  Medication Sig Dispense Refill  . albuterol (PROVENTIL HFA;VENTOLIN HFA) 108 (90 Base) MCG/ACT inhaler Inhale 1 puff into the lungs every 6 (six) hours as needed for wheezing or shortness of breath.    Marland Kitchen aspirin 81 MG chewable tablet Chew 1 tablet (81 mg total) by mouth daily. 60 tablet 2  . atorvastatin (LIPITOR) 80 MG tablet Take 1 tablet (80 mg total) by mouth daily. 90 tablet 2  . ENTRESTO 97-103 MG Take 1 tablet by mouth 2 (two) times daily.     . indomethacin (INDOCIN) 25 MG capsule Take 25 mg by mouth 3 (three) times daily as needed.    . metFORMIN (GLUCOPHAGE) 500 MG tablet Take 1 tablet (500 mg total) by mouth 2 (two) times daily with a meal. Reported on 02/13/2016 60 tablet 5  . metoprolol succinate (TOPROL-XL) 50 MG 24 hr tablet Take 50 mg by mouth daily. Take with or immediately following a meal.    . potassium chloride SA (K-DUR,KLOR-CON) 20 MEQ tablet Take 1 tablet (20 mEq total)  when you take Lasix 30 tablet 2  . prasugrel (EFFIENT) 10 MG TABS tablet Take 10 mg by mouth daily.    . rosuvastatin (CRESTOR) 40 MG tablet     . spironolactone (ALDACTONE) 25 MG tablet Take 25 mg by mouth daily.     Marland Kitchen torsemide (DEMADEX) 20 MG tablet Take 20 mg by mouth daily.     No current facility-administered medications for this visit.     ROS:   General:  No weight loss, Fever, chills  HEENT: No recent headaches, no nasal bleeding, no visual changes, no sore throat  Neurologic: No dizziness, blackouts, seizures. No recent symptoms of stroke or mini- stroke. No recent episodes of slurred speech, or temporary blindness.  Cardiac: No recent episodes of chest pain/pressure, no  shortness of breath at rest.  + shortness of breath with exertion.  Denies history of atrial fibrillation or irregular heartbeat  Vascular: No history of rest pain in feet.  No history of claudication.  No history of non-healing ulcer, No history of DVT   Pulmonary: No home oxygen, no productive cough, no hemoptysis,  + asthma or wheezing  Musculoskeletal:  [ ]  Arthritis, [ ]  Low back pain,  [ ]  Joint pain  Hematologic:No history of hypercoagulable state.  No history of easy bleeding.  No history of anemia  Gastrointestinal: No hematochezia or melena,  No gastroesophageal reflux, no trouble swallowing  Urinary: [ ]  chronic Kidney disease, [ ]  on HD - [ ]  MWF or [ ]  TTHS, [ ]  Burning with urination, [ ]  Frequent urination, [ ]  Difficulty urinating;   Skin: No rashes  Psychological: No history of anxiety,  No history of depression   Physical Examination  Vitals:   11/25/16 1116 11/25/16 1122  BP: (!) 153/80 (!) 154/87  Pulse: 95 95  Resp: 16   SpO2: 97%   Weight: 131 lb (59.4 kg)   Height: 5\' 4"  (1.626 m)     Body mass index is 22.49 kg/m.  General:  Alert and oriented, no acute distress HEENT: Normal Neck: No bruit or JVD Pulmonary: Clear to auscultation bilaterally Cardiac: Regular Rate and Rhythm without murmur Abdomen: Soft, non-tender, non-distended, no mass Skin: No rash Extremity Pulses:  2+ radial, brachial pulses bilaterally, palpable thrill left antecubital region Musculoskeletal: No deformity or edema  Neurologic: Upper and lower extremity motor 5/5 and symmetric  DATA:  Patient had a duplex ultrasound of her left upper extremity today. This showed an AV fistula communication near the antecubital region left arm.  ASSESSMENT:  Left arm AV fistula secondary to trauma. This AV fistula may be contributed to some of her congestive failure symptoms. The patient currently is on Effient and aspirin. I discussed with Dr. Nadara Eaton on the phone today that we would need to  have her off her Effient for her operation. He will transfer her over to Plavix and aspirin.   PLAN:  Ligation of left arm AV fistula scheduled for 12/08/2016. Risks benefits possible complications and procedure details were discussed with the patient today including not limited to bleeding and infection. She understands and agrees to proceed.   Fabienne Bruns, MD [facility] Office: [phone] Pager: [phone]

## 2016-11-26 ENCOUNTER — Ambulatory Visit (INDEPENDENT_AMBULATORY_CARE_PROVIDER_SITE_OTHER): Payer: Medicare Other | Admitting: Cardiology

## 2016-11-26 ENCOUNTER — Encounter: Payer: Self-pay | Admitting: Cardiology

## 2016-11-26 ENCOUNTER — Encounter (INDEPENDENT_AMBULATORY_CARE_PROVIDER_SITE_OTHER): Payer: Self-pay

## 2016-11-26 ENCOUNTER — Encounter: Payer: Self-pay | Admitting: *Deleted

## 2016-11-26 ENCOUNTER — Other Ambulatory Visit: Payer: Self-pay | Admitting: Cardiology

## 2016-11-26 VITALS — BP 126/78 | HR 79 | Ht 64.0 in | Wt 133.0 lb

## 2016-11-26 DIAGNOSIS — Z01812 Encounter for preprocedural laboratory examination: Secondary | ICD-10-CM

## 2016-11-26 DIAGNOSIS — I255 Ischemic cardiomyopathy: Secondary | ICD-10-CM | POA: Diagnosis not present

## 2016-11-26 DIAGNOSIS — I5022 Chronic systolic (congestive) heart failure: Secondary | ICD-10-CM | POA: Diagnosis not present

## 2016-11-26 NOTE — Patient Instructions (Signed)
Medication Instructions:    Your physician recommends that you continue on your current medications as directed. Please refer to the Current Medication list given to you today.  --- If you need a refill on your cardiac medications before your next appointment, please call your pharmacy. ---  Labwork:  Your physician recommends that you return for pre procedure lab work between        5/8 - 5/18  Testing/Procedures: Your physician has recommended that you have a defibrillator inserted. An implantable cardioverter defibrillator (ICD) is a small device that is placed in your chest or, in rare cases, your abdomen. This device uses electrical pulses or shocks to help control life-threatening, irregular heartbeats that could lead the heart to suddenly stop beating (sudden cardiac arrest). Leads are attached to the ICD that goes into your heart. This is done in the hospital and usually requires an overnight stay. Please see the instruction sheet given to you today for more information.  Follow-Up:  Your physician recommends that you schedule a wound check appointment 10-14 days, after your procedure on 12/29/16, with the device clinic.   Your physician recommends that you schedule a follow up appointment in 3 months, after your procedure on 12/29/16, with Dr. Elberta Fortis.  Thank you for choosing [facility]!!   Dory Horn, RN [phone]  Any Other Special Instructions Will Be Listed Below (If Applicable).   Cardioverter Defibrillator Implantation An implantable cardioverter defibrillator (ICD) is a small, lightweight, battery-powered device that is placed (implanted) under the skin in the chest or abdomen. Your caregiver may prescribe an ICD if:  You have had an irregular heart rhythm (arrhythmia) that originated in the lower chambers of the heart (ventricles).  Your heart has been damaged by a disease (such as coronary artery disease) or heart condition (such as a heart attack). An  ICD consists of a battery that lasts several years, a small computer called a pulse generator, and wires called leads that go into the heart. It is used to detect and correct two dangerous arrhythmias: a rapid heart rhythm (tachycardia) and an arrhythmia in which the ventricles contract in an uncoordinated way (fibrillation). When an ICD detects tachycardia, it sends an electrical signal to the heart that restores the heartbeat to normal (cardioversion). This signal is usually painless. If cardioversion does not work or if the ICD detects fibrillation, it delivers a small electrical shock to the heart (defibrillation) to restart the heart. The shock may feel like a strong jolt in the chest.ICDs may be programmed to correct other problems. Sometimes, ICDs are programmed to act as another type of implantable device called a pacemaker. Pacemakers are used to treat a slow heartbeat (bradycardia). LET YOUR CAREGIVER KNOW ABOUT:  Any allergies you have.  All medicines you are taking, including vitamins, herbs, eyedrops, and over-the-counter medicines and creams.  Previous problems you or members of your family have had with the use of anesthetics.  Any blood disorders you have had.  Other health problems you have. RISKS AND COMPLICATIONS Generally, the procedure to implant an ICD is safe. However, as with any surgical procedure, complications can occur. Possible complications associated with implanting an ICD include:  Swelling, bleeding, or bruising at the site where the ICD was implanted.  Infection at the site where the ICD was implanted.  A reaction to medicine used during the procedure.  Nerve, heart, or blood vessel damage.  Blood clots. BEFORE THE PROCEDURE  You may need to have blood tests, heart tests, or  a chest X-ray done before the day of the procedure.  Ask your caregiver about changing or stopping your regular medicines.  Make plans to have someone drive you home. You may need  to stay in the hospital overnight after the procedure.  Stop smoking at least 24 hours before the procedure.  Take a bath or shower the night before the procedure. You may need to scrub your chest or abdomen with a special type of soap.  Do not eat or drink before your procedure for as long as directed by your caregiver. Ask if it is okay to take any needed medicine with a small sip of water. PROCEDURE  The procedure to implant an ICD in your chest or abdomen is usually done at a hospital in a room that has a large X-ray machine called a fluoroscope. The machine will be above you during the procedure. It will help your caregiver see your heart during the procedure. Implanting an ICD usually takes 1-3 hours. Before the procedure:   Small monitors will be put on your body. They will be used to check your heart, blood pressure, and oxygen level.  A needle will be put into a vein in your hand or arm. This is called an intravenous (IV) access tube. Fluids and medicine will flow directly into your body through the IV tube.  Your chest or abdomen will be cleaned with a germ-killing (antiseptic) solution. The area may be shaved.  You may be given medicine to help you relax (sedative).  You will be given a medicine called a local anesthetic. This medicine will make the surgical site numb while the ICD is implanted. You will be sleepy but awake during the procedure. After you are numb the procedure will begin. The caregiver will:  Make a small cut (incision). This will make a pocket deep under your skin that will hold the pulse generator.  Guide the leads through a large blood vessel into your heart and attach them to the heart muscles. Depending on the ICD, the leads may go into one ventricle or they may go to both ventricles and into an upper chamber of the heart (atrium).  Test the ICD.  Close the incision with stitches, glue, or staples. AFTER THE PROCEDURE  You may feel pain. Some pain is  normal. It may last a few days.  You may stay in a recovery area until the local anesthetic has worn off. Your blood pressure and pulse will be checked often. You will be taken to a room where your heart will be monitored.  A chest X-ray will be taken. This is done to check that the cardioverter defibrillator is in the right place.  You may stay in the hospital overnight.  A slight bump may be seen over the skin where the ICD was placed. Sometimes, it is possible to feel the ICD under the skin. This is normal.  In the months and years afterward, your caregiver will check the device, the leads, and the battery every few months. Eventually, when the battery is low, the ICD will be replaced.   This information is not intended to replace advice given to you by your health care provider. Make sure you discuss any questions you have with your health care provider.   Document Released: 04/18/2002 Document Revised: 05/17/2013 Document Reviewed: 08/15/2012 Elsevier Interactive Patient Education [employer]

## 2016-11-26 NOTE — Progress Notes (Signed)
Electrophysiology Office Note   Date:  11/26/2016   ID:  Rachel Oneill, DOB 1951-03-10, MRN [id number]  PCP:  Hetty Blend, NP-C  Cardiologist:  Jacinto Halim Primary Electrophysiologist:  Will Jorja Loa, MD    Chief Complaint  Patient presents with  . New Patient (Initial Visit)    Chronic systolic CHF     History of Present Illness: Rachel Oneill is a 66 y.o. female who is being seen today for the evaluation of CHF at the request of Henson, Vickie L, NP-C. Presenting today for electrophysiology evaluation.   Per review of prior records: She has a history of hypertension, hyperlipidemia, coronary disease status post CABG in 2007 with a vein to the OM1, vein to the PDA, mitral valve ring replacement due to severe MR due to mitral valve prolapse. Previous history of coronary stenting to the RCA in 2006 into the circumflex in 2006 and 2004. History of peripheral arterial disease with bilateral iliac artery stenting in 2009 and a small AAA. Previous history of cigarette smoking but quit in 2007. 2017, developed worsening lower extremity edema markedly dyspnea on exertion and was admitted for an an NSTEMI. Underwent angioplasty to the LAD with extract GEN of thrombectomy and staged PCI to the proximal circumflex bypass with a drug-eluting stent.    Today, she denies symptoms of palpitations, chest pain, shortness of breath, orthopnea, PND, lower extremity edema, claudication, dizziness, presyncope, syncope, bleeding, or neurologic sequela. The patient is tolerating medications without difficulties.    Past Medical History:  Diagnosis Date  . Bronchitis   . CHF (congestive heart failure) (HCC)   . Hypertension   . Uncontrolled diabetes mellitus with complications [facility])    Past Surgical History:  Procedure Laterality Date  . CARDIAC SURGERY     double bypass 2007     Current Outpatient Prescriptions  Medication Sig Dispense Refill  . albuterol (PROVENTIL HFA;VENTOLIN HFA) 108  (90 Base) MCG/ACT inhaler Inhale 1 puff into the lungs every 6 (six) hours as needed for wheezing or shortness of breath.    Marland Kitchen aspirin 81 MG chewable tablet Chew 1 tablet (81 mg total) by mouth daily. 60 tablet 2  . atorvastatin (LIPITOR) 80 MG tablet Take 1 tablet (80 mg total) by mouth daily. 90 tablet 2  . ENTRESTO 97-103 MG Take 1 tablet by mouth 2 (two) times daily.     . indomethacin (INDOCIN) 25 MG capsule Take 25 mg by mouth 3 (three) times daily as needed.    . metFORMIN (GLUCOPHAGE) 500 MG tablet Take 1 tablet (500 mg total) by mouth 2 (two) times daily with a meal. Reported on 02/13/2016 60 tablet 5  . metoprolol succinate (TOPROL-XL) 50 MG 24 hr tablet Take 50 mg by mouth daily. Take with or immediately following a meal.    . potassium chloride SA (K-DUR,KLOR-CON) 20 MEQ tablet Take 1 tablet (20 mEq total) when you take Lasix 30 tablet 2  . prasugrel (EFFIENT) 10 MG TABS tablet Take 10 mg by mouth daily.    Marland Kitchen spironolactone (ALDACTONE) 25 MG tablet Take 25 mg by mouth daily.     Marland Kitchen torsemide (DEMADEX) 20 MG tablet Take 20 mg by mouth daily.     No current facility-administered medications for this visit.     Allergies:   Shellfish allergy   Social History:  The patient  reports that she quit smoking about 10 years ago. Her smoking use included Cigarettes. She has never used smokeless tobacco. She reports that  she does not drink alcohol or use drugs.   Family History:  The patient's family history includes Cancer in her father and mother; Diabetes in her mother.    ROS:  Please see the history of present illness.   Otherwise, review of systems is positive for none.   All other systems are reviewed and negative.    PHYSICAL EXAM: VS:  BP 126/78   Pulse 79   Ht 5\' 4"  (1.626 m)   Wt 133 lb (60.3 kg)   BMI 22.83 kg/m  , BMI Body mass index is 22.83 kg/m. GEN: Well nourished, well developed, in no acute distress  HEENT: normal  Neck: no JVD, carotid bruits, or masses Cardiac:  iRRR; no murmurs, rubs, or gallops,no edema  Respiratory:  clear to auscultation bilaterally, normal work of breathing GI: soft, nontender, nondistended, + BS MS: no deformity or atrophy  Skin: warm and dry Neuro:  Strength and sensation are intact Psych: euthymic mood, full affect  EKG:  EKG is ordered today. Personal review of the ekg ordered shows sinus rhythm, prolonged QT, inferior posterior infarct old, PVCs multifocal   Recent Labs: 05/31/2016: B Natriuretic Peptide 2,873.6 08/06/2016: Hemoglobin 11.7; Platelets 234; TSH 3.38 11/03/2016: ALT 20; BUN 24; Creat 1.48; Potassium 3.4; Sodium 140    Lipid Panel     Component Value Date/Time   CHOL 140 08/06/2016 0930   TRIG 85 08/06/2016 0930   HDL 40 (L) 08/06/2016 0930   CHOLHDL 3.5 08/06/2016 0930   VLDL 17 08/06/2016 0930   LDLCALC 83 08/06/2016 0930     Wt Readings from Last 3 Encounters:  11/26/16 133 lb (60.3 kg)  11/25/16 131 lb (59.4 kg)  11/03/16 131 lb (59.4 kg)      Other studies Reviewed: Additional studies/ records that were reviewed today include: TTE 09/29/16 Review of the above records today demonstrates:  LV cavity normal size. Grade 2 diastolic dysfunction. Severe diffuse hypokinesis of the inferior inferolateral and inferoseptal regions. EF 15% Left atrium severely dilated. Mild calcification of the mitral valve annulus. Mitral valve ring noted. No mitral stenosis, mild to moderate mitral regurgitation. Moderate tricuspid regurgitation. Pulmonary artery systolic pressure 52 mmHg.   Myoview, 11/18/15-personally reviewed LV moderately dilated with both rest and stress images. Ejection fraction 16%. Large scar involving the entire lateral wall from the base to the apex. No significant peri-infarct ischemia.  ASSESSMENT AND PLAN:  1.  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 Left atrium severely dilated. Patient was referred for primary prevention  ICD possibility. Risks and benefits of the procedure were discussed. Risks include bleeding, tamponade, infection, and pneumothorax, among others.Her stance is risks and has agreed to the procedure.  2. Coronary artery disease. No ischemia seen on a recent Myoview in 2017. Continue current management.  3. Hyperlipidemia: Was recently switched to Crestor. Continue current management.  4. Hypertension: Blood pressure is well controlled today. No changes necessary.  Current medicines are reviewed at length with the patient today.   The patient does not have concerns regarding her medicines.  The following changes were made today:  none  Labs/ tests ordered today include:  Orders Placed This Encounter  Procedures  . Basic Metabolic Panel (BMET)  . CBC w/Diff  . EKG 12-Lead     Disposition:   FU with Will Camnitz 4 months  Signed, Will Jorja Loa, MD  11/26/2016 2:53 PM     [facility] [address] [employer]  [address] [place] [address] [phone] (office) [phone] (fax)

## 2016-11-27 ENCOUNTER — Other Ambulatory Visit: Payer: Self-pay

## 2016-12-04 ENCOUNTER — Encounter (HOSPITAL_COMMUNITY): Payer: Self-pay | Admitting: *Deleted

## 2016-12-04 NOTE — Progress Notes (Signed)
I spoke with Dr Chilton Si regarding patient cardiac history, EF of 15% and the 2 surgeries she has in May: May 1 AV Fistula ligation left arm, ICD insertion 5/22.  Dr Chilton Si instructed me to call Dr Jodi Marble office to see if feels the patient is ok to have fistula surgery prior to having ICD insertion.    I have called patient today and unable to reach her, pharmacy spoke with her, but Plavix that was not listed on current medication list. I called Dr Jodi Marble office and spoke with April and asked if Dr Jodi Marble office had provided patient with a Plavix prescription and to ask if Dr Nadara Eaton is ok with patient having fistula surgery prior to ICD placement.  April reported that a prescription for Plavix was called to patients pharmacy. I called [facility] on [address] and was told that  picked u prescription of Plavix was picked up for patient.  April spoke with Dr Nadara Eaton and he said it is ok for patient to have fistula surgery prior to ICD placement.

## 2016-12-07 ENCOUNTER — Encounter (HOSPITAL_COMMUNITY): Payer: Self-pay | Admitting: *Deleted

## 2016-12-07 NOTE — Anesthesia Preprocedure Evaluation (Addendum)
Anesthesia Evaluation   Patient awake    Reviewed: Allergy & Precautions, NPO status , Patient's Chart, lab work & pertinent test results  Airway Mallampati: I  TM Distance: >3 FB Neck ROM: full    Dental no notable dental hx. (+) Edentulous Upper, Dental Advidsory Given, Upper Dentures   Pulmonary former smoker,    Pulmonary exam normal breath sounds clear to auscultation       Cardiovascular hypertension, Pt. on medications and Pt. on home beta blockers + CAD, + Cardiac Stents, + Peripheral Vascular Disease and +CHF  Normal cardiovascular exam Rhythm:Regular Rate:Normal     Neuro/Psych    GI/Hepatic   Endo/Other  diabetes  Renal/GU      Musculoskeletal   Abdominal Normal abdominal exam  (+)   Peds  Hematology   Anesthesia Other Findings   Reproductive/Obstetrics                           Anesthesia Physical Anesthesia Plan  ASA: IV  Anesthesia Plan: MAC   Post-op Pain Management:    Induction: Intravenous  Airway Management Planned: Natural Airway and Simple Face Mask  Additional Equipment:   Intra-op Plan:   Post-operative Plan:   Informed Consent: I have reviewed the patients History and Physical, chart, labs and discussed the procedure including the risks, benefits and alternatives for the proposed anesthesia with the patient or authorized representative who has indicated his/her understanding and acceptance.   Dental Advisory Given  Plan Discussed with: CRNA and Surgeon  Anesthesia Plan Comments:       Anesthesia Quick Evaluation

## 2016-12-07 NOTE — Progress Notes (Signed)
Ms Barkalow denies chest pain, does have shortness of breath at times.  Patient reports that CBG's run around 120. I instructed patient to check CBG to check CBG and if it is less than 70 to treat it with 1/2 cup of clear juice like apple juice or cranberry juice. I instructed patient to recheck CBG in 15 minutes and if CBG is not greater than 70, to  Call [phone] (pre- op). If it is before pre-op opens to retreat as before and recheck CBG in 15 minutes. I told patient to make note of time that liquid is taken and amount, that surgical time may have to be adjusted.

## 2016-12-08 ENCOUNTER — Ambulatory Visit (HOSPITAL_COMMUNITY): Payer: Medicare Other | Admitting: Anesthesiology

## 2016-12-08 ENCOUNTER — Encounter (HOSPITAL_COMMUNITY)
Admission: RE | Disposition: A | Discharge status: Home / Self Care | Payer: Self-pay | Source: Ambulatory Visit | Attending: Vascular Surgery

## 2016-12-08 ENCOUNTER — Encounter (HOSPITAL_COMMUNITY): Payer: Self-pay | Admitting: Anesthesiology

## 2016-12-08 ENCOUNTER — Telehealth: Payer: Self-pay | Admitting: Vascular Surgery

## 2016-12-08 ENCOUNTER — Ambulatory Visit (HOSPITAL_COMMUNITY)
Admission: RE | Admit: 2016-12-08 | Discharge: 2016-12-08 | Disposition: A | Discharge status: Home / Self Care | Payer: Medicare Other | Source: Ambulatory Visit | Attending: Vascular Surgery | Admitting: Vascular Surgery

## 2016-12-08 DIAGNOSIS — Z91013 Allergy to seafood: Secondary | ICD-10-CM | POA: Insufficient documentation

## 2016-12-08 DIAGNOSIS — X58XXXA Exposure to other specified factors, initial encounter: Secondary | ICD-10-CM | POA: Diagnosis not present

## 2016-12-08 DIAGNOSIS — I2510 Atherosclerotic heart disease of native coronary artery without angina pectoris: Secondary | ICD-10-CM | POA: Diagnosis not present

## 2016-12-08 DIAGNOSIS — Z955 Presence of coronary angioplasty implant and graft: Secondary | ICD-10-CM | POA: Insufficient documentation

## 2016-12-08 DIAGNOSIS — Z7982 Long term (current) use of aspirin: Secondary | ICD-10-CM | POA: Insufficient documentation

## 2016-12-08 DIAGNOSIS — Z833 Family history of diabetes mellitus: Secondary | ICD-10-CM | POA: Insufficient documentation

## 2016-12-08 DIAGNOSIS — Z87891 Personal history of nicotine dependence: Secondary | ICD-10-CM | POA: Diagnosis not present

## 2016-12-08 DIAGNOSIS — Z7984 Long term (current) use of oral hypoglycemic drugs: Secondary | ICD-10-CM | POA: Diagnosis not present

## 2016-12-08 DIAGNOSIS — E1151 Type 2 diabetes mellitus with diabetic peripheral angiopathy without gangrene: Secondary | ICD-10-CM | POA: Diagnosis not present

## 2016-12-08 DIAGNOSIS — S45192A Other specified injury of brachial artery, left side, initial encounter: Secondary | ICD-10-CM | POA: Diagnosis present

## 2016-12-08 DIAGNOSIS — I5022 Chronic systolic (congestive) heart failure: Secondary | ICD-10-CM | POA: Diagnosis not present

## 2016-12-08 DIAGNOSIS — I770 Arteriovenous fistula, acquired: Secondary | ICD-10-CM | POA: Diagnosis not present

## 2016-12-08 DIAGNOSIS — N189 Chronic kidney disease, unspecified: Secondary | ICD-10-CM | POA: Diagnosis not present

## 2016-12-08 DIAGNOSIS — I509 Heart failure, unspecified: Secondary | ICD-10-CM | POA: Diagnosis not present

## 2016-12-08 DIAGNOSIS — I739 Peripheral vascular disease, unspecified: Secondary | ICD-10-CM | POA: Insufficient documentation

## 2016-12-08 DIAGNOSIS — Z79899 Other long term (current) drug therapy: Secondary | ICD-10-CM | POA: Insufficient documentation

## 2016-12-08 DIAGNOSIS — Z951 Presence of aortocoronary bypass graft: Secondary | ICD-10-CM | POA: Insufficient documentation

## 2016-12-08 DIAGNOSIS — I129 Hypertensive chronic kidney disease with stage 1 through stage 4 chronic kidney disease, or unspecified chronic kidney disease: Secondary | ICD-10-CM | POA: Diagnosis not present

## 2016-12-08 DIAGNOSIS — I110 Hypertensive heart disease with heart failure: Secondary | ICD-10-CM | POA: Diagnosis not present

## 2016-12-08 HISTORY — DX: Pneumonia, unspecified organism: J18.9

## 2016-12-08 HISTORY — PX: LIGATION OF ARTERIOVENOUS  FISTULA: SHX5948

## 2016-12-08 LAB — GLUCOSE, CAPILLARY
Glucose-Capillary: 117 mg/dL — ABNORMAL HIGH (ref 65–99)
Glucose-Capillary: 119 mg/dL — ABNORMAL HIGH (ref 65–99)

## 2016-12-08 LAB — POCT I-STAT 4, (NA,K, GLUC, HGB,HCT)
GLUCOSE: 126 mg/dL — AB (ref 65–99)
HEMATOCRIT: 39 % (ref 36.0–46.0)
HEMOGLOBIN: 13.3 g/dL (ref 12.0–15.0)
Potassium: 3.3 mmol/L — ABNORMAL LOW (ref 3.5–5.1)
Sodium: 143 mmol/L (ref 135–145)

## 2016-12-08 SURGERY — LIGATION OF ARTERIOVENOUS  FISTULA
Anesthesia: Monitor Anesthesia Care | Site: Arm Upper | Laterality: Left

## 2016-12-08 MED ORDER — SODIUM CHLORIDE 0.9 % IV SOLN
INTRAVENOUS | Status: DC | PRN
  Administered 2016-12-08: 500 mL

## 2016-12-08 MED ORDER — CHLORHEXIDINE GLUCONATE CLOTH 2 % EX PADS: 6.0000 | MEDICATED_PAD | Freq: Once | CUTANEOUS | Status: DC

## 2016-12-08 MED ORDER — OXYCODONE-ACETAMINOPHEN 5-325 MG PO TABS: 1.0000 | ORAL_TABLET | Freq: Four times a day (QID) | ORAL | 0 refills | Status: DC | PRN

## 2016-12-08 MED ORDER — LIDOCAINE HCL (PF) 1 % IJ SOLN
INTRAMUSCULAR | Status: DC | PRN
Start: 2016-12-08 — End: 2016-12-08
  Administered 2016-12-08: 10 mL
  Administered 2016-12-08: 8 mL

## 2016-12-08 MED ORDER — PHENYLEPHRINE HCL 10 MG/ML IJ SOLN
INTRAMUSCULAR | Status: DC | PRN
  Administered 2016-12-08 (×3): 80 ug via INTRAVENOUS

## 2016-12-08 MED ORDER — HEPARIN SODIUM (PORCINE) 1000 UNIT/ML IJ SOLN
INTRAMUSCULAR | Status: AC
  Filled 2016-12-08: qty 1

## 2016-12-08 MED ORDER — 0.9 % SODIUM CHLORIDE (POUR BTL) OPTIME
TOPICAL | Status: DC | PRN
  Administered 2016-12-08: 1000 mL

## 2016-12-08 MED ORDER — PROPOFOL 500 MG/50ML IV EMUL
INTRAVENOUS | Status: DC | PRN
  Administered 2016-12-08: 75 ug/kg/min via INTRAVENOUS

## 2016-12-08 MED ORDER — PROPOFOL 10 MG/ML IV BOLUS
INTRAVENOUS | Status: AC
  Filled 2016-12-08: qty 20

## 2016-12-08 MED ORDER — MIDAZOLAM HCL 5 MG/5ML IJ SOLN
INTRAMUSCULAR | Status: DC | PRN
  Administered 2016-12-08: 2 mg via INTRAVENOUS

## 2016-12-08 MED ORDER — HEPARIN SODIUM (PORCINE) 1000 UNIT/ML IJ SOLN
INTRAMUSCULAR | Status: DC | PRN
  Administered 2016-12-08: 6000 [IU] via INTRAVENOUS

## 2016-12-08 MED ORDER — SODIUM CHLORIDE 0.9 % IV SOLN: INTRAVENOUS | Status: DC

## 2016-12-08 MED ORDER — FENTANYL CITRATE (PF) 100 MCG/2ML IJ SOLN
INTRAMUSCULAR | Status: DC | PRN
  Administered 2016-12-08: 25 ug via INTRAVENOUS

## 2016-12-08 MED ORDER — LIDOCAINE HCL 1 % IJ SOLN
INTRAMUSCULAR | Status: AC
Start: 2016-12-08 — End: ?
  Filled 2016-12-08: qty 20

## 2016-12-08 MED ORDER — LIDOCAINE HCL 1 % IJ SOLN
INTRAMUSCULAR | Status: AC
  Filled 2016-12-08: qty 20

## 2016-12-08 MED ORDER — MIDAZOLAM HCL 2 MG/2ML IJ SOLN
INTRAMUSCULAR | Status: AC
  Filled 2016-12-08: qty 2

## 2016-12-08 MED ORDER — SODIUM CHLORIDE 0.9 % IV SOLN
INTRAVENOUS | Status: DC | PRN
  Administered 2016-12-08 (×2): via INTRAVENOUS

## 2016-12-08 MED ORDER — FENTANYL CITRATE (PF) 100 MCG/2ML IJ SOLN
INTRAMUSCULAR | Status: AC
  Filled 2016-12-08: qty 2

## 2016-12-08 MED ORDER — DEXTROSE 5 % IV SOLN
1.5000 g | INTRAVENOUS | Status: AC
  Administered 2016-12-08: 1.5 g via INTRAVENOUS
  Filled 2016-12-08: qty 1.5

## 2016-12-08 SURGICAL SUPPLY — 32 items
ADH SKN CLS APL DERMABOND .7 (GAUZE/BANDAGES/DRESSINGS) ×1
AGENT HMST SPONGE THK3/8 (HEMOSTASIS)
CANISTER SUCT 3000ML PPV (MISCELLANEOUS) ×2 IMPLANT
DERMABOND ADVANCED (GAUZE/BANDAGES/DRESSINGS) ×1
DERMABOND ADVANCED .7 DNX12 (GAUZE/BANDAGES/DRESSINGS) ×1 IMPLANT
ELECT REM PT RETURN 9FT ADLT (ELECTROSURGICAL) ×2
ELECTRODE REM PT RTRN 9FT ADLT (ELECTROSURGICAL) ×1 IMPLANT
GLOVE BIO SURGEON STRL SZ7.5 (GLOVE) ×2 IMPLANT
GLOVE BIOGEL PI IND STRL 6.5 (GLOVE) IMPLANT
GLOVE BIOGEL PI IND STRL 7.0 (GLOVE) IMPLANT
GLOVE BIOGEL PI INDICATOR 6.5 (GLOVE) ×1
GLOVE BIOGEL PI INDICATOR 7.0 (GLOVE) ×1
GLOVE SKINSENSE NS SZ6.5 (GLOVE) ×2
GLOVE SKINSENSE NS SZ7.5 (GLOVE) ×1
GLOVE SKINSENSE STRL SZ6.5 (GLOVE) IMPLANT
GLOVE SKINSENSE STRL SZ7.5 (GLOVE) IMPLANT
GOWN STRL REUS W/ TWL LRG LVL3 (GOWN DISPOSABLE) ×3 IMPLANT
GOWN STRL REUS W/TWL LRG LVL3 (GOWN DISPOSABLE) ×6
HEMOSTAT SPONGE AVITENE ULTRA (HEMOSTASIS) IMPLANT
KIT BASIN OR (CUSTOM PROCEDURE TRAY) ×2 IMPLANT
KIT ROOM TURNOVER OR (KITS) ×2 IMPLANT
LOOP VESSEL MINI RED (MISCELLANEOUS) IMPLANT
NS IRRIG 1000ML POUR BTL (IV SOLUTION) ×2 IMPLANT
PACK CV ACCESS (CUSTOM PROCEDURE TRAY) ×2 IMPLANT
PAD ARMBOARD 7.5X6 YLW CONV (MISCELLANEOUS) ×4 IMPLANT
SUT PROLENE 6 0 CC (SUTURE) ×3 IMPLANT
SUT SILK 0 (SUTURE) IMPLANT
SUT VIC AB 3-0 SH 27 (SUTURE) ×2
SUT VIC AB 3-0 SH 27X BRD (SUTURE) ×1 IMPLANT
SUT VICRYL 4-0 PS2 18IN ABS (SUTURE) ×2 IMPLANT
UNDERPAD 30X30 (UNDERPADS AND DIAPERS) ×2 IMPLANT
WATER STERILE IRR 1000ML POUR (IV SOLUTION) ×2 IMPLANT

## 2016-12-08 NOTE — H&P (View-Only) (Signed)
Referring Physician: Sherilyn Banker M.D.  Patient name: Rachel Oneill MRN: [id number] DOB: 1951/03/14 Sex: female  REASON FOR CONSULT: Left arm AV fistula, most likely traumatic  HPI: Rachel Oneill is a 66 y.o. female noted to have a left arm AV fistula on clinical exam by Dr. Nadara Eaton recently. The patient had a cardiac catheterization in November 2017 elsewhere. Records are not available from this. She states she had several needlesticks in her left arm but does not really room or whether or not her cardiac catheterization was done from the left arm. He denies any numbness tingling or coolness in her left hand. She has had difficulty with shortness of breath recently. Dr. Nadara Eaton had concerns as to whether or not this AV fistula may be contributing to congestive failure. Other medical problems include hypertension diabetes and bronchitis all of which are currently stable.  She does have a history of abdominal aortic aneurysm which is followed by Dr. Nadara Eaton. This last measured 3.6 cm in diameter April 2017. She is also followed by Dr. Nadara Eaton for peripheral arterial disease. Most recent ABIs were 0.7 on the right 0.65 on the left April 2017. She denies any claudication or rest pain symptoms.  Past Medical History:  Diagnosis Date  . Bronchitis   . CHF (congestive heart failure) (HCC)   . Hypertension   . Uncontrolled diabetes mellitus with complications [facility])    Past Surgical History:  Procedure Laterality Date  . CARDIAC SURGERY     double bypass 2007    Family History  Problem Relation Age of Onset  . Diabetes Mother   . Colon cancer Neg Hx   . Stomach cancer Neg Hx     SOCIAL HISTORY: Social History   Social History  . Marital status: Legally Separated    Spouse name: N/A  . Number of children: N/A  . Years of education: N/A   Occupational History  . food service [employer]   Social History Main Topics  . Smoking status: Former Smoker    Types: Cigarettes   Quit date: 05/31/2006  . Smokeless tobacco: Never Used  . Alcohol use No  . Drug use: No  . Sexual activity: Not on file   Other Topics Concern  . Not on file   Social History Narrative  . No narrative on file    Allergies  Allergen Reactions  . Shellfish Allergy Itching    Current Outpatient Prescriptions  Medication Sig Dispense Refill  . albuterol (PROVENTIL HFA;VENTOLIN HFA) 108 (90 Base) MCG/ACT inhaler Inhale 1 puff into the lungs every 6 (six) hours as needed for wheezing or shortness of breath.    Marland Kitchen aspirin 81 MG chewable tablet Chew 1 tablet (81 mg total) by mouth daily. 60 tablet 2  . atorvastatin (LIPITOR) 80 MG tablet Take 1 tablet (80 mg total) by mouth daily. 90 tablet 2  . ENTRESTO 97-103 MG Take 1 tablet by mouth 2 (two) times daily.     . indomethacin (INDOCIN) 25 MG capsule Take 25 mg by mouth 3 (three) times daily as needed.    . metFORMIN (GLUCOPHAGE) 500 MG tablet Take 1 tablet (500 mg total) by mouth 2 (two) times daily with a meal. Reported on 02/13/2016 60 tablet 5  . metoprolol succinate (TOPROL-XL) 50 MG 24 hr tablet Take 50 mg by mouth daily. Take with or immediately following a meal.    . potassium chloride SA (K-DUR,KLOR-CON) 20 MEQ tablet Take 1 tablet (20 mEq total)  when you take Lasix 30 tablet 2  . prasugrel (EFFIENT) 10 MG TABS tablet Take 10 mg by mouth daily.    . rosuvastatin (CRESTOR) 40 MG tablet     . spironolactone (ALDACTONE) 25 MG tablet Take 25 mg by mouth daily.     Marland Kitchen torsemide (DEMADEX) 20 MG tablet Take 20 mg by mouth daily.     No current facility-administered medications for this visit.     ROS:   General:  No weight loss, Fever, chills  HEENT: No recent headaches, no nasal bleeding, no visual changes, no sore throat  Neurologic: No dizziness, blackouts, seizures. No recent symptoms of stroke or mini- stroke. No recent episodes of slurred speech, or temporary blindness.  Cardiac: No recent episodes of chest pain/pressure, no  shortness of breath at rest.  + shortness of breath with exertion.  Denies history of atrial fibrillation or irregular heartbeat  Vascular: No history of rest pain in feet.  No history of claudication.  No history of non-healing ulcer, No history of DVT   Pulmonary: No home oxygen, no productive cough, no hemoptysis,  + asthma or wheezing  Musculoskeletal:  [ ]  Arthritis, [ ]  Low back pain,  [ ]  Joint pain  Hematologic:No history of hypercoagulable state.  No history of easy bleeding.  No history of anemia  Gastrointestinal: No hematochezia or melena,  No gastroesophageal reflux, no trouble swallowing  Urinary: [ ]  chronic Kidney disease, [ ]  on HD - [ ]  MWF or [ ]  TTHS, [ ]  Burning with urination, [ ]  Frequent urination, [ ]  Difficulty urinating;   Skin: No rashes  Psychological: No history of anxiety,  No history of depression   Physical Examination  Vitals:   11/25/16 1116 11/25/16 1122  BP: (!) 153/80 (!) 154/87  Pulse: 95 95  Resp: 16   SpO2: 97%   Weight: 131 lb (59.4 kg)   Height: 5\' 4"  (1.626 m)     Body mass index is 22.49 kg/m.  General:  Alert and oriented, no acute distress HEENT: Normal Neck: No bruit or JVD Pulmonary: Clear to auscultation bilaterally Cardiac: Regular Rate and Rhythm without murmur Abdomen: Soft, non-tender, non-distended, no mass Skin: No rash Extremity Pulses:  2+ radial, brachial pulses bilaterally, palpable thrill left antecubital region Musculoskeletal: No deformity or edema  Neurologic: Upper and lower extremity motor 5/5 and symmetric  DATA:  Patient had a duplex ultrasound of her left upper extremity today. This showed an AV fistula communication near the antecubital region left arm.  ASSESSMENT:  Left arm AV fistula secondary to trauma. This AV fistula may be contributed to some of her congestive failure symptoms. The patient currently is on Effient and aspirin. I discussed with Dr. Nadara Eaton on the phone today that we would need to  have her off her Effient for her operation. He will transfer her over to Plavix and aspirin.   PLAN:  Ligation of left arm AV fistula scheduled for 12/08/2016. Risks benefits possible complications and procedure details were discussed with the patient today including not limited to bleeding and infection. She understands and agrees to proceed.   Fabienne Bruns, MD [facility] [place] Office: [phone] Pager: [phone]

## 2016-12-08 NOTE — Interval H&P Note (Signed)
History and Physical Interval Note:  12/08/2016 7:20 AM  Rachel Oneill  has presented today for surgery, with the diagnosis of Left Arm Arteriovenous Fistula, Aquired  I77.0  The various methods of treatment have been discussed with the patient and family. After consideration of risks, benefits and other options for treatment, the patient has consented to  Procedure(s): LIGATION OF ARTERIOVENOUS  FISTULA (Left) as a surgical intervention .  The patient's history has been reviewed, patient examined, no change in status, stable for surgery.  I have reviewed the patient's chart and labs.  Questions were answered to the patient's satisfaction.     Fabienne Bruns

## 2016-12-08 NOTE — Anesthesia Procedure Notes (Signed)
Procedure Name: MAC Date/Time: 12/08/2016 7:30 AM Performed by: Neldon Newport Pre-anesthesia Checklist: Timeout performed, Patient being monitored, Suction available, Emergency Drugs available and Patient identified Oxygen Delivery Method: Simple face mask Placement Confirmation: positive ETCO2 Dental Injury: Teeth and Oropharynx as per pre-operative assessment

## 2016-12-08 NOTE — Transfer of Care (Signed)
Immediate Anesthesia Transfer of Care Note  Patient: Rachel Oneill  Procedure(s) Performed: Procedure(s): LIGATION OF Left arm ARTERIOVENOUS  FISTULA (Left)  Patient Location: PACU  Anesthesia Type:MAC  Level of Consciousness: awake, alert  and oriented  Airway & Oxygen Therapy: Patient Spontanous Breathing  Post-op Assessment: Report given to RN, Post -op Vital signs reviewed and stable and Patient moving all extremities X 4  Post vital signs: Reviewed and stable  Last Vitals:  Vitals:   12/08/16 0616  BP: 127/76  Pulse: 69  Resp: 20  Temp: 36.4 C    Last Pain:  Vitals:   12/08/16 0616  TempSrc: Oral      Patients Stated Pain Goal: 2 (75/64/33 2951)  Complications: No apparent anesthesia complications

## 2016-12-08 NOTE — Anesthesia Postprocedure Evaluation (Addendum)
Anesthesia Post Note  Patient: Rachel Oneill  Procedure(s) Performed: Procedure(s) (LRB): LIGATION OF Left arm ARTERIOVENOUS  FISTULA (Left)  Patient location during evaluation: PACU Anesthesia Type: MAC Level of consciousness: awake Pain management: pain level controlled Vital Signs Assessment: post-procedure vital signs reviewed and stable Respiratory status: spontaneous breathing Cardiovascular status: stable Postop Assessment: no signs of nausea or vomiting Anesthetic complications: no        Last Vitals:  Vitals:   12/08/16 0905 12/08/16 0909  BP: (!) 122/92 (!) 137/97  Pulse: 64 68  Resp: 16 15  Temp:  36.4 C    Last Pain:  Vitals:   12/08/16 0905  TempSrc:   PainSc: 0-No pain   Pain Goal: Patients Stated Pain Goal: 2 (12/08/16 0653)               Rachel Oneill,Rachel Oneill

## 2016-12-08 NOTE — Telephone Encounter (Signed)
Spoke to pt to sch appt 01/07/17 at 3:00.

## 2016-12-08 NOTE — Telephone Encounter (Signed)
-----   Message from Sharee Pimple, RN sent at 12/08/2016  2:04 PM EDT ----- Regarding: 2-4 weeks   ----- Message ----- From: Dara Lords, PA-C Sent: 12/08/2016   8:47 AM To: [facility] Charge Pool  s/p ligation of left arm fistula 12/08/16.  f/u with Dr. Darrick Penna in 2-4 weeks.  Thanks, Lelon Mast

## 2016-12-08 NOTE — Op Note (Signed)
Procedure: Ligation of traumatic left arm AV fistula  Preoperative diagnosis: AV fistula left brachial artery  Postoperative diagnosis: Same  Anesthesia: Local with IV sedation  Asst.: Nurse  Operative findings: #1 2 mm communication between the brachial vein and brachial artery repaired with lateral repair.  Operative details: After informed consent, the patient was taken the operating room. The patient was placed in supine position operating table. After adequate sedation, the patient's left upper chest was prepped and draped in usual sterile fashion. Local anesthesia was ensured near the antecubital crease over the area of a palpable thrill. The incision was made in this location carried out the saphenous tissues down level brachial artery. Brachial artery was dissected free circumferentially above and below the area of the palpable thrill. Vessel loops were placed around this. The adjacent deep brachial vein was then dissected free circumferentially and controlled with 2-0 silk Potts loops. The patient was examined given 6000 units of intravenous heparin. An obvious claudication between the deep brachial vein and artery could be seen under direct vision. This was taken down sharply. There was a 2 mm opening between the brachial artery and deep brachial vein. Both of these were repaired with a running 6-0 Prolene suture. After apparent each of these the vessel loops and silk ties were released to check for hemostasis. One additional 6-0 Prolene suture was placed in the artery to achieve hemostasis. The patient had good Doppler flow in the brachial artery at this point. There is no further fistula claudication. I fully skeletonized the brachial artery in this location to make sure there were no other areas of claudication. Patient had a palpable radial pulse at this point. Hemostasis was obtained with direct pressure. The subcutaneous tissues were reapproximated using a running 3-0 Vicryl suture. The  skin was closed with 4-0 Vicryl subcuticular stitch. Dermabond was applied the incision. The patient tolerated the procedure well and there were no complications. Instrument sponge and needle count was correct at the end of the case. The patient was taken to the recovery room in stable condition.  Fabienne Bruns, MD [facility] [place] Office: [phone] Pager: [phone]

## 2016-12-09 ENCOUNTER — Encounter (HOSPITAL_COMMUNITY): Payer: Self-pay | Admitting: Vascular Surgery

## 2016-12-12 ENCOUNTER — Encounter (HOSPITAL_COMMUNITY): Payer: Self-pay

## 2016-12-12 ENCOUNTER — Emergency Department (HOSPITAL_COMMUNITY)
Admission: EM | Admit: 2016-12-12 | Discharge: 2016-12-12 | Disposition: A | Discharge status: Home / Self Care | Payer: Medicare Other | Attending: Emergency Medicine | Admitting: Emergency Medicine

## 2016-12-12 DIAGNOSIS — Z7982 Long term (current) use of aspirin: Secondary | ICD-10-CM | POA: Diagnosis not present

## 2016-12-12 DIAGNOSIS — I5022 Chronic systolic (congestive) heart failure: Secondary | ICD-10-CM | POA: Diagnosis not present

## 2016-12-12 DIAGNOSIS — I130 Hypertensive heart and chronic kidney disease with heart failure and stage 1 through stage 4 chronic kidney disease, or unspecified chronic kidney disease: Secondary | ICD-10-CM | POA: Insufficient documentation

## 2016-12-12 DIAGNOSIS — E1121 Type 2 diabetes mellitus with diabetic nephropathy: Secondary | ICD-10-CM | POA: Insufficient documentation

## 2016-12-12 DIAGNOSIS — Z951 Presence of aortocoronary bypass graft: Secondary | ICD-10-CM | POA: Diagnosis not present

## 2016-12-12 DIAGNOSIS — Z4801 Encounter for change or removal of surgical wound dressing: Secondary | ICD-10-CM | POA: Diagnosis not present

## 2016-12-12 DIAGNOSIS — Z87891 Personal history of nicotine dependence: Secondary | ICD-10-CM | POA: Insufficient documentation

## 2016-12-12 DIAGNOSIS — N183 Chronic kidney disease, stage 3 (moderate): Secondary | ICD-10-CM | POA: Insufficient documentation

## 2016-12-12 DIAGNOSIS — I2510 Atherosclerotic heart disease of native coronary artery without angina pectoris: Secondary | ICD-10-CM | POA: Insufficient documentation

## 2016-12-12 DIAGNOSIS — Z5189 Encounter for other specified aftercare: Secondary | ICD-10-CM

## 2016-12-12 DIAGNOSIS — E1122 Type 2 diabetes mellitus with diabetic chronic kidney disease: Secondary | ICD-10-CM | POA: Diagnosis not present

## 2016-12-12 DIAGNOSIS — Z79899 Other long term (current) drug therapy: Secondary | ICD-10-CM | POA: Diagnosis not present

## 2016-12-12 DIAGNOSIS — Z7984 Long term (current) use of oral hypoglycemic drugs: Secondary | ICD-10-CM | POA: Insufficient documentation

## 2016-12-12 NOTE — ED Triage Notes (Signed)
Pt presents with swelling and discoloration to surgical site to L arm (surgery on Tuesday to repair artery).  Pt on plavix, pt denies any injury.

## 2016-12-12 NOTE — ED Provider Notes (Signed)
[facility]-EMERGENCY DEPT Provider Note   CSN: [id number] Arrival date & time: 12/12/16  1255     History   Chief Complaint No chief complaint on file.   HPI Rachel Oneill is a 66 y.o. female.  Concern about discoloration beneath the surgical wound; "purple."  The patient had a graft revision, several days ago.  She is concerned about bruising in her left upper arm. She denies fever, chills, nausea, vomiting, weakness or dizziness.  There are no other known modifying factors.  HPI  Past Medical History:  Diagnosis Date  . Bronchitis   . CHF (congestive heart failure) (HCC)   . Hypertension   . Pneumonia    2008ish  . Uncontrolled diabetes mellitus with complications (HCC)    Type II    Patient Active Problem List   Diagnosis Date Noted  . Chronic kidney disease, stage 3, mod decreased GFR 08/07/2016  . Vitamin D deficiency 08/07/2016  . AAA (abdominal aortic aneurysm) without rupture (HCC) 11/04/2015  . PAD (peripheral artery disease) (HCC) 11/04/2015  . Chronic systolic heart failure (HCC) 10/11/2013  . CAD (coronary artery disease) of artery bypass graft 07/12/2013  . Hyperlipidemia LDL goal <100 07/12/2013  . Essential hypertension 07/12/2013  . Type 2 diabetes with nephropathy (HCC) 07/12/2013  . Mitral regurgitation 07/12/2013    Past Surgical History:  Procedure Laterality Date  . CARDIAC SURGERY     double bypass 2007  . CORONARY ARTERY BYPASS GRAFT    . LIGATION OF ARTERIOVENOUS  FISTULA Left 12/08/2016   Procedure: LIGATION OF Left arm ARTERIOVENOUS  FISTULA;  Surgeon: Sherren Kerns, MD;  Location: [facility] OR;  Service: Vascular;  Laterality: Left;    OB History    No data available       Home Medications    Prior to Admission medications   Medication Sig Start Date End Date Taking? Authorizing Provider  albuterol (PROVENTIL HFA;VENTOLIN HFA) 108 (90 Base) MCG/ACT inhaler Inhale 1 puff into the lungs every 6 (six) hours as needed for wheezing or  shortness of breath.    [provider]  aspirin 81 MG chewable tablet Chew 1 tablet (81 mg total) by mouth daily. 07/05/13   Richarda Overlie, MD  atorvastatin (LIPITOR) 80 MG tablet Take 1 tablet (80 mg total) by mouth daily. 10/10/13   Richarda Overlie, MD  clopidogrel (PLAVIX) 75 MG tablet Take 75 mg by mouth daily.    [provider]  ENTRESTO 97-103 MG Take 1 tablet by mouth 2 (two) times daily.  01/24/16   [provider]  furosemide (LASIX) 20 MG tablet Take 20 mg by mouth daily as needed for fluid.    [provider]  indomethacin (INDOCIN) 25 MG capsule Take 25 mg by mouth 3 (three) times daily as needed.    [provider]  metFORMIN (GLUCOPHAGE) 500 MG tablet Take 1 tablet (500 mg total) by mouth 2 (two) times daily with a meal. Reported on 02/13/2016 02/13/16   Hetty Blend L, NP-C  metoprolol succinate (TOPROL-XL) 50 MG 24 hr tablet Take 50 mg by mouth daily. Take with or immediately following a meal.    [provider]  oxyCODONE-acetaminophen (ROXICET) 5-325 MG tablet Take 1 tablet by mouth every 6 (six) hours as needed for severe pain. 12/08/16   Rhyne, Ames Coupe, PA-C  potassium chloride SA (K-DUR,KLOR-CON) 20 MEQ tablet Take 1 tablet (20 mEq total) when you take Lasix 02/13/16   Henson, Vickie L, NP-C  torsemide (DEMADEX) 20  MG tablet Take 20 mg by mouth daily.    [provider]    Family History Family History  Problem Relation Age of Onset  . Diabetes Mother   . Cancer Mother   . Cancer Father   . Colon cancer Neg Hx   . Stomach cancer Neg Hx     Social History Social History  Substance Use Topics  . Smoking status: Former Smoker    Years: 30.00    Types: Cigarettes    Quit date: 05/31/2006  . Smokeless tobacco: Never Used  . Alcohol use No     Allergies   Shellfish allergy   Review of Systems Review of Systems  All other systems reviewed and are negative.    Physical Exam Updated Vital Signs BP (!)  130/92 (BP Location: Right Arm)   Pulse 74   Temp 97.7 F (36.5 C) (Oral)   Resp 20   SpO2 94%   Physical Exam  Constitutional: She is oriented to person, place, and time. She appears well-developed and well-nourished. No distress.  HENT:  Head: Normocephalic and atraumatic.  Eyes: Conjunctivae and EOM are normal. Pupils are equal, round, and reactive to light.  Neck: Normal range of motion and phonation normal. Neck supple.  Cardiovascular: Normal rate.   Pulmonary/Chest: Effort normal.  Musculoskeletal: Normal range of motion.  Left upper with healing surgical wound, and dependent ecchymosis with edema, both mild.  Neurovascularly intact distally in the left hand.  Normal palpable thrill over graft in left upper arm.  Neurological: She is alert and oriented to person, place, and time. She exhibits normal muscle tone.  Skin: Skin is warm and dry.  Psychiatric: She has a normal mood and affect. Her behavior is normal. Judgment and thought content normal.  Nursing note and vitals reviewed.    ED Treatments / Results  Labs (all labs ordered are listed, but only abnormal results are displayed) Labs Reviewed - No data to display  EKG  EKG Interpretation None       Radiology No results found.  Procedures Procedures (including critical care time)  Medications Ordered in ED Medications - No data to display   Initial Impression / Assessment and Plan / ED Course  I have reviewed the triage vital signs and the nursing notes.  Pertinent labs & imaging results that were available during my care of the patient were reviewed by me and considered in my medical decision making (see chart for details).     Medications - No data to display  No data found.      Final Clinical Impressions(s) / ED Diagnoses   Final diagnoses:  Visit for wound check   Surgical wound, with usual healing, no sign for infection or vascular graft complication.  Nursing Notes Reviewed/ Care  Coordinated Applicable Imaging Reviewed Interpretation of Laboratory Data incorporated into ED treatment  The patient appears reasonably screened and/or stabilized for discharge and I doubt any other medical condition or other [facility] requiring further screening, evaluation, or treatment in the ED at this time prior to discharge.  Plan: Home Medications-APAP for pain; Home Treatments-rest heat elevation; return here if the recommended treatment, does not improve the symptoms; Recommended follow up-vascular surgery as scheduled and as needed.    New Prescriptions New Prescriptions   No medications on file     Mancel Bale, MD 12/16/16 [phone]

## 2016-12-12 NOTE — Discharge Instructions (Signed)
Elevate the left arm above your heart as much as possible.  Use heat on the sore area 3-4 times a day.

## 2016-12-12 NOTE — ED Notes (Signed)
Papers reviewed and they verbalize understanding of follow up going farther

## 2016-12-15 ENCOUNTER — Other Ambulatory Visit (INDEPENDENT_AMBULATORY_CARE_PROVIDER_SITE_OTHER): Payer: Medicare Other

## 2016-12-15 DIAGNOSIS — Z01812 Encounter for preprocedural laboratory examination: Secondary | ICD-10-CM | POA: Diagnosis not present

## 2016-12-15 DIAGNOSIS — I255 Ischemic cardiomyopathy: Secondary | ICD-10-CM

## 2016-12-15 DIAGNOSIS — I5022 Chronic systolic (congestive) heart failure: Secondary | ICD-10-CM | POA: Diagnosis not present

## 2016-12-15 DIAGNOSIS — I10 Essential (primary) hypertension: Secondary | ICD-10-CM | POA: Diagnosis not present

## 2016-12-16 LAB — BASIC METABOLIC PANEL
BUN / CREAT RATIO: 18 (ref 12–28)
BUN: 26 mg/dL (ref 8–27)
CALCIUM: 9.3 mg/dL (ref 8.7–10.3)
CO2: 22 mmol/L (ref 18–29)
CREATININE: 1.46 mg/dL — AB (ref 0.57–1.00)
Chloride: 100 mmol/L (ref 96–106)
GFR calc non Af Amer: 37 mL/min/{1.73_m2} — ABNORMAL LOW (ref >59)
GFR, EST AFRICAN AMERICAN: 43 mL/min/{1.73_m2} — AB (ref >59)
Glucose: 120 mg/dL — ABNORMAL HIGH (ref 65–99)
Potassium: 3.7 mmol/L (ref 3.5–5.2)
Sodium: 143 mmol/L (ref 134–144)

## 2016-12-16 LAB — CBC WITH DIFFERENTIAL/PLATELET
BASOS: 0 %
Basophils Absolute: 0 10*3/uL (ref 0.0–0.2)
EOS (ABSOLUTE): 0 10*3/uL (ref 0.0–0.4)
EOS: 1 %
HEMATOCRIT: 35.5 % (ref 34.0–46.6)
HEMOGLOBIN: 11.1 g/dL (ref 11.1–15.9)
IMMATURE GRANS (ABS): 0 10*3/uL (ref 0.0–0.1)
Immature Granulocytes: 0 %
LYMPHS ABS: 0.9 10*3/uL (ref 0.7–3.1)
LYMPHS: 16 %
MCH: 31.4 pg (ref 26.6–33.0)
MCHC: 31.3 g/dL — AB (ref 31.5–35.7)
MCV: 101 fL — AB (ref 79–97)
MONOCYTES: 9 %
Monocytes Absolute: 0.5 10*3/uL (ref 0.1–0.9)
NEUTROS ABS: 4.1 10*3/uL (ref 1.4–7.0)
Neutrophils: 74 %
Platelets: 181 10*3/uL (ref 150–379)
RBC: 3.53 x10E6/uL — ABNORMAL LOW (ref 3.77–5.28)
RDW: 14.7 % (ref 12.3–15.4)
WBC: 5.5 10*3/uL (ref 3.4–10.8)

## 2016-12-23 DIAGNOSIS — I2510 Atherosclerotic heart disease of native coronary artery without angina pectoris: Secondary | ICD-10-CM | POA: Diagnosis not present

## 2016-12-23 DIAGNOSIS — Z006 Encounter for examination for normal comparison and control in clinical research program: Secondary | ICD-10-CM | POA: Diagnosis not present

## 2016-12-23 DIAGNOSIS — I5042 Chronic combined systolic (congestive) and diastolic (congestive) heart failure: Secondary | ICD-10-CM | POA: Diagnosis not present

## 2016-12-23 DIAGNOSIS — I5043 Acute on chronic combined systolic (congestive) and diastolic (congestive) heart failure: Secondary | ICD-10-CM | POA: Diagnosis not present

## 2016-12-23 DIAGNOSIS — I255 Ischemic cardiomyopathy: Secondary | ICD-10-CM | POA: Diagnosis not present

## 2016-12-25 ENCOUNTER — Encounter: Payer: Self-pay | Admitting: Vascular Surgery

## 2016-12-29 ENCOUNTER — Ambulatory Visit (HOSPITAL_COMMUNITY)
Admission: RE | Admit: 2016-12-29 | Discharge: 2016-12-30 | Disposition: A | Discharge status: Home / Self Care | Payer: Medicare Other | Source: Ambulatory Visit | Attending: Cardiology | Admitting: Cardiology

## 2016-12-29 ENCOUNTER — Encounter (HOSPITAL_COMMUNITY): Payer: Self-pay | Admitting: *Deleted

## 2016-12-29 ENCOUNTER — Encounter (HOSPITAL_COMMUNITY)
Admission: RE | Disposition: A | Discharge status: Home / Self Care | Payer: Self-pay | Source: Ambulatory Visit | Attending: Cardiology

## 2016-12-29 ENCOUNTER — Other Ambulatory Visit: Payer: Self-pay

## 2016-12-29 DIAGNOSIS — I2510 Atherosclerotic heart disease of native coronary artery without angina pectoris: Secondary | ICD-10-CM | POA: Insufficient documentation

## 2016-12-29 DIAGNOSIS — Z951 Presence of aortocoronary bypass graft: Secondary | ICD-10-CM | POA: Diagnosis not present

## 2016-12-29 DIAGNOSIS — I5042 Chronic combined systolic (congestive) and diastolic (congestive) heart failure: Secondary | ICD-10-CM | POA: Insufficient documentation

## 2016-12-29 DIAGNOSIS — I255 Ischemic cardiomyopathy: Secondary | ICD-10-CM | POA: Diagnosis not present

## 2016-12-29 DIAGNOSIS — I110 Hypertensive heart disease with heart failure: Secondary | ICD-10-CM | POA: Insufficient documentation

## 2016-12-29 DIAGNOSIS — Z91013 Allergy to seafood: Secondary | ICD-10-CM | POA: Diagnosis not present

## 2016-12-29 DIAGNOSIS — Z7984 Long term (current) use of oral hypoglycemic drugs: Secondary | ICD-10-CM | POA: Insufficient documentation

## 2016-12-29 DIAGNOSIS — Z006 Encounter for examination for normal comparison and control in clinical research program: Secondary | ICD-10-CM | POA: Insufficient documentation

## 2016-12-29 DIAGNOSIS — E785 Hyperlipidemia, unspecified: Secondary | ICD-10-CM | POA: Insufficient documentation

## 2016-12-29 DIAGNOSIS — Z7982 Long term (current) use of aspirin: Secondary | ICD-10-CM | POA: Insufficient documentation

## 2016-12-29 DIAGNOSIS — Z87891 Personal history of nicotine dependence: Secondary | ICD-10-CM | POA: Insufficient documentation

## 2016-12-29 HISTORY — PX: ICD IMPLANT: EP1208

## 2016-12-29 LAB — SURGICAL PCR SCREEN
MRSA, PCR: NEGATIVE
Staphylococcus aureus: NEGATIVE

## 2016-12-29 LAB — GLUCOSE, CAPILLARY
GLUCOSE-CAPILLARY: 184 mg/dL — AB (ref 65–99)
GLUCOSE-CAPILLARY: 139 mg/dL — AB (ref 65–99)
Glucose-Capillary: 134 mg/dL — ABNORMAL HIGH (ref 65–99)
Glucose-Capillary: 131 mg/dL — ABNORMAL HIGH (ref 65–99)
Glucose-Capillary: 155 mg/dL — ABNORMAL HIGH (ref 65–99)

## 2016-12-29 SURGERY — ICD IMPLANT

## 2016-12-29 MED ORDER — CEFAZOLIN SODIUM-DEXTROSE 2-3 GM-% IV SOLR
INTRAVENOUS | Status: AC | PRN
  Administered 2016-12-29: 2 g via INTRAVENOUS

## 2016-12-29 MED ORDER — LIDOCAINE HCL (PF) 1 % IJ SOLN
INTRAMUSCULAR | Status: AC
  Filled 2016-12-29: qty 60

## 2016-12-29 MED ORDER — MUPIROCIN 2 % EX OINT
TOPICAL_OINTMENT | CUTANEOUS | Status: AC
  Filled 2016-12-29: qty 22

## 2016-12-29 MED ORDER — OXYCODONE HCL 5 MG PO TABS
5.0000 mg | ORAL_TABLET | Freq: Once | ORAL | Status: AC | PRN
  Administered 2016-12-30: 5 mg via ORAL
  Filled 2016-12-29 (×2): qty 1

## 2016-12-29 MED ORDER — ASPIRIN 81 MG PO CHEW
81.0000 mg | CHEWABLE_TABLET | Freq: Every day | ORAL | Status: DC
  Administered 2016-12-29 – 2016-12-30 (×2): 81 mg via ORAL
  Filled 2016-12-29 (×2): qty 1

## 2016-12-29 MED ORDER — OXYCODONE HCL 5 MG/5ML PO SOLN: 5.0000 mg | Freq: Once | ORAL | Status: AC | PRN

## 2016-12-29 MED ORDER — OXYCODONE-ACETAMINOPHEN 5-325 MG PO TABS
1.0000 | ORAL_TABLET | Freq: Four times a day (QID) | ORAL | Status: DC | PRN
  Administered 2016-12-29: 1 via ORAL
  Filled 2016-12-29: qty 1

## 2016-12-29 MED ORDER — CEFAZOLIN SODIUM-DEXTROSE 2-4 GM/100ML-% IV SOLN
2.0000 g | INTRAVENOUS | Status: DC
  Filled 2016-12-29: qty 100

## 2016-12-29 MED ORDER — LIDOCAINE HCL (PF) 1 % IJ SOLN
INTRAMUSCULAR | Status: DC | PRN
  Administered 2016-12-29: 45 mL via SUBCUTANEOUS

## 2016-12-29 MED ORDER — METFORMIN HCL 500 MG PO TABS: 500.0000 mg | ORAL_TABLET | Freq: Two times a day (BID) | ORAL | Status: DC

## 2016-12-29 MED ORDER — ALBUTEROL SULFATE (2.5 MG/3ML) 0.083% IN NEBU: 3.0000 mL | INHALATION_SOLUTION | Freq: Four times a day (QID) | RESPIRATORY_TRACT | Status: DC | PRN

## 2016-12-29 MED ORDER — SODIUM CHLORIDE 0.9 % IR SOLN
Status: AC
  Filled 2016-12-29: qty 2

## 2016-12-29 MED ORDER — ACETAMINOPHEN 325 MG PO TABS: 325.0000 mg | ORAL_TABLET | ORAL | Status: DC | PRN

## 2016-12-29 MED ORDER — TORSEMIDE 20 MG PO TABS
20.0000 mg | ORAL_TABLET | Freq: Every day | ORAL | Status: DC
  Administered 2016-12-29 – 2016-12-30 (×2): 20 mg via ORAL
  Filled 2016-12-29 (×2): qty 1

## 2016-12-29 MED ORDER — ACETAMINOPHEN 160 MG/5ML PO SOLN: 325.0000 mg | ORAL | Status: DC | PRN

## 2016-12-29 MED ORDER — ATORVASTATIN CALCIUM 80 MG PO TABS
80.0000 mg | ORAL_TABLET | Freq: Every day | ORAL | Status: DC
  Administered 2016-12-29: 80 mg via ORAL
  Filled 2016-12-29: qty 1

## 2016-12-29 MED ORDER — MIDAZOLAM HCL 5 MG/5ML IJ SOLN
INTRAMUSCULAR | Status: AC
  Filled 2016-12-29: qty 5

## 2016-12-29 MED ORDER — SODIUM CHLORIDE 0.9 % IR SOLN
80.0000 mg | Status: AC
  Administered 2016-12-29: 80 mg

## 2016-12-29 MED ORDER — FENTANYL CITRATE (PF) 100 MCG/2ML IJ SOLN: 25.0000 ug | INTRAMUSCULAR | Status: DC | PRN

## 2016-12-29 MED ORDER — CLOPIDOGREL BISULFATE 75 MG PO TABS
75.0000 mg | ORAL_TABLET | Freq: Every day | ORAL | Status: DC
  Administered 2016-12-29 – 2016-12-30 (×2): 75 mg via ORAL
  Filled 2016-12-29 (×2): qty 1

## 2016-12-29 MED ORDER — MUPIROCIN 2 % EX OINT
1.0000 "application " | TOPICAL_OINTMENT | Freq: Once | CUTANEOUS | Status: AC
  Administered 2016-12-29: 1 via TOPICAL

## 2016-12-29 MED ORDER — CEFAZOLIN SODIUM-DEXTROSE 2-4 GM/100ML-% IV SOLN
INTRAVENOUS | Status: AC
  Filled 2016-12-29: qty 100

## 2016-12-29 MED ORDER — FENTANYL CITRATE (PF) 100 MCG/2ML IJ SOLN
INTRAMUSCULAR | Status: DC | PRN
  Administered 2016-12-29: 25 ug via INTRAVENOUS

## 2016-12-29 MED ORDER — ONDANSETRON HCL 4 MG/2ML IJ SOLN: 4.0000 mg | Freq: Four times a day (QID) | INTRAMUSCULAR | Status: DC | PRN

## 2016-12-29 MED ORDER — HEPARIN (PORCINE) IN NACL 2-0.9 UNIT/ML-% IJ SOLN
INTRAMUSCULAR | Status: AC | PRN
  Administered 2016-12-29: 500 mL

## 2016-12-29 MED ORDER — METOPROLOL SUCCINATE ER 50 MG PO TB24
50.0000 mg | ORAL_TABLET | Freq: Every day | ORAL | Status: DC
  Administered 2016-12-29 – 2016-12-30 (×2): 50 mg via ORAL
  Filled 2016-12-29 (×2): qty 1

## 2016-12-29 MED ORDER — MIDAZOLAM HCL 5 MG/5ML IJ SOLN
INTRAMUSCULAR | Status: DC | PRN
  Administered 2016-12-29: 1 mg via INTRAVENOUS

## 2016-12-29 MED ORDER — FENTANYL CITRATE (PF) 100 MCG/2ML IJ SOLN
INTRAMUSCULAR | Status: AC
  Filled 2016-12-29: qty 2

## 2016-12-29 MED ORDER — CEFAZOLIN SODIUM-DEXTROSE 1-4 GM/50ML-% IV SOLN
1.0000 g | Freq: Four times a day (QID) | INTRAVENOUS | Status: AC
  Administered 2016-12-29 – 2016-12-30 (×3): 1 g via INTRAVENOUS
  Filled 2016-12-29 (×3): qty 50

## 2016-12-29 MED ORDER — SACUBITRIL-VALSARTAN 97-103 MG PO TABS
1.0000 | ORAL_TABLET | Freq: Two times a day (BID) | ORAL | Status: DC
  Administered 2016-12-29 – 2016-12-30 (×3): 1 via ORAL
  Filled 2016-12-29 (×3): qty 1

## 2016-12-29 MED ORDER — POTASSIUM CHLORIDE CRYS ER 20 MEQ PO TBCR
20.0000 meq | EXTENDED_RELEASE_TABLET | Freq: Every day | ORAL | Status: DC
  Administered 2016-12-29 – 2016-12-30 (×2): 20 meq via ORAL
  Filled 2016-12-29 (×2): qty 1

## 2016-12-29 MED ORDER — ONDANSETRON HCL 4 MG/2ML IJ SOLN: 4.0000 mg | Freq: Once | INTRAMUSCULAR | Status: DC | PRN

## 2016-12-29 MED ORDER — FUROSEMIDE 20 MG PO TABS: 20.0000 mg | ORAL_TABLET | Freq: Every day | ORAL | Status: DC | PRN

## 2016-12-29 MED ORDER — SODIUM CHLORIDE 0.9 % IV SOLN
INTRAVENOUS | Status: DC
  Administered 2016-12-29: 09:00:00 via INTRAVENOUS

## 2016-12-29 MED ORDER — HEPARIN (PORCINE) IN NACL 2-0.9 UNIT/ML-% IJ SOLN
INTRAMUSCULAR | Status: AC
  Filled 2016-12-29: qty 500

## 2016-12-29 MED ORDER — INDOMETHACIN 25 MG PO CAPS
25.0000 mg | ORAL_CAPSULE | Freq: Three times a day (TID) | ORAL | Status: DC | PRN
  Filled 2016-12-29: qty 1

## 2016-12-29 MED ORDER — MEPERIDINE HCL 25 MG/ML IJ SOLN: 6.2500 mg | INTRAMUSCULAR | Status: DC | PRN

## 2016-12-29 SURGICAL SUPPLY — 7 items
CABLE SURGICAL S-101-97-12 (CABLE) ×2 IMPLANT
ICD VISIA MRI VR DVFB1D4 (ICD Generator) IMPLANT
LEAD SPRINT QUAT SEC 6935M-55 (Lead) ×2 IMPLANT
PAD DEFIB LIFELINK (PAD) ×2 IMPLANT
SHEATH CLASSIC 9F (SHEATH) ×2 IMPLANT
TRAY PACEMAKER INSERTION (PACKS) ×2 IMPLANT
VISIA MRI VR DVFB1D4 (ICD Generator) ×3 IMPLANT

## 2016-12-29 NOTE — Discharge Summary (Signed)
ELECTROPHYSIOLOGY PROCEDURE DISCHARGE SUMMARY    Patient ID: Rachel Oneill,  MRN: [id number], DOB/AGE: September 24, 1950 66 y.o.  Admit date: 12/29/2016 Discharge date: 12/30/2016  Primary Care Physician: Avanell Shackleton, NP-C Primary Cardiologist: Jacinto Halim Electrophysiologist: Elberta Fortis  Primary Discharge Diagnosis:  ICM status post ICD implant this admission  Secondary Discharge Diagnosis:  1.  CAD 2.  Chronic systolic heart failure 3.  HTN 4.  Hyperlipidemia   Allergies  Allergen Reactions  . Shellfish Allergy Itching     Procedures This Admission:  1.  Implantation of a MDT single chamber ICD on 12/29/16 by Dr Elberta Fortis.  The patient received a MDT model number Visia AF ICD with model number 6935 right ventricular lead.  DFT's were deferred at time of implant.  There were no immediate post procedure complications. 2.  CXR on 12/30/16 demonstrated no pneumothorax status post device implantation.   Brief HPI: Rachel Oneill is a 66 y.o. female was referred to electrophysiology in the outpatient setting for consideration of ICD implantation.  Past medical history includes CAD, ICM, chronic systolic heart failure.  The patient has persistent LV dysfunction despite guideline directed therapy.  Risks, benefits, and alternatives to ICD implantation were reviewed with the patient who wished to proceed.   Hospital Course:  The patient was admitted and underwent implantation of a MDT single chamber ICD with details as outlined above. She was monitored on telemetry overnight which demonstrated sinus rhythm.  Left chest was without hematoma or ecchymosis.  The device was interrogated and found to be functioning normally.  CXR was obtained and demonstrated no pneumothorax status post device implantation.  Wound care, arm mobility, and restrictions were reviewed with the patient.  The patient was examined and considered stable for discharge to home.   The patient's discharge medications include an  ARB (Entresto) and beta blocker (Metoprolol).   Physical Exam: Vitals:   12/29/16 1400 12/29/16 1430 12/29/16 2122 12/30/16 0514  BP: (!) 148/82 140/81 129/79 (!) 159/83  Pulse:   85 89  Resp:   18 18  Temp:   98.4 F (36.9 C) 98.2 F (36.8 C)  TempSrc:   Oral Oral  SpO2:   95% 94%  Weight:      Height:        GEN- The patient is well appearing, alert and oriented x 3 today.   HEENT: normocephalic, atraumatic; sclera clear, conjunctiva pink; hearing intact; oropharynx clear; neck supple  Lungs- Clear to ausculation bilaterally, normal work of breathing.  No wheezes, rales, rhonchi Heart- Regular rate and rhythm, no murmurs, rubs or gallops  GI- soft, non-tender, non-distended, bowel sounds present  Extremities- no clubbing, cyanosis, or edema  MS- no significant deformity or atrophy Skin- warm and dry, no rash or lesion, left chest without hematoma/ecchymosis Psych- euthymic mood, full affect Neuro- strength and sensation are intact   Labs:   Lab Results  Component Value Date   WBC 5.5 12/15/2016   HGB 13.3 12/08/2016   HCT 35.5 12/15/2016   MCV 101 (H) 12/15/2016   PLT 181 12/15/2016   No results for input(s): NA, K, CL, CO2, BUN, CREATININE, CALCIUM, PROT, BILITOT, ALKPHOS, ALT, AST, GLUCOSE in the last 168 hours.  Invalid input(s): LABALBU  Discharge Medications:  Allergies as of 12/30/2016      Reactions   Shellfish Allergy Itching      Medication List    TAKE these medications   albuterol 108 (90 Base) MCG/ACT inhaler Commonly known as:  PROVENTIL HFA;VENTOLIN HFA Inhale 1 puff into the lungs every 6 (six) hours as needed for wheezing or shortness of breath.   aspirin 81 MG chewable tablet Chew 1 tablet (81 mg total) by mouth daily.   atorvastatin 80 MG tablet Commonly known as:  LIPITOR Take 1 tablet (80 mg total) by mouth daily.   clopidogrel 75 MG tablet Commonly known as:  PLAVIX Take 75 mg by mouth daily.   ENTRESTO 97-103 MG Generic drug:   sacubitril-valsartan Take 1 tablet by mouth 2 (two) times daily.   furosemide 20 MG tablet Commonly known as:  LASIX Take 20 mg by mouth daily as needed for fluid.   indomethacin 25 MG capsule Commonly known as:  INDOCIN Take 25 mg by mouth 3 (three) times daily as needed.   metFORMIN 500 MG tablet Commonly known as:  GLUCOPHAGE Take 1 tablet (500 mg total) by mouth 2 (two) times daily with a meal. Reported on 02/13/2016   metoprolol succinate 50 MG 24 hr tablet Commonly known as:  TOPROL-XL Take 50 mg by mouth daily. Take with or immediately following a meal.   oxyCODONE-acetaminophen 5-325 MG tablet Commonly known as:  ROXICET Take 1 tablet by mouth every 6 (six) hours as needed for severe pain.   potassium chloride SA 20 MEQ tablet Commonly known as:  K-DUR,KLOR-CON Take 1 tablet (20 mEq total) when you take Lasix   torsemide 20 MG tablet Commonly known as:  DEMADEX Take 20 mg by mouth daily.       Disposition:  Discharge Instructions    Diet - low sodium heart healthy    Complete by:  As directed    Increase activity slowly    Complete by:  As directed      Follow-up Information    [facility] Follow up on 01/11/2017.   Specialty:  Cardiology Why:  at 11:30AM for wound check  Contact information: [address], [address] [place] [address] [phone]       Regan Lemming, MD Follow up on 04/05/2017.   Specialty:  Cardiology Why:  at 2:30PM  Contact information: [address] [place] [address] [phone]           Duration of Discharge Encounter: Greater than 30 minutes including physician time.  Signed, Gypsy Balsam, NP 12/30/2016 6:55 AM  I have seen and examined this patient with Gypsy Balsam.  Agree with above, note added to reflect my findings.  On exam, RRR, no murmurs, lungs clear. Single chamber ICD implanted for ischemic cardiomyopathy. Patient without major complaints  morning. Interrogation and chest x-ray without major abnormality. Plan for discharge today with follow-up in device clinic.    Vangie Henthorn M. Breya Cass MD 12/30/2016 9:01 AM

## 2016-12-29 NOTE — H&P (Signed)
Rachel Oneill, DOB [birth year], MRN [id number]  PCP:  Hetty Blend, NP-C    Cardiologist:  Jacinto Halim Primary Electrophysiologist:  Regan Lemming, MD           Chief Complaint  Patient presents with  . New Patient (Initial Visit)    Chronic systolic CHF     History of Present Illness: Rachel Oneill is a 66 y.o. female who is being seen today for the evaluation of CHF at the request of Henson, Vickie L, NP-C. Presenting today for electrophysiology evaluation.   Per review of prior records: She has a history of hypertension, hyperlipidemia, coronary disease status post CABG in 2007 with a vein to the OM1, vein to the PDA, mitral valve ring replacement due to severe MR due to mitral valve prolapse. Previous history of coronary stenting to the RCA in 2006 into the circumflex in 2006 and 2004. History of peripheral arterial disease with bilateral iliac artery stenting in 2009 and a small AAA. Previous history of cigarette smoking but quit in 2007. 2017, developed worsening lower extremity edema markedly dyspnea on exertion and was admitted for an an NSTEMI. Underwent angioplasty to the LAD with extract GEN of thrombectomy and staged PCI to the proximal circumflex bypass with a drug-eluting stent.    Today, denies symptoms of palpitations, chest pain, shortness of breath, orthopnea, PND, lower extremity edema, claudication, dizziness, presyncope, syncope, bleeding, or neurologic sequela. The patient is tolerating medications without difficulties and is otherwise without complaint today.         Past Medical History:  Diagnosis Date  . Bronchitis   . CHF (congestive heart failure) (HCC)   . Hypertension   . Uncontrolled diabetes mellitus with complications [facility])         Past Surgical History:  Procedure Laterality Date  . CARDIAC SURGERY     double bypass 2007           Current Outpatient Prescriptions  Medication Sig Dispense Refill  . albuterol  (PROVENTIL HFA;VENTOLIN HFA) 108 (90 Base) MCG/ACT inhaler Inhale 1 puff into the lungs every 6 (six) hours as needed for wheezing or shortness of breath.    Marland Kitchen aspirin 81 MG chewable tablet Chew 1 tablet (81 mg total) by mouth daily. 60 tablet 2  . atorvastatin (LIPITOR) 80 MG tablet Take 1 tablet (80 mg total) by mouth daily. 90 tablet 2  . ENTRESTO 97-103 MG Take 1 tablet by mouth 2 (two) times daily.     . indomethacin (INDOCIN) 25 MG capsule Take 25 mg by mouth 3 (three) times daily as needed.    . metFORMIN (GLUCOPHAGE) 500 MG tablet Take 1 tablet (500 mg total) by mouth 2 (two) times daily with a meal. Reported on 02/13/2016 60 tablet 5  . metoprolol succinate (TOPROL-XL) 50 MG 24 hr tablet Take 50 mg by mouth daily. Take with or immediately following a meal.    . potassium chloride SA (K-DUR,KLOR-CON) 20 MEQ tablet Take 1 tablet (20 mEq total) when you take Lasix 30 tablet 2  . prasugrel (EFFIENT) 10 MG TABS tablet Take 10 mg by mouth daily.    Marland Kitchen spironolactone (ALDACTONE) 25 MG tablet Take 25 mg by mouth daily.     Marland Kitchen torsemide (DEMADEX) 20 MG tablet Take 20 mg by mouth daily.     No current facility-administered medications for this visit.     Allergies:   Shellfish allergy   Social History:  The patient  reports that she quit  smoking about 10 years ago. Her smoking use included Cigarettes. She has never used smokeless tobacco. She reports that she does not drink alcohol or use drugs.   Family History:  The patient's family history includes Cancer in her father and mother; Diabetes in her mother.   PHYSICAL EXAM: VS:  There were no vitals filed for this visit. GEN: Well nourished, well developed, in no acute distress  HEENT: normal  Neck: no JVD, carotid bruits, or masses Cardiac: RRR; no murmurs, rubs, or gallops,no edema  Respiratory:  clear to auscultation bilaterally, normal work of breathing GI: soft, nontender, nondistended, + BS MS: no deformity or  atrophy  Skin: warm and dry Neuro:  Strength and sensation are intact Psych: euthymic mood, full affect   Lipid Panel  Labs(Brief)          Component Value Date/Time   CHOL 140 08/06/2016 0930   TRIG 85 08/06/2016 0930   HDL 40 (L) 08/06/2016 0930   CHOLHDL 3.5 08/06/2016 0930   VLDL 17 08/06/2016 0930   LDLCALC 83 08/06/2016 0930          Wt Readings from Last 3 Encounters:  11/26/16 133 lb (60.3 kg)  11/25/16 131 lb (59.4 kg)  11/03/16 131 lb (59.4 kg)      Other studies Reviewed: Additional studies/ records that were reviewed today include: TTE 09/29/16 Review of the above records today demonstrates:  LV cavity normal size. Grade 2 diastolic dysfunction. Severe diffuse hypokinesis of the inferior inferolateral and inferoseptal regions. EF 15% Left atrium severely dilated. Mild calcification of the mitral valve annulus. Mitral valve ring noted. No mitral stenosis, mild to moderate mitral regurgitation. Moderate tricuspid regurgitation. Pulmonary artery systolic pressure 52 mmHg.   Myoview, 11/18/15-personally reviewed LV moderately dilated with both rest and stress images. Ejection fraction 16%. Large scar involving the entire lateral wall from the base to the apex. No significant peri-infarct ischemia.  ASSESSMENT AND PLAN:  1.  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 Left atrium severely dilated. Plan for ICD today. Risks and benefits discussed. Risks include but not limited to bleeding, infection, tamponade, pneumothorax. She understands the risks and has agreed to the procedure.  2. Coronary artery disease. No ischemia seen on a recent Myoview in 2017. Continue current management.  3. Hyperlipidemia: Was recently switched to Crestor. Continue current management.  4. Hypertension: Blood pressure is well controlled today. No changes necessary.  Signed, Donnamae Muilenburg Jorja Loa, MD  11/26/2016  2:53 PM     ICD Criteria  Current LVEF:15-20%. Within 12 months prior to implant: Yes   Heart failure history: Yes, Class II  Cardiomyopathy history: No.  Atrial Fibrillation/Atrial Flutter: No.  Ventricular tachycardia history: No.  Cardiac arrest history: No.  History of syndromes with risk of sudden death: No.  Previous ICD: No.  Current ICD indication: Primary  PPM indication: No.   Class I or II Bradycardia indication present: No  Beta Blocker therapy for 3 or more months: Yes, prescribed.   Ace Inhibitor/ARB therapy for 3 or more months: Yes, prescribed.

## 2016-12-29 NOTE — Discharge Instructions (Signed)
° ° °  Supplemental Discharge Instructions for  °Pacemaker/Defibrillator Patients ° °Activity °No heavy lifting or vigorous activity with your left/right arm for 6 to 8 weeks.  Do not raise your left/right arm above your head for one week.  Gradually raise your affected arm as drawn below. ° °        ° °__          01/02/17                  01/03/17                      01/04/17                   01/05/17 ° °NO DRIVING for    1 week ; you may begin driving on  01/05/17   . ° °WOUND CARE °- Keep the wound area clean and dry.  Do not get this area wet for one week. No showers for one week; you may shower on  01/05/17   . °- The tape/steri-strips on your wound will fall off; do not pull them off.  No bandage is needed on the site.  DO  NOT apply any creams, oils, or ointments to the wound area. °- If you notice any drainage or discharge from the wound, any swelling or bruising at the site, or you develop a fever > 101? F after you are discharged home, call the office at once. ° °Special Instructions °- You are still able to use cellular telephones; use the ear opposite the side where you have your pacemaker/defibrillator.  Avoid carrying your cellular phone near your device. °- When traveling through airports, show security personnel your identification card to avoid being screened in the metal detectors.  Ask the security personnel to use the hand wand. °- Avoid arc welding equipment, MRI testing (magnetic resonance imaging), TENS units (transcutaneous nerve stimulators).  Call the office for questions about other devices. °- Avoid electrical appliances that are in poor condition or are not properly grounded. °- Microwave ovens are safe to be near or to operate. ° °Additional information for defibrillator patients should your device go off: °- If your device goes off ONCE and you feel fine afterward, notify the device clinic nurses. °- If your device goes off ONCE and you do not feel well afterward, call 911. °- If your  device goes off TWICE, call 911. °- If your device goes off THREE times in one day, call 911. ° °DO NOT DRIVE YOURSELF OR A FAMILY MEMBER °WITH A DEFIBRILLATOR TO THE HOSPITAL--CALL 911. ° °

## 2016-12-30 ENCOUNTER — Ambulatory Visit (HOSPITAL_COMMUNITY): Payer: Medicare Other

## 2016-12-30 DIAGNOSIS — Z7982 Long term (current) use of aspirin: Secondary | ICD-10-CM | POA: Diagnosis not present

## 2016-12-30 DIAGNOSIS — I5042 Chronic combined systolic (congestive) and diastolic (congestive) heart failure: Secondary | ICD-10-CM | POA: Diagnosis not present

## 2016-12-30 DIAGNOSIS — I517 Cardiomegaly: Secondary | ICD-10-CM | POA: Diagnosis not present

## 2016-12-30 DIAGNOSIS — I2510 Atherosclerotic heart disease of native coronary artery without angina pectoris: Secondary | ICD-10-CM | POA: Diagnosis not present

## 2016-12-30 DIAGNOSIS — E785 Hyperlipidemia, unspecified: Secondary | ICD-10-CM | POA: Diagnosis not present

## 2016-12-30 DIAGNOSIS — I110 Hypertensive heart disease with heart failure: Secondary | ICD-10-CM | POA: Diagnosis not present

## 2016-12-30 DIAGNOSIS — Z87891 Personal history of nicotine dependence: Secondary | ICD-10-CM | POA: Diagnosis not present

## 2016-12-30 DIAGNOSIS — Z006 Encounter for examination for normal comparison and control in clinical research program: Secondary | ICD-10-CM | POA: Diagnosis not present

## 2016-12-30 DIAGNOSIS — Z91013 Allergy to seafood: Secondary | ICD-10-CM | POA: Diagnosis not present

## 2016-12-30 DIAGNOSIS — Z7984 Long term (current) use of oral hypoglycemic drugs: Secondary | ICD-10-CM | POA: Diagnosis not present

## 2016-12-30 DIAGNOSIS — Z951 Presence of aortocoronary bypass graft: Secondary | ICD-10-CM | POA: Diagnosis not present

## 2016-12-30 DIAGNOSIS — I255 Ischemic cardiomyopathy: Secondary | ICD-10-CM | POA: Diagnosis not present

## 2016-12-30 LAB — GLUCOSE, CAPILLARY: Glucose-Capillary: 179 mg/dL — ABNORMAL HIGH (ref 65–99)

## 2016-12-30 NOTE — Progress Notes (Signed)
12/30/2016 10:38 AM Discharge AVS meds taken today and those due this evening reviewed.  Follow-up appointments and when to call md reviewed.  D/C IV and TELE.  Questions and concerns addressed.   D/C home per orders. Kathryne Hitch

## 2017-01-05 DIAGNOSIS — Z950 Presence of cardiac pacemaker: Secondary | ICD-10-CM | POA: Diagnosis not present

## 2017-01-07 ENCOUNTER — Ambulatory Visit (INDEPENDENT_AMBULATORY_CARE_PROVIDER_SITE_OTHER): Payer: Self-pay | Admitting: Vascular Surgery

## 2017-01-07 ENCOUNTER — Encounter: Payer: Self-pay | Admitting: Vascular Surgery

## 2017-01-07 ENCOUNTER — Encounter: Payer: Medicare Other | Admitting: Vascular Surgery

## 2017-01-07 VITALS — BP 134/84 | HR 82 | Temp 97.3°F | Resp 18 | Ht 64.0 in | Wt 125.3 lb

## 2017-01-07 DIAGNOSIS — I770 Arteriovenous fistula, acquired: Secondary | ICD-10-CM

## 2017-01-07 NOTE — Progress Notes (Signed)
Patient is a 66 year old female who returns for postoperative follow-up today. She recently underwent ligation of a traumatic AV fistula of her left brachial artery. She denies any numbness or tingling in her hand. She has no incisional drainage.  Physical exam:  Vitals:   01/07/17 1504  BP: 134/84  Pulse: 82  Resp: 18  Temp: 97.3 F (36.3 C)  TempSrc: Oral  SpO2: 96%  Weight: 125 lb 4.8 oz (56.8 kg)  Height: 5\' 4"  (1.626 m)    Well-healed antecubital incision no palpable thrill 2+ left radial pulse  Assessment: Doing well status post ligation of traumatic left brachial artery AV fistula.   plan: The patient will follow-up on as-needed basis.  Fabienne Bruns, MD [facility] Office: [phone] Pager: [phone]

## 2017-01-11 ENCOUNTER — Ambulatory Visit (INDEPENDENT_AMBULATORY_CARE_PROVIDER_SITE_OTHER): Payer: Medicare Other | Admitting: *Deleted

## 2017-01-11 DIAGNOSIS — I255 Ischemic cardiomyopathy: Secondary | ICD-10-CM | POA: Diagnosis not present

## 2017-01-11 NOTE — Progress Notes (Signed)
Wound check appointment. Steri-strips removed. Wound without redness. Hematoma noted, per pt size appears to be decreasing. Informed pt that if it did not continue to reduce in size or gets larger pt to call the device clinic, pt voiced understanding.  Incision edges approximated. Normal device function. Thresholds, sensing, and impedances consistent with implant measurements. Device programmed at 3.5V for extra safety margin until 3 month visit. Histogram distribution appropriate for patient and level of activity. 15 AF episodes~ EGMS PCVs and PACs. Patient educated about wound care, arm mobility, lifting restrictions, shock plan. ROV 04/05/2017

## 2017-01-12 ENCOUNTER — Telehealth: Payer: Self-pay | Admitting: Cardiology

## 2017-01-12 NOTE — Telephone Encounter (Signed)
Patient dropped off FMLA papers on 6/4. I called this am left her a VM for her to return my call need to explain the process of having FMLA completed.

## 2017-01-15 NOTE — Addendum Note (Signed)
Addendum  created 01/15/17 1212 by Yuriko Portales, MD   Sign clinical note    

## 2017-02-12 DIAGNOSIS — Z95810 Presence of automatic (implantable) cardiac defibrillator: Secondary | ICD-10-CM | POA: Diagnosis not present

## 2017-02-12 DIAGNOSIS — I2510 Atherosclerotic heart disease of native coronary artery without angina pectoris: Secondary | ICD-10-CM | POA: Diagnosis not present

## 2017-02-12 DIAGNOSIS — Z006 Encounter for examination for normal comparison and control in clinical research program: Secondary | ICD-10-CM | POA: Diagnosis not present

## 2017-02-12 DIAGNOSIS — I5043 Acute on chronic combined systolic (congestive) and diastolic (congestive) heart failure: Secondary | ICD-10-CM | POA: Diagnosis not present

## 2017-02-23 DIAGNOSIS — I255 Ischemic cardiomyopathy: Secondary | ICD-10-CM | POA: Diagnosis not present

## 2017-02-24 ENCOUNTER — Encounter: Payer: Medicare Other | Admitting: Family Medicine

## 2017-03-08 DIAGNOSIS — Z95810 Presence of automatic (implantable) cardiac defibrillator: Secondary | ICD-10-CM | POA: Diagnosis not present

## 2017-03-08 DIAGNOSIS — I420 Dilated cardiomyopathy: Secondary | ICD-10-CM | POA: Diagnosis not present

## 2017-03-08 DIAGNOSIS — I255 Ischemic cardiomyopathy: Secondary | ICD-10-CM | POA: Diagnosis not present

## 2017-03-08 DIAGNOSIS — I5042 Chronic combined systolic (congestive) and diastolic (congestive) heart failure: Secondary | ICD-10-CM | POA: Diagnosis not present

## 2017-03-12 ENCOUNTER — Other Ambulatory Visit: Payer: Self-pay | Admitting: Family Medicine

## 2017-03-12 DIAGNOSIS — I5022 Chronic systolic (congestive) heart failure: Secondary | ICD-10-CM

## 2017-03-13 DIAGNOSIS — Z95810 Presence of automatic (implantable) cardiac defibrillator: Secondary | ICD-10-CM | POA: Diagnosis not present

## 2017-03-13 DIAGNOSIS — Z4502 Encounter for adjustment and management of automatic implantable cardiac defibrillator: Secondary | ICD-10-CM | POA: Diagnosis not present

## 2017-03-18 ENCOUNTER — Ambulatory Visit (INDEPENDENT_AMBULATORY_CARE_PROVIDER_SITE_OTHER): Payer: Medicare Other | Admitting: Family Medicine

## 2017-03-18 ENCOUNTER — Encounter: Payer: Self-pay | Admitting: Family Medicine

## 2017-03-18 VITALS — BP 120/60 | HR 74 | Wt 127.2 lb

## 2017-03-18 DIAGNOSIS — I10 Essential (primary) hypertension: Secondary | ICD-10-CM

## 2017-03-18 DIAGNOSIS — E1121 Type 2 diabetes mellitus with diabetic nephropathy: Secondary | ICD-10-CM

## 2017-03-18 DIAGNOSIS — E785 Hyperlipidemia, unspecified: Secondary | ICD-10-CM

## 2017-03-18 DIAGNOSIS — I255 Ischemic cardiomyopathy: Secondary | ICD-10-CM

## 2017-03-18 DIAGNOSIS — N183 Chronic kidney disease, stage 3 unspecified: Secondary | ICD-10-CM

## 2017-03-18 DIAGNOSIS — E559 Vitamin D deficiency, unspecified: Secondary | ICD-10-CM | POA: Diagnosis not present

## 2017-03-18 LAB — POCT GLYCOSYLATED HEMOGLOBIN (HGB A1C)

## 2017-03-18 MED ORDER — ALBUTEROL SULFATE HFA 108 (90 BASE) MCG/ACT IN AERS: 1.0000 | INHALATION_SPRAY | Freq: Four times a day (QID) | RESPIRATORY_TRACT | 1 refills | Status: DC | PRN

## 2017-03-18 NOTE — Progress Notes (Signed)
   Subjective:    Patient ID: Rachel Oneill, female    DOB: 14-Apr-1951, 66 y.o.   MRN: [id number]  HPI Chief Complaint  Patient presents with  . med check    med check, no other concerns   She is here for a med check and to discuss diabetes. Recently had pacemaker insertion and is doing well with this.  States her FBS has been in the 120s. Denies any high or low BS readings.  Taking Metformin twice daily.   States she feels well and is in good spirits. No concerns today. She works in a [employer] and states she starts back to work again soon. She looks forward to this.   Denies fever, chills, dizziness, chest pain, palpitations, shortness of breath, cough, orthopnea, GI or GU issues. No LE edema. Denies recent weight gain.   CKD- denies seeing nephrology in the past. Will need to recheck her kidney function.  Vitamin D deficiency- taking 2,000 IU every other day.  Requests refill of albuterol inhaler. States she only uses this 1 time every week or two.   States Dr. Jacinto Halim told her that her heart function has improved. He is managing her CHF and HTN.  No recent lipid panel per records. She is not fasting today.   Scheduled for colonoscopy in October.  Diabetic eye exam was at [facility] 2 months ago.   Reviewed allergies, medications, past medical, surgical, and social history.  Review of Systems Pertinent positives and negatives in the history of present illness.     Objective:   Physical Exam BP 120/60   Pulse 74   Wt 127 lb 3.2 oz (57.7 kg)   BMI 21.83 kg/m   Alert, pleasant and in no distress. Pharyngeal area is normal.  Cardiac exam shows a RRR. Lungs are clear to auscultation. Extremities without edema.       Assessment & Plan:  Type 2 diabetes with nephropathy (HCC) - Plan: HgB A1c, CBC with Differential/Platelet, COMPLETE METABOLIC PANEL WITH GFR  Essential hypertension - Plan: CBC with Differential/Platelet, COMPLETE METABOLIC PANEL WITH  GFR  Vitamin D deficiency - Plan: VITAMIN D 25 Hydroxy (Vit-D Deficiency, Fractures)  Ischemic cardiomyopathy - Plan: Lipid panel  Chronic kidney disease, stage 3, mod decreased GFR - Plan: COMPLETE METABOLIC PANEL WITH GFR  Hyperlipidemia LDL goal <100 - Plan: Lipid panel  Hemoglobin A1c 7.4% and stable from last visit. No changes to medication. Emphasized healthy diet and exercise as tolerated.  BP is at goal. Continue current medication regimen.  Discussed that due to worsening CKD, we may need to refer her to nephrology and will consider stopping metformin once we get her lab results.  Will check vitamin D level.  Up to date with diabetic eye exam. Will request these records.  She is scheduled for her colonoscopy in October.  She will return fasting tomorrow and orders are in the computer.

## 2017-03-23 ENCOUNTER — Encounter: Payer: Self-pay | Admitting: Family Medicine

## 2017-03-23 ENCOUNTER — Other Ambulatory Visit: Payer: Medicare Other

## 2017-03-23 ENCOUNTER — Telehealth: Payer: Self-pay | Admitting: Family Medicine

## 2017-03-23 DIAGNOSIS — E559 Vitamin D deficiency, unspecified: Secondary | ICD-10-CM | POA: Diagnosis not present

## 2017-03-23 DIAGNOSIS — I255 Ischemic cardiomyopathy: Secondary | ICD-10-CM

## 2017-03-23 DIAGNOSIS — N183 Chronic kidney disease, stage 3 unspecified: Secondary | ICD-10-CM

## 2017-03-23 DIAGNOSIS — I10 Essential (primary) hypertension: Secondary | ICD-10-CM

## 2017-03-23 DIAGNOSIS — E1121 Type 2 diabetes mellitus with diabetic nephropathy: Secondary | ICD-10-CM | POA: Diagnosis not present

## 2017-03-23 DIAGNOSIS — E785 Hyperlipidemia, unspecified: Secondary | ICD-10-CM | POA: Diagnosis not present

## 2017-03-23 LAB — CBC WITH DIFFERENTIAL/PLATELET
Basophils Absolute: 0 cells/uL (ref 0–200)
Basophils Relative: 0 %
Eosinophils Absolute: 98 cells/uL (ref 15–500)
Eosinophils Relative: 2 %
HEMATOCRIT: 35.7 % (ref 35.0–45.0)
Hemoglobin: 11.8 g/dL (ref 11.7–15.5)
LYMPHS ABS: 1274 {cells}/uL (ref 850–3900)
Lymphocytes Relative: 26 %
MCH: 32.5 pg (ref 27.0–33.0)
MCHC: 33.1 g/dL (ref 32.0–36.0)
MCV: 98.3 fL (ref 80.0–100.0)
MONO ABS: 539 {cells}/uL (ref 200–950)
MPV: 9.8 fL (ref 7.5–12.5)
Monocytes Relative: 11 %
Neutro Abs: 2989 cells/uL (ref 1500–7800)
Neutrophils Relative %: 61 %
Platelets: 142 10*3/uL (ref 140–400)
RBC: 3.63 MIL/uL — AB (ref 3.80–5.10)
RDW: 13.9 % (ref 11.0–15.0)
WBC: 4.9 10*3/uL (ref 4.0–10.5)

## 2017-03-23 NOTE — Telephone Encounter (Signed)
Received requested diabetic eye exam from [facility] 03/23/2017. Sending back for review.

## 2017-03-24 LAB — LIPID PANEL
CHOLESTEROL: 127 mg/dL (ref <200)
HDL: 37 mg/dL — ABNORMAL LOW (ref >50)
LDL Cholesterol: 75 mg/dL (ref <100)
Total CHOL/HDL Ratio: 3.4 Ratio (ref <5.0)
Triglycerides: 74 mg/dL (ref <150)
VLDL: 15 mg/dL (ref <30)

## 2017-03-24 LAB — COMPLETE METABOLIC PANEL WITH GFR
ALK PHOS: 112 U/L (ref 33–130)
ALT: 27 U/L (ref 6–29)
AST: 22 U/L (ref 10–35)
Albumin: 4.1 g/dL (ref 3.6–5.1)
BUN: 22 mg/dL (ref 7–25)
CHLORIDE: 111 mmol/L — AB (ref 98–110)
CO2: 20 mmol/L (ref 20–32)
CREATININE: 1.19 mg/dL — AB (ref 0.50–0.99)
Calcium: 9.4 mg/dL (ref 8.6–10.4)
GFR, Est African American: 54 mL/min — ABNORMAL LOW (ref >=60)
GFR, Est Non African American: 47 mL/min — ABNORMAL LOW (ref >=60)
Glucose, Bld: 133 mg/dL — ABNORMAL HIGH (ref 65–99)
Potassium: 4.2 mmol/L (ref 3.5–5.3)
Sodium: 143 mmol/L (ref 135–146)
Total Bilirubin: 1 mg/dL (ref 0.2–1.2)
Total Protein: 6.6 g/dL (ref 6.1–8.1)

## 2017-03-24 LAB — VITAMIN D 25 HYDROXY (VIT D DEFICIENCY, FRACTURES): Vit D, 25-Hydroxy: 26 ng/mL — ABNORMAL LOW (ref 30–100)

## 2017-04-01 ENCOUNTER — Encounter: Payer: Self-pay | Admitting: Internal Medicine

## 2017-04-05 ENCOUNTER — Ambulatory Visit (INDEPENDENT_AMBULATORY_CARE_PROVIDER_SITE_OTHER): Payer: Medicare Other | Admitting: Cardiology

## 2017-04-05 ENCOUNTER — Encounter: Payer: Self-pay | Admitting: *Deleted

## 2017-04-05 ENCOUNTER — Encounter: Payer: Self-pay | Admitting: Cardiology

## 2017-04-05 VITALS — BP 100/64 | HR 104 | Ht 64.0 in | Wt 127.8 lb

## 2017-04-05 DIAGNOSIS — E782 Mixed hyperlipidemia: Secondary | ICD-10-CM | POA: Diagnosis not present

## 2017-04-05 DIAGNOSIS — I10 Essential (primary) hypertension: Secondary | ICD-10-CM

## 2017-04-05 DIAGNOSIS — I255 Ischemic cardiomyopathy: Secondary | ICD-10-CM | POA: Diagnosis not present

## 2017-04-05 DIAGNOSIS — I5022 Chronic systolic (congestive) heart failure: Secondary | ICD-10-CM | POA: Diagnosis not present

## 2017-04-05 DIAGNOSIS — I2589 Other forms of chronic ischemic heart disease: Secondary | ICD-10-CM

## 2017-04-05 LAB — CUP PACEART INCLINIC DEVICE CHECK
Battery Remaining Longevity: 136 mo
Battery Voltage: 3.14 V
Brady Statistic RV Percent Paced: 0.79 %
Date Time Interrogation Session: 20180827153517
HIGH POWER IMPEDANCE MEASURED VALUE: 50 Ohm
Implantable Pulse Generator Implant Date: 20180522
Lead Channel Impedance Value: 361 Ohm
Lead Channel Impedance Value: 418 Ohm
Lead Channel Sensing Intrinsic Amplitude: 10.625 mV
Lead Channel Setting Pacing Amplitude: 2.5 V
Lead Channel Setting Pacing Pulse Width: 0.4 ms
MDC IDC LEAD IMPLANT DT: 20180522
MDC IDC LEAD LOCATION: 753860
MDC IDC MSMT LEADCHNL RV PACING THRESHOLD AMPLITUDE: 0.5 V
MDC IDC MSMT LEADCHNL RV PACING THRESHOLD PULSEWIDTH: 0.4 ms
MDC IDC MSMT LEADCHNL RV SENSING INTR AMPL: 9.5 mV
MDC IDC SET LEADCHNL RV SENSING SENSITIVITY: 0.3 mV

## 2017-04-05 NOTE — Progress Notes (Signed)
Electrophysiology Office Note   Date:  04/05/2017   ID:  Rachel Oneill, Rachel Oneill 12-29-1950, MRN [id number]  PCP:  Avanell Shackleton, NP-C  Cardiologist:  Jacinto Halim Primary Electrophysiologist:  Will Jorja Loa, MD    Chief Complaint  Patient presents with  . Defib Check    Ischemic cardiomyopathy/Chronic systolic HF     History of Present Illness: Rachel Oneill is a 66 y.o. female who is being seen today for the evaluation of CHF at the request of Henson, Vickie L, NP-C. Presenting today for electrophysiology evaluation.   Per review of prior records: She has a history of hypertension, hyperlipidemia, coronary disease status post CABG in 2007 with a vein to the OM1, vein to the PDA, mitral valve ring replacement due to severe MR due to mitral valve prolapse. Previous history of coronary stenting to the RCA in 2006 into the circumflex in 2006 and 2004. History of peripheral arterial disease with bilateral iliac artery stenting in 2009 and a small AAA. Previous history of cigarette smoking but quit in 2007. 2017, developed worsening lower extremity edema markedly dyspnea on exertion and was admitted for an an NSTEMI. Underwent angioplasty to the LAD with extract GEN of thrombectomy and staged PCI to the proximal circumflex bypass with a drug-eluting stent. Medtronic single chamber ICD implanted 12/29/16.   Today, denies symptoms of palpitations, chest pain, shortness of breath, orthopnea, PND, lower extremity edema, claudication, dizziness, presyncope, syncope, bleeding, or neurologic sequela. She does have occasional episodes of fatigue. She is recently started work again in [employer]. She was asked to mop large area for which caused her significant fatigue and shortness of breath. Otherwise she is been doing well. She has not had many heart failure symptoms other than when she overexerts herself.  Past Medical History:  Diagnosis Date  . Bronchitis   . CHF (congestive heart  failure) (HCC)   . Hypertension   . Pneumonia    2008ish  . 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 CARDIAC SURGERY     double bypass 2007  . CORONARY ARTERY BYPASS GRAFT    . ICD IMPLANT N/A 12/29/2016   Procedure: ICD Implant;  Surgeon: Regan Lemming, MD;  Location: [facility] INVASIVE CV LAB;  Service: Cardiovascular;  Laterality: N/A;  . LIGATION OF ARTERIOVENOUS  FISTULA Left 12/08/2016   Procedure: LIGATION OF Left arm ARTERIOVENOUS  FISTULA;  Surgeon: Sherren Kerns, MD;  Location: [facility] OR;  Service: Vascular;  Laterality: Left;     Current Outpatient Prescriptions  Medication Sig Dispense Refill  . albuterol (PROVENTIL HFA;VENTOLIN HFA) 108 (90 Base) MCG/ACT inhaler Inhale 1 puff into the lungs every 6 (six) hours as needed for wheezing or shortness of breath. 1 Inhaler 1  . aspirin 81 MG chewable tablet Chew 1 tablet (81 mg total) by mouth daily. 60 tablet 2  . atorvastatin (LIPITOR) 80 MG tablet Take 1 tablet (80 mg total) by mouth daily. 90 tablet 2  . clopidogrel (PLAVIX) 75 MG tablet Take 75 mg by mouth daily.    Marland Kitchen ENTRESTO 97-103 MG Take 1 tablet by mouth 2 (two) times daily.     . furosemide (LASIX) 20 MG tablet Take 20 mg by mouth daily as needed for fluid.    . ivabradine (CORLANOR) 5 MG TABS tablet Take 5 mg by mouth 2 (two) times daily with a meal.    .  KLOR-CON M20 20 MEQ tablet TAKE ONE TABLET BY MOUTH WHEN YOU TAKE LASIX 30 tablet 2  . metFORMIN (GLUCOPHAGE) 500 MG tablet Take 1 tablet (500 mg total) by mouth 2 (two) times daily with a meal. Reported on 02/13/2016 60 tablet 5  . metoprolol succinate (TOPROL-XL) 50 MG 24 hr tablet Take 50 mg by mouth daily. Take with or immediately following a meal.    . torsemide (DEMADEX) 20 MG tablet Take 20 mg by mouth daily.    Marland Kitchen VITAMIN D, CHOLECALCIFEROL, PO Take 2,000 Units by mouth every other day.     No current facility-administered  medications for this visit.     Allergies:   Shellfish allergy   Social History:  The patient  reports that she quit smoking about 10 years ago. Her smoking use included Cigarettes. She quit after 30.00 years of use. She has never used smokeless tobacco. She reports that she does not drink alcohol or use drugs.   Family History:  The patient's family history includes Cancer in her father and mother; Diabetes in her mother.    ROS:  Please see the history of present illness.   Otherwise, review of systems is positive for none.   All other systems are reviewed and negative.   PHYSICAL EXAM: VS:  BP 100/64   Pulse (!) 104   Ht 5\' 4"  (1.626 m)   Wt 127 lb 12.8 oz (58 kg)   BMI 21.94 kg/m  , BMI Body mass index is 21.94 kg/m. GEN: Well nourished, well developed, in no acute distress  HEENT: normal  Neck: no JVD, carotid bruits, or masses Cardiac: RRR; no murmurs, rubs, or gallops,no edema  Respiratory:  clear to auscultation bilaterally, normal work of breathing GI: soft, nontender, nondistended, + BS MS: no deformity or atrophy  Skin: warm and dry, device site well healed Neuro:  Strength and sensation are intact Psych: euthymic mood, full affect  EKG:  EKG is ordered today. Personal review of the ekg ordered shows ST, IMI, rate 104  Personal review of the device interrogation today. Results in [facility]   Recent Labs: 05/31/2016: B Natriuretic Peptide 2,873.6 08/06/2016: TSH 3.38 03/23/2017: ALT 27; BUN 22; Creat 1.19; Hemoglobin 11.8; Platelets 142; Potassium 4.2; Sodium 143    Lipid Panel     Component Value Date/Time   CHOL 127 03/23/2017 0731   TRIG 74 03/23/2017 0731   HDL 37 (L) 03/23/2017 0731   CHOLHDL 3.4 03/23/2017 0731   VLDL 15 03/23/2017 0731   LDLCALC 75 03/23/2017 0731     Wt Readings from Last 3 Encounters:  04/05/17 127 lb 12.8 oz (58 kg)  03/18/17 127 lb 3.2 oz (57.7 kg)  01/07/17 125 lb 4.8 oz (56.8 kg)      Other studies  Reviewed: Additional studies/ records that were reviewed today include: TTE 09/29/16 Review of the above records today demonstrates:  LV cavity normal size. Grade 2 diastolic dysfunction. Severe diffuse hypokinesis of the inferior inferolateral and inferoseptal regions. EF 15% Left atrium severely dilated. Mild calcification of the mitral valve annulus. Mitral valve ring noted. No mitral stenosis, mild to moderate mitral regurgitation. Moderate tricuspid regurgitation. Pulmonary artery systolic pressure 52 mmHg.   Myoview, 11/18/15-personally reviewed LV moderately dilated with both rest and stress images. Ejection fraction 16%. Large scar involving the entire lateral wall from the base to the apex. No significant peri-infarct ischemia.  ASSESSMENT AND PLAN:  1.  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 Tolerating well. No changes. She has had quite a bit of ectopy on her device, some of which has lasted up to 3 hours. At this point it is unclear if this is due to atrial fibrillation or simply atrial ectopy. At this point, we will not anticoagulate.  2. Coronary artery disease. No current chest pain. Continue current management.  3. Hyperlipidemia: Continue Crestor  4. Hypertension: Blood pressure well controlled today.  Current medicines are reviewed at length with the patient today.   The patient does not have concerns regarding her medicines.  The following changes were made today:  noen  Labs/ tests ordered today include:  Orders Placed This Encounter  Procedures  . EKG 12-Lead     Disposition:   FU with Will Camnitz PRN  Signed, Will Jorja Loa, MD  04/05/2017 2:46 PM     [facility] [address] [place] [address] [phone] (office) [phone] (fax)

## 2017-04-05 NOTE — Patient Instructions (Signed)
Medication Instructions:  Your physician recommends that you continue on your current medications as directed. Please refer to the Current Medication list given to you today.  If you need a refill on your cardiac medications before your next appointment, please call your pharmacy.   Labwork: None ordered  Testing/Procedures: None ordered  Follow-Up: No follow up is needed at this time with Dr. Elberta Fortis.  He will see you on an as needed basis.  Dr. Jacinto Halim will continue to monitor you  Thank you for choosing [facility]!!   Dory Horn, RN [phone]  Any Other Special Instructions Will Be Listed Below (If Applicable).

## 2017-04-13 ENCOUNTER — Other Ambulatory Visit: Payer: Self-pay | Admitting: Family Medicine

## 2017-04-13 DIAGNOSIS — E119 Type 2 diabetes mellitus without complications: Secondary | ICD-10-CM

## 2017-05-03 DIAGNOSIS — I5042 Chronic combined systolic (congestive) and diastolic (congestive) heart failure: Secondary | ICD-10-CM | POA: Diagnosis not present

## 2017-05-03 DIAGNOSIS — Z4502 Encounter for adjustment and management of automatic implantable cardiac defibrillator: Secondary | ICD-10-CM | POA: Diagnosis not present

## 2017-05-03 DIAGNOSIS — I420 Dilated cardiomyopathy: Secondary | ICD-10-CM | POA: Diagnosis not present

## 2017-05-03 DIAGNOSIS — Z95810 Presence of automatic (implantable) cardiac defibrillator: Secondary | ICD-10-CM | POA: Diagnosis not present

## 2017-05-03 DIAGNOSIS — I255 Ischemic cardiomyopathy: Secondary | ICD-10-CM | POA: Diagnosis not present

## 2017-05-11 ENCOUNTER — Encounter (INDEPENDENT_AMBULATORY_CARE_PROVIDER_SITE_OTHER): Payer: Self-pay

## 2017-05-11 ENCOUNTER — Encounter: Payer: Self-pay | Admitting: Gastroenterology

## 2017-05-11 ENCOUNTER — Ambulatory Visit (INDEPENDENT_AMBULATORY_CARE_PROVIDER_SITE_OTHER): Payer: Medicare Other | Admitting: Gastroenterology

## 2017-05-11 VITALS — BP 102/54 | HR 64 | Ht 65.35 in | Wt 129.4 lb

## 2017-05-11 DIAGNOSIS — I255 Ischemic cardiomyopathy: Secondary | ICD-10-CM | POA: Diagnosis not present

## 2017-05-11 DIAGNOSIS — Z1211 Encounter for screening for malignant neoplasm of colon: Secondary | ICD-10-CM

## 2017-05-11 DIAGNOSIS — Z1212 Encounter for screening for malignant neoplasm of rectum: Secondary | ICD-10-CM

## 2017-05-11 NOTE — Progress Notes (Signed)
HPI: This is a very pleasant 66 year old woman whom I'm meeting for the first time today   January visit  Here in our office, she met with Anderson Malta and they discussed colon cancer screening. She was on Plavix at the time and we discussed colonoscopy with her, she would need to hold her Plavix for days prior to the procedure. We communicated with her cardiologist, and he recommended against holding it since she had relatively recent coronary stents, October 2017.  She is off Plavix now however she had had internal defibrillator placed this past May   Chief complaint is routine risk for colon cancer  ROS: complete GI ROS as described in HPI, all other review negative.  Constitutional:  No unintentional weight loss   Past Medical History:  Diagnosis Date  . Bronchitis   . CHF (congestive heart failure) ([place])   . Hypertension   . Pneumonia    2008ish  . Uncontrolled diabetes mellitus with complications ([place])    Type II    Past Surgical History:  Procedure Laterality Date  . CARDIAC SURGERY     double bypass 2007  . CORONARY ARTERY BYPASS GRAFT    . ICD IMPLANT N/A 12/29/2016   Procedure: ICD Implant;  Surgeon: Constance Haw, MD;  Location: [place] CV LAB;  Service: Cardiovascular;  Laterality: N/A;  . LIGATION OF ARTERIOVENOUS  FISTULA Left 12/08/2016   Procedure: LIGATION OF Left arm ARTERIOVENOUS  FISTULA;  Surgeon: Elam Dutch, MD;  Location: [facility] OR;  Service: Vascular;  Laterality: Left;    Current Outpatient Prescriptions  Medication Sig Dispense Refill  . albuterol (PROVENTIL HFA;VENTOLIN HFA) 108 (90 Base) MCG/ACT inhaler Inhale 1 puff into the lungs every 6 (six) hours as needed for wheezing or shortness of breath. 1 Inhaler 1  . aspirin 81 MG chewable tablet Chew 1 tablet (81 mg total) by mouth daily. 60 tablet 2  . atorvastatin (LIPITOR) 80 MG tablet Take 1 tablet (80 mg total) by mouth daily. 90 tablet 2  . ENTRESTO 97-103 MG Take 1 tablet by mouth 2 (two)  times daily.     . furosemide (LASIX) 20 MG tablet Take 20 mg by mouth daily as needed for fluid.    . ivabradine (CORLANOR) 5 MG TABS tablet Take 5 mg by mouth 2 (two) times daily with a meal.    . KLOR-CON M20 20 MEQ tablet TAKE ONE TABLET BY MOUTH WHEN YOU TAKE LASIX 30 tablet 2  . metFORMIN (GLUCOPHAGE) 500 MG tablet TAKE ONE TABLET BY MOUTH TWICE DAILY WITH A MEAL 180 tablet 1  . metoprolol succinate (TOPROL-XL) 50 MG 24 hr tablet Take 50 mg by mouth daily. Take with or immediately following a meal.    . torsemide (DEMADEX) 20 MG tablet Take 20 mg by mouth daily.    Marland Kitchen VITAMIN D, CHOLECALCIFEROL, PO Take 2,000 Units by mouth every other day.     No current facility-administered medications for this visit.     Allergies as of 05/11/2017 - Review Complete 05/11/2017  Allergen Reaction Noted  . Shellfish allergy Itching 09/01/2012    Family History  Problem Relation Age of Onset  . Diabetes Mother   . Brain cancer Mother   . Cancer Father        Neck  . Colon cancer Neg Hx   . Stomach cancer Neg Hx     Social History   Social History  . Marital status: Legally Separated    Spouse name: N/A  .  Number of children: 5  . Years of education: N/A   Occupational History  . 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 Smoking status: Former Smoker    Years: 30.00    Types: Cigarettes    Quit date: 05/31/2006  . Smokeless tobacco: Never Used  . Alcohol use No  . Drug use: No  . Sexual activity: Not on file   Other Topics Concern  . Not on file   Social History Narrative  . No narrative on file     Physical Exam: Ht 5' 5.35" (1.66 m) Comment: height measured without shoes  Wt 129 lb 6 oz (58.7 kg)   BMI 21.30 kg/m  Constitutional: generally well-appearing Psychiatric: alert and oriented x3 Abdomen: soft, nontender, nondistended, no obvious ascites, no peritoneal signs, normal bowel sounds No peripheral edema noted in lower  extremities  Assessment and plan: 66 y.o. female with Routine risk for colon cancer we discussed colon cancer screening options. I explained to her that she is probably at increased risk  for procedural complications during a colonoscopy given her congestive heart failure and the fact that she has an internal defibrillator.   We discussed other options for colon cancer screening and in the end we decided to arrange her to have a Colo guard stool testing. She knows that this is positive then she would need colonoscopy as follow-up. If it is negative then she would not need repeat colon cancer screening for 3 years.  please see the "Patient Instructions" section for addition details about the plan.  Owens Loffler, MD [facility] 05/11/2017, 3:33 PM

## 2017-05-11 NOTE — Patient Instructions (Addendum)
Cologuard stool test. You will receive a call from Cologuard, if you do not hear from that office please call me in 1 week.  [phone] ask for Patty  IF this is negative, you will need repeat screening in 3 years.  If it is positive, you will need a colonoscopy.  Normal BMI (Body Mass Index- based on height and weight) is between 23 and 30. Your BMI today is Body mass index is 21.3 kg/m. Marland Kitchen Please consider follow up  regarding your BMI with your Primary Care Provider.

## 2017-06-03 DIAGNOSIS — Z95810 Presence of automatic (implantable) cardiac defibrillator: Secondary | ICD-10-CM | POA: Diagnosis not present

## 2017-06-03 DIAGNOSIS — I255 Ischemic cardiomyopathy: Secondary | ICD-10-CM | POA: Diagnosis not present

## 2017-06-03 DIAGNOSIS — Z4502 Encounter for adjustment and management of automatic implantable cardiac defibrillator: Secondary | ICD-10-CM | POA: Diagnosis not present

## 2017-06-04 DIAGNOSIS — Z4502 Encounter for adjustment and management of automatic implantable cardiac defibrillator: Secondary | ICD-10-CM | POA: Diagnosis not present

## 2017-06-04 DIAGNOSIS — Z95810 Presence of automatic (implantable) cardiac defibrillator: Secondary | ICD-10-CM | POA: Diagnosis not present

## 2017-06-04 DIAGNOSIS — I255 Ischemic cardiomyopathy: Secondary | ICD-10-CM | POA: Diagnosis not present

## 2017-06-07 ENCOUNTER — Telehealth: Payer: Self-pay | Admitting: Internal Medicine

## 2017-06-07 NOTE — Telephone Encounter (Signed)
Called and left message for pt to call me back  Trying to find out if she is taking her metformin regularly as prescribed

## 2017-06-25 ENCOUNTER — Encounter: Payer: Self-pay | Admitting: Family Medicine

## 2017-06-25 ENCOUNTER — Ambulatory Visit (INDEPENDENT_AMBULATORY_CARE_PROVIDER_SITE_OTHER): Payer: Medicare Other | Admitting: Family Medicine

## 2017-06-25 VITALS — BP 130/80 | HR 84 | Temp 97.9°F | Resp 20 | Wt 134.0 lb

## 2017-06-25 DIAGNOSIS — Z87891 Personal history of nicotine dependence: Secondary | ICD-10-CM | POA: Diagnosis not present

## 2017-06-25 DIAGNOSIS — I5022 Chronic systolic (congestive) heart failure: Secondary | ICD-10-CM

## 2017-06-25 DIAGNOSIS — I255 Ischemic cardiomyopathy: Secondary | ICD-10-CM | POA: Diagnosis not present

## 2017-06-25 DIAGNOSIS — J209 Acute bronchitis, unspecified: Secondary | ICD-10-CM

## 2017-06-25 MED ORDER — DOXYCYCLINE HYCLATE 100 MG PO TABS: 100.0000 mg | ORAL_TABLET | Freq: Two times a day (BID) | ORAL | 0 refills | Status: DC

## 2017-06-25 NOTE — Progress Notes (Signed)
Chief Complaint  Patient presents with  . bad cough    cough- had this for 2 weeks, coughing so much has made her eye flare up. SOB    Subjective:  Rachel Oneill is a 66 y.o. female who presents for possible bronchitis.  Symptoms include a 12 day history of cough that is gradually improving but only 10% better. Cough is productive of yellowish sputum. Also reports rhinorrhea, nasal congestion, right eye draining clear fluid and upper lid swollen and red. Reports feeling slightly more short of breath than usual. History of underlying CHF with a internal defibrillator. Dr. Jacinto HalimGanji is her cardiologist and she was seen 2 weeks ago per patient.     Denies fever, chills, chest pain, palpitations, orthopnea, LE edema.   Albuterol is was not helping with her cough or wheezing yesterday. Denies wheezing this morning.   Treatment to date: Mucinex, Robitussin, albterol, humidfier.  ? sick contacts.   She is a former smoker smoke.   She does have a history of bronchitis.   No other aggravating or relieving factors.  No other c/o.  FBS 120-130s.   The following portions of the patient's history were reviewed and updated as appropriate: allergies, current medications, past family history, past medical history, past social history, past surgical history and problem list.  ROS as in subjective  Past Medical History:  Diagnosis Date  . Bronchitis   . CHF (congestive heart failure) (HCC)   . Hypertension   . Pneumonia    2008ish  . 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 EOMs intact. Right upper lid with mild edema.                             Ears: pearly TMs, external ear canals normal                          Nose: septum midline, turbinates swollen, with erythema and clear discharge             Mouth/throat: MMM, tongue normal, mild pharyngeal erythema                           Neck: supple, no adenopathy, no thyromegaly, nontender                          Heart: RRR, normal S1, S2, no murmurs  Lungs: +bronchial breath sounds, +scattered rhonchi, faint distant wheezes, no rales                Extremities: no edema, nontender      Assessment: Acute bronchitis, unspecified organism  Former smoker  Chronic systolic heart failure (HCC)     Plan:  She is not in any distress today and no chest pain or palpitations. Euvolemic. Doxycycline prescribed. She will continue taking Mucinex and if her symptoms worsen over the weekend she will go to the ED due to underlying cardiac issues. Use albuterol as needed. Denies history of COPD or underlying lung disease.   Discussed diagnosis and treatment of bronchitis.  Suggested symptomatic OTC remedies for cough and congestion.  Tylenol for fever and malaise.    Advised that cough may linger even after the infection is improved.  Follow up if not improving.

## 2017-06-25 NOTE — Patient Instructions (Signed)
Take the antibiotic as prescribed, stay well hydrated, and take Mucinex for cough and congestion.   If you develop fever, worsening shortness of breath or any new symptoms like chest pain or palpitations, then go to the emergency room.

## 2017-06-28 ENCOUNTER — Telehealth: Payer: Self-pay

## 2017-06-28 NOTE — Telephone Encounter (Signed)
Left a friendly reminder call reminding pt that the  Cologuard hasn't been submitted.

## 2017-07-12 DIAGNOSIS — I255 Ischemic cardiomyopathy: Secondary | ICD-10-CM | POA: Diagnosis not present

## 2017-07-12 DIAGNOSIS — Z95810 Presence of automatic (implantable) cardiac defibrillator: Secondary | ICD-10-CM | POA: Diagnosis not present

## 2017-07-12 DIAGNOSIS — Z4502 Encounter for adjustment and management of automatic implantable cardiac defibrillator: Secondary | ICD-10-CM | POA: Diagnosis not present

## 2017-07-23 NOTE — Telephone Encounter (Signed)
Pt has still not completed Cologuard order.

## 2017-07-23 NOTE — Telephone Encounter (Signed)
Left message for patient that we are again reminding her that she still needs to submit the Cologuard test. If she has questions or concerns to please contact our office.

## 2017-07-23 NOTE — Telephone Encounter (Signed)
Ok, can you please call to remind her about this test a second time.  Thanks

## 2017-07-26 ENCOUNTER — Telehealth: Payer: Self-pay | Admitting: Gastroenterology

## 2017-07-26 NOTE — Telephone Encounter (Signed)
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 She is not sure why the test was not completed.  She mailed it in 2 months ago.

## 2017-07-26 NOTE — Telephone Encounter (Signed)
Left message on machine to call back  

## 2017-07-30 ENCOUNTER — Encounter (HOSPITAL_COMMUNITY): Payer: Self-pay | Admitting: Emergency Medicine

## 2017-07-30 ENCOUNTER — Emergency Department (HOSPITAL_COMMUNITY)
Admission: EM | Admit: 2017-07-30 | Discharge: 2017-07-30 | Disposition: A | Discharge status: Home / Self Care | Payer: Medicare Other | Attending: Emergency Medicine | Admitting: Emergency Medicine

## 2017-07-30 ENCOUNTER — Emergency Department (HOSPITAL_COMMUNITY): Payer: Medicare Other

## 2017-07-30 ENCOUNTER — Other Ambulatory Visit: Payer: Self-pay

## 2017-07-30 DIAGNOSIS — Z79899 Other long term (current) drug therapy: Secondary | ICD-10-CM | POA: Diagnosis not present

## 2017-07-30 DIAGNOSIS — J441 Chronic obstructive pulmonary disease with (acute) exacerbation: Secondary | ICD-10-CM | POA: Insufficient documentation

## 2017-07-30 DIAGNOSIS — Z87891 Personal history of nicotine dependence: Secondary | ICD-10-CM | POA: Diagnosis not present

## 2017-07-30 DIAGNOSIS — I2510 Atherosclerotic heart disease of native coronary artery without angina pectoris: Secondary | ICD-10-CM | POA: Insufficient documentation

## 2017-07-30 DIAGNOSIS — E1122 Type 2 diabetes mellitus with diabetic chronic kidney disease: Secondary | ICD-10-CM | POA: Insufficient documentation

## 2017-07-30 DIAGNOSIS — Z7984 Long term (current) use of oral hypoglycemic drugs: Secondary | ICD-10-CM | POA: Insufficient documentation

## 2017-07-30 DIAGNOSIS — R0602 Shortness of breath: Secondary | ICD-10-CM | POA: Diagnosis not present

## 2017-07-30 DIAGNOSIS — I130 Hypertensive heart and chronic kidney disease with heart failure and stage 1 through stage 4 chronic kidney disease, or unspecified chronic kidney disease: Secondary | ICD-10-CM | POA: Insufficient documentation

## 2017-07-30 DIAGNOSIS — I5022 Chronic systolic (congestive) heart failure: Secondary | ICD-10-CM | POA: Diagnosis not present

## 2017-07-30 DIAGNOSIS — R05 Cough: Secondary | ICD-10-CM | POA: Diagnosis not present

## 2017-07-30 DIAGNOSIS — N183 Chronic kidney disease, stage 3 (moderate): Secondary | ICD-10-CM | POA: Insufficient documentation

## 2017-07-30 DIAGNOSIS — Z7982 Long term (current) use of aspirin: Secondary | ICD-10-CM | POA: Diagnosis not present

## 2017-07-30 DIAGNOSIS — E785 Hyperlipidemia, unspecified: Secondary | ICD-10-CM | POA: Insufficient documentation

## 2017-07-30 DIAGNOSIS — R9431 Abnormal electrocardiogram [ECG] [EKG]: Secondary | ICD-10-CM | POA: Diagnosis not present

## 2017-07-30 LAB — COMPREHENSIVE METABOLIC PANEL
ALT: 29 U/L (ref 14–54)
AST: 31 U/L (ref 15–41)
Albumin: 3.9 g/dL (ref 3.5–5.0)
Alkaline Phosphatase: 152 U/L — ABNORMAL HIGH (ref 38–126)
Anion gap: 12 (ref 5–15)
BUN: 27 mg/dL — ABNORMAL HIGH (ref 6–20)
CHLORIDE: 104 mmol/L (ref 101–111)
CO2: 22 mmol/L (ref 22–32)
Calcium: 9.1 mg/dL (ref 8.9–10.3)
Creatinine, Ser: 1.71 mg/dL — ABNORMAL HIGH (ref 0.44–1.00)
GFR, EST AFRICAN AMERICAN: 34 mL/min — AB (ref >60)
GFR, EST NON AFRICAN AMERICAN: 30 mL/min — AB (ref >60)
Glucose, Bld: 228 mg/dL — ABNORMAL HIGH (ref 65–99)
POTASSIUM: 3.4 mmol/L — AB (ref 3.5–5.1)
SODIUM: 138 mmol/L (ref 135–145)
Total Bilirubin: 1.6 mg/dL — ABNORMAL HIGH (ref 0.3–1.2)
Total Protein: 7.4 g/dL (ref 6.5–8.1)

## 2017-07-30 LAB — I-STAT TROPONIN, ED: TROPONIN I, POC: 0.04 ng/mL (ref 0.00–0.08)

## 2017-07-30 LAB — CBC WITH DIFFERENTIAL/PLATELET
BASOS ABS: 0 10*3/uL (ref 0.0–0.1)
Basophils Relative: 0 %
EOS ABS: 0 10*3/uL (ref 0.0–0.7)
EOS PCT: 0 %
HCT: 38.8 % (ref 36.0–46.0)
Hemoglobin: 13.3 g/dL (ref 12.0–15.0)
LYMPHS ABS: 1 10*3/uL (ref 0.7–4.0)
LYMPHS PCT: 8 %
MCH: 33.3 pg (ref 26.0–34.0)
MCHC: 34.3 g/dL (ref 30.0–36.0)
MCV: 97 fL (ref 78.0–100.0)
MONO ABS: 1.5 10*3/uL — AB (ref 0.1–1.0)
Monocytes Relative: 12 %
Neutro Abs: 10.1 10*3/uL — ABNORMAL HIGH (ref 1.7–7.7)
Neutrophils Relative %: 80 %
PLATELETS: 172 10*3/uL (ref 150–400)
RBC: 4 MIL/uL (ref 3.87–5.11)
RDW: 14.6 % (ref 11.5–15.5)
WBC: 12.6 10*3/uL — ABNORMAL HIGH (ref 4.0–10.5)

## 2017-07-30 LAB — BRAIN NATRIURETIC PEPTIDE: B NATRIURETIC PEPTIDE 5: 3555.7 pg/mL — AB (ref 0.0–100.0)

## 2017-07-30 MED ORDER — AMOXICILLIN-POT CLAVULANATE 875-125 MG PO TABS
1.0000 | ORAL_TABLET | Freq: Once | ORAL | Status: AC
  Administered 2017-07-30: 1 via ORAL
  Filled 2017-07-30: qty 1

## 2017-07-30 MED ORDER — PREDNISONE 20 MG PO TABS
60.0000 mg | ORAL_TABLET | Freq: Once | ORAL | Status: AC
  Administered 2017-07-30: 60 mg via ORAL
  Filled 2017-07-30: qty 3

## 2017-07-30 MED ORDER — PREDNISONE 20 MG PO TABS: 20.0000 mg | ORAL_TABLET | Freq: Two times a day (BID) | ORAL | 0 refills | Status: DC

## 2017-07-30 MED ORDER — AMOXICILLIN-POT CLAVULANATE 875-125 MG PO TABS: 1.0000 | ORAL_TABLET | Freq: Two times a day (BID) | ORAL | 0 refills | Status: DC

## 2017-07-30 NOTE — ED Provider Notes (Signed)
[facility] EMERGENCY DEPARTMENT Provider Note   CSN: [id number] Arrival date & time: 07/30/17  1851     History   Chief Complaint Chief Complaint  Patient presents with  . Shortness of Breath  . Cough    HPI Rachel Oneill is a 66 y.o. female.  She presents for evaluation of cough with shortness of breath, which started today.  She had a similar episode several weeks ago, was seen by her PCP and started on doxycycline.  She has been using her albuterol inhaler about once a day, as needed.  She does not currently smoke cigarettes.  She denies weight gain.  She denies chest pain.  She has had cough productive of yellow or clear sputum for the last 2 days.  Yesterday she felt somewhat weak.  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  They tended to make her have more trouble breathing.  She denies chest pain, headache, back pain, weakness or dizziness.  There are no other known modifying factors.    HPI  Past Medical History:  Diagnosis Date  . Bronchitis   . CHF (congestive heart failure) (HCC)   . Hypertension   . Pneumonia    2008ish  . Uncontrolled diabetes mellitus with complications (HCC)    Type II    Patient Active Problem List   Diagnosis Date Noted  . Ischemic cardiomyopathy 12/29/2016  . Chronic kidney disease, stage 3, mod decreased GFR (HCC) 08/07/2016  . Vitamin D deficiency 08/07/2016  . AAA (abdominal aortic aneurysm) without rupture (HCC) 11/04/2015  . PAD (peripheral artery disease) (HCC) 11/04/2015  . Chronic systolic heart failure (HCC) 10/11/2013  . CAD (coronary artery disease) of artery bypass graft 07/12/2013  . Hyperlipidemia LDL goal <100 07/12/2013  . Essential hypertension 07/12/2013  . Type 2 diabetes with nephropathy (HCC) 07/12/2013  . Mitral regurgitation 07/12/2013    Past Surgical History:  Procedure  Laterality Date  . CARDIAC SURGERY     double bypass 2007  . CORONARY ARTERY BYPASS GRAFT    . ICD IMPLANT N/A 12/29/2016   Procedure: ICD Implant;  Surgeon: Regan Lemming, MD;  Location: [facility] INVASIVE CV LAB;  Service: Cardiovascular;  Laterality: N/A;  . LIGATION OF ARTERIOVENOUS  FISTULA Left 12/08/2016   Procedure: LIGATION OF Left arm ARTERIOVENOUS  FISTULA;  Surgeon: Sherren Kerns, MD;  Location: [facility] OR;  Service: Vascular;  Laterality: Left;    OB History    No data available       Home Medications    Prior to Admission medications   Medication Sig Start Date End Date Taking? Authorizing Provider  albuterol (PROVENTIL HFA;VENTOLIN HFA) 108 (90 Base) MCG/ACT inhaler Inhale 1 puff into the lungs every 6 (six) hours as needed for wheezing or shortness of breath. 03/18/17  Yes Henson, Vickie L, NP-C  aspirin 81 MG chewable tablet Chew 1 tablet (81 mg total) by mouth daily. 07/05/13  Yes Richarda Overlie, MD  atorvastatin (LIPITOR) 80 MG tablet Take 1 tablet (80 mg total) by mouth daily. 10/10/13  Yes Richarda Overlie, MD  ENTRESTO 97-103 MG Take 1 tablet by mouth 2 (two) times daily.  01/24/16  Yes [provider]  furosemide (LASIX) 20 MG tablet Take 20 mg by mouth daily.    Yes [provider]  ivabradine (CORLANOR) 5 MG TABS tablet Take 5 mg by mouth  2 (two) times daily with a meal.   Yes [provider]  KLOR-CON M20 20 MEQ tablet TAKE ONE TABLET BY MOUTH WHEN YOU TAKE LASIX 03/12/17  Yes Henson, Vickie L, NP-C  metFORMIN (GLUCOPHAGE) 500 MG tablet TAKE ONE TABLET BY MOUTH TWICE DAILY WITH A MEAL 04/13/17  Yes Henson, Vickie L, NP-C  metoprolol succinate (TOPROL-XL) 50 MG 24 hr tablet Take 50 mg by mouth daily. Take with or immediately following a meal.   Yes [provider]  torsemide (DEMADEX) 20 MG tablet Take 20 mg by mouth daily.   Yes [provider]  VITAMIN D, CHOLECALCIFEROL, PO Take 2,000 Units by mouth every other day.   Yes [provider]    Family History Family History  Problem Relation Age of Onset  . Diabetes Mother   . Brain cancer Mother   . Cancer Father        Neck  . Colon cancer Neg Hx   . Stomach cancer Neg Hx     Social History Social History   Tobacco Use  . Smoking status: Former Smoker    Years: 30.00    Types: Cigarettes    Last attempt to quit: 05/31/2006    Years since quitting: 11.1  . Smokeless tobacco: Never Used  Substance Use Topics  . Alcohol use: No  . Drug use: No     Allergies   Shellfish allergy   Review of Systems Review of Systems  All other systems reviewed and are negative.    Physical Exam Updated Vital Signs BP (!) 149/91   Pulse 88   Temp 98.2 F (36.8 C) (Oral)   Resp (!) 24   Ht 5\' 4"  (1.626 m)   Wt 58.1 kg (128 lb)   SpO2 91%   BMI 21.97 kg/m   Physical Exam  Constitutional: She is oriented to person, place, and time. She appears well-developed. She does not appear ill.  Elderly, frail  HENT:  Head: Normocephalic and atraumatic.  Eyes: Conjunctivae and EOM are normal. Pupils are equal, round, and reactive to light.  Neck: Normal range of motion and phonation normal. Neck supple.  Cardiovascular: Normal rate and regular rhythm.  Pulmonary/Chest: Effort normal and breath sounds normal. She has no decreased breath sounds. She has no wheezes. She has no rhonchi. She has no rales. She exhibits no tenderness.  Abdominal: Soft. She exhibits no distension. There is no tenderness. There is no guarding.  Musculoskeletal: Normal range of motion.       Right lower leg: She exhibits edema. She exhibits no tenderness.       Left lower leg: She exhibits edema. She exhibits no tenderness.  1+ edema present lower legs  Neurological: She is alert and oriented to person, place, and time. She exhibits normal muscle tone.  Skin: Skin is warm and dry. No erythema.  Psychiatric: She has a normal mood and affect. Her behavior is normal. Judgment and  thought content normal.  Nursing note and vitals reviewed.    ED Treatments / Results  Labs (all labs ordered are listed, but only abnormal results are displayed) Labs Reviewed  CBC WITH DIFFERENTIAL/PLATELET - Abnormal; Notable for the following components:      Result Value   WBC 12.6 (*)    Neutro Abs 10.1 (*)    Monocytes Absolute 1.5 (*)    All other components within normal limits  COMPREHENSIVE METABOLIC PANEL - Abnormal; Notable for the following components:   Potassium 3.4 (*)  Glucose, Bld 228 (*)    BUN 27 (*)    Creatinine, Ser 1.71 (*)    Alkaline Phosphatase 152 (*)    Total Bilirubin 1.6 (*)    GFR calc non Af Amer 30 (*)    GFR calc Af Amer 34 (*)    All other components within normal limits  BRAIN NATRIURETIC PEPTIDE - Abnormal; Notable for the following components:   B Natriuretic Peptide 3,555.7 (*)    All other components within normal limits  I-STAT TROPONIN, ED    EKG  EKG Interpretation None       Radiology Dg Chest 2 View  Result Date: 07/30/2017 CLINICAL DATA:  Productive cough.  Shortness of breath. EXAM: CHEST  2 VIEW COMPARISON:  12/30/2016. FINDINGS: Stable enlarged cardiac silhouette, left subclavian AICD lead and prosthetic heart valve. Stable post CABG changes and coronary artery stents. Clear lungs with normal vascularity. The lungs remain mildly hyperexpanded with mildly prominent interstitial markings. Unremarkable bones. IMPRESSION: No acute abnormality.  Stable cardiomegaly and changes of COPD. Electronically Signed   By: Beckie Salts M.D.   On: 07/30/2017 19:46    Procedures Procedures (including critical care time)  Medications Ordered in ED Medications - No data to display   Initial Impression / Assessment and Plan / ED Course  I have reviewed the triage vital signs and the nursing notes.  Pertinent labs & imaging results that were available during my care of the patient were reviewed by me and considered in my medical  decision making (see chart for details).      Patient Vitals for the past 24 hrs:  BP Temp Temp src Pulse Resp SpO2 Height Weight  07/30/17 2115 (!) 149/91 - - 88 (!) 24 91 % - -  07/30/17 1904 - - - - - - 5\' 4"  (1.626 m) 58.1 kg (128 lb)  07/30/17 1902 126/84 98.2 F (36.8 C) Oral 77 20 94 % - -    11:13 PM Reevaluation with update and discussion. After initial assessment and treatment, an updated evaluation reveals she remains comfortable has no further complaints.  Findings discussed and questions answered. Mancel Bale      Final Clinical Impressions(s) / ED Diagnoses   Final diagnoses:  COPD exacerbation (HCC)   Evaluation consistent with bronchitis, possibly irritant related, versus recurrent related to prior respiratory infection about 6 weeks ago.  BNP elevated however her weight is at baseline, and her lung exam is reassuring.  Doubt significant acute congestive heart failure.   Nursing Notes Reviewed/ Care Coordinated Applicable Imaging Reviewed Interpretation of Laboratory Data incorporated into ED treatment  The patient appears reasonably screened and/or stabilized for discharge and I doubt any other medical condition or other [facility] requiring further screening, evaluation, or treatment in the ED at this time prior to discharge.  Plan: Home Medications-continue current medications; Home Treatments-rest; return here if the recommended treatment, does not improve the symptoms; Recommended follow up-PCP checkup as needed.   ED Discharge Orders    None       Mancel Bale, MD 07/30/17 2314

## 2017-07-30 NOTE — Discharge Instructions (Signed)
Use your albuterol inhaler 2 puffs every 4 hours as needed for cough or trouble breathing.  Start the prescriptions for prednisone and Augmentin, tomorrow.  Return here, if needed, for problems.

## 2017-07-30 NOTE — ED Triage Notes (Signed)
Pt st's she has had cold symptoms off and on x's 1 month.  St's now she has a productive cough with shortness of breath.  Pt denies chest pain.

## 2017-08-13 ENCOUNTER — Ambulatory Visit: Payer: Medicare Other | Admitting: Family Medicine

## 2017-08-14 NOTE — Patient Instructions (Signed)
Not seen

## 2017-08-14 NOTE — Progress Notes (Signed)
   Subjective:    Patient ID: Rachel Oneill, female    DOB: 1951/03/02, 67 y.o.   MRN: [id number]  HPI    Review of Systems     Objective:   Physical Exam        Assessment & Plan:

## 2017-08-16 DIAGNOSIS — I255 Ischemic cardiomyopathy: Secondary | ICD-10-CM | POA: Diagnosis not present

## 2017-08-16 DIAGNOSIS — Z4502 Encounter for adjustment and management of automatic implantable cardiac defibrillator: Secondary | ICD-10-CM | POA: Diagnosis not present

## 2017-08-16 DIAGNOSIS — Z95810 Presence of automatic (implantable) cardiac defibrillator: Secondary | ICD-10-CM | POA: Diagnosis not present

## 2017-08-31 ENCOUNTER — Other Ambulatory Visit: Payer: Self-pay | Admitting: Family Medicine

## 2017-08-31 ENCOUNTER — Telehealth: Payer: Self-pay | Admitting: Medical

## 2017-08-31 DIAGNOSIS — I255 Ischemic cardiomyopathy: Secondary | ICD-10-CM | POA: Diagnosis not present

## 2017-08-31 DIAGNOSIS — I420 Dilated cardiomyopathy: Secondary | ICD-10-CM | POA: Diagnosis not present

## 2017-08-31 DIAGNOSIS — I5042 Chronic combined systolic (congestive) and diastolic (congestive) heart failure: Secondary | ICD-10-CM | POA: Diagnosis not present

## 2017-08-31 DIAGNOSIS — Z95810 Presence of automatic (implantable) cardiac defibrillator: Secondary | ICD-10-CM | POA: Diagnosis not present

## 2017-08-31 NOTE — Telephone Encounter (Signed)
Is this okay to refill? 

## 2017-08-31 NOTE — Telephone Encounter (Signed)
Called and l/m for pt to call us back to set up an appt  For refill on meds

## 2017-08-31 NOTE — Telephone Encounter (Signed)
Received a refill request for albuterol for this Rachel Oneill patient.  It appears that she has had a recent emergency department visit and is likely due for diabetes follow-up as well also please schedule appointment.  If needed refill the albuterol but get her in for appointment.

## 2017-09-20 DIAGNOSIS — I252 Old myocardial infarction: Secondary | ICD-10-CM | POA: Diagnosis not present

## 2017-09-20 DIAGNOSIS — Z4502 Encounter for adjustment and management of automatic implantable cardiac defibrillator: Secondary | ICD-10-CM | POA: Diagnosis not present

## 2017-09-20 DIAGNOSIS — Z95810 Presence of automatic (implantable) cardiac defibrillator: Secondary | ICD-10-CM | POA: Diagnosis not present

## 2017-10-05 ENCOUNTER — Telehealth: Payer: Self-pay | Admitting: Family Medicine

## 2017-10-05 ENCOUNTER — Other Ambulatory Visit: Payer: Self-pay | Admitting: Family Medicine

## 2017-10-05 NOTE — Telephone Encounter (Signed)
New Message  Pt verbalized needing something for her cough to get it calmed down without her having to go to ED.  Please f/u

## 2017-10-06 ENCOUNTER — Other Ambulatory Visit: Payer: Self-pay

## 2017-10-06 ENCOUNTER — Encounter (HOSPITAL_COMMUNITY): Payer: Self-pay | Admitting: Emergency Medicine

## 2017-10-06 ENCOUNTER — Emergency Department (HOSPITAL_COMMUNITY)
Admission: EM | Admit: 2017-10-06 | Discharge: 2017-10-06 | Disposition: A | Discharge status: Home / Self Care | Payer: Medicare Other | Attending: Emergency Medicine | Admitting: Emergency Medicine

## 2017-10-06 ENCOUNTER — Emergency Department (HOSPITAL_COMMUNITY): Payer: Medicare Other

## 2017-10-06 DIAGNOSIS — R05 Cough: Secondary | ICD-10-CM | POA: Insufficient documentation

## 2017-10-06 DIAGNOSIS — E1140 Type 2 diabetes mellitus with diabetic neuropathy, unspecified: Secondary | ICD-10-CM | POA: Diagnosis not present

## 2017-10-06 DIAGNOSIS — R0981 Nasal congestion: Secondary | ICD-10-CM | POA: Insufficient documentation

## 2017-10-06 DIAGNOSIS — I2510 Atherosclerotic heart disease of native coronary artery without angina pectoris: Secondary | ICD-10-CM | POA: Insufficient documentation

## 2017-10-06 DIAGNOSIS — R0602 Shortness of breath: Secondary | ICD-10-CM | POA: Diagnosis not present

## 2017-10-06 DIAGNOSIS — N183 Chronic kidney disease, stage 3 (moderate): Secondary | ICD-10-CM | POA: Diagnosis not present

## 2017-10-06 DIAGNOSIS — Z7984 Long term (current) use of oral hypoglycemic drugs: Secondary | ICD-10-CM | POA: Insufficient documentation

## 2017-10-06 DIAGNOSIS — Z87891 Personal history of nicotine dependence: Secondary | ICD-10-CM | POA: Diagnosis not present

## 2017-10-06 DIAGNOSIS — Z79899 Other long term (current) drug therapy: Secondary | ICD-10-CM | POA: Insufficient documentation

## 2017-10-06 DIAGNOSIS — Z7982 Long term (current) use of aspirin: Secondary | ICD-10-CM | POA: Diagnosis not present

## 2017-10-06 DIAGNOSIS — E1122 Type 2 diabetes mellitus with diabetic chronic kidney disease: Secondary | ICD-10-CM | POA: Insufficient documentation

## 2017-10-06 DIAGNOSIS — I5022 Chronic systolic (congestive) heart failure: Secondary | ICD-10-CM | POA: Diagnosis not present

## 2017-10-06 DIAGNOSIS — Z951 Presence of aortocoronary bypass graft: Secondary | ICD-10-CM | POA: Insufficient documentation

## 2017-10-06 DIAGNOSIS — R509 Fever, unspecified: Secondary | ICD-10-CM | POA: Diagnosis not present

## 2017-10-06 DIAGNOSIS — I130 Hypertensive heart and chronic kidney disease with heart failure and stage 1 through stage 4 chronic kidney disease, or unspecified chronic kidney disease: Secondary | ICD-10-CM | POA: Insufficient documentation

## 2017-10-06 DIAGNOSIS — R000 Tachycardia, unspecified: Secondary | ICD-10-CM | POA: Diagnosis not present

## 2017-10-06 DIAGNOSIS — R059 Cough, unspecified: Secondary | ICD-10-CM

## 2017-10-06 LAB — BASIC METABOLIC PANEL
Anion gap: 13 (ref 5–15)
BUN: 17 mg/dL (ref 6–20)
CALCIUM: 9 mg/dL (ref 8.9–10.3)
CO2: 21 mmol/L — AB (ref 22–32)
CREATININE: 1.62 mg/dL — AB (ref 0.44–1.00)
Chloride: 102 mmol/L (ref 101–111)
GFR calc Af Amer: 37 mL/min — ABNORMAL LOW (ref >60)
GFR calc non Af Amer: 32 mL/min — ABNORMAL LOW (ref >60)
GLUCOSE: 328 mg/dL — AB (ref 65–99)
Potassium: 3.6 mmol/L (ref 3.5–5.1)
Sodium: 136 mmol/L (ref 135–145)

## 2017-10-06 LAB — CBC WITH DIFFERENTIAL/PLATELET
BASOS PCT: 0 %
Basophils Absolute: 0 10*3/uL (ref 0.0–0.1)
EOS ABS: 0.1 10*3/uL (ref 0.0–0.7)
EOS PCT: 1 %
HCT: 37.9 % (ref 36.0–46.0)
Hemoglobin: 12.9 g/dL (ref 12.0–15.0)
LYMPHS ABS: 0.9 10*3/uL (ref 0.7–4.0)
Lymphocytes Relative: 10 %
MCH: 33.6 pg (ref 26.0–34.0)
MCHC: 34 g/dL (ref 30.0–36.0)
MCV: 98.7 fL (ref 78.0–100.0)
MONO ABS: 1 10*3/uL (ref 0.1–1.0)
MONOS PCT: 10 %
Neutro Abs: 7.5 10*3/uL (ref 1.7–7.7)
Neutrophils Relative %: 79 %
Platelets: 195 10*3/uL (ref 150–400)
RBC: 3.84 MIL/uL — ABNORMAL LOW (ref 3.87–5.11)
RDW: 15.2 % (ref 11.5–15.5)
WBC: 9.5 10*3/uL (ref 4.0–10.5)

## 2017-10-06 LAB — INFLUENZA PANEL BY PCR (TYPE A & B)
Influenza A By PCR: NEGATIVE
Influenza B By PCR: NEGATIVE

## 2017-10-06 LAB — BRAIN NATRIURETIC PEPTIDE: B Natriuretic Peptide: 3179.7 pg/mL — ABNORMAL HIGH (ref 0.0–100.0)

## 2017-10-06 LAB — I-STAT TROPONIN, ED
TROPONIN I, POC: 0.01 ng/mL (ref 0.00–0.08)
Troponin i, poc: 0.01 ng/mL (ref 0.00–0.08)

## 2017-10-06 MED ORDER — IPRATROPIUM-ALBUTEROL 0.5-2.5 (3) MG/3ML IN SOLN
3.0000 mL | Freq: Once | RESPIRATORY_TRACT | Status: AC
  Administered 2017-10-06: 3 mL via RESPIRATORY_TRACT
  Filled 2017-10-06: qty 3

## 2017-10-06 MED ORDER — ALBUTEROL SULFATE HFA 108 (90 BASE) MCG/ACT IN AERS: 1.0000 | INHALATION_SPRAY | Freq: Four times a day (QID) | RESPIRATORY_TRACT | 0 refills | Status: DC | PRN

## 2017-10-06 MED ORDER — BENZONATATE 100 MG PO CAPS
200.0000 mg | ORAL_CAPSULE | Freq: Once | ORAL | Status: AC
  Administered 2017-10-06: 200 mg via ORAL
  Filled 2017-10-06: qty 2

## 2017-10-06 MED ORDER — BENZONATATE 100 MG PO CAPS: 100.0000 mg | ORAL_CAPSULE | Freq: Three times a day (TID) | ORAL | 0 refills | Status: DC

## 2017-10-06 MED ORDER — DOXYCYCLINE HYCLATE 100 MG PO CAPS: 100.0000 mg | ORAL_CAPSULE | Freq: Two times a day (BID) | ORAL | 0 refills | Status: DC

## 2017-10-06 NOTE — Telephone Encounter (Signed)
Please call and let her know that I just got this message. How is she doing? She may need to come in and let me see what is going on and causing her cough.

## 2017-10-06 NOTE — Discharge Instructions (Signed)
Your lab work has been reassuring.  Please use the albuterol inhaler as needed.  Take the Tessalon for cough. Have given you short course of anitbiotcs.  Make sure you follow-up with your primary care and cardiologist this week.  Return the ED with any worsening symptoms.

## 2017-10-06 NOTE — Telephone Encounter (Signed)
Left message for pt to call me back 

## 2017-10-06 NOTE — ED Triage Notes (Signed)
Pt arrives from home c/o productive cough x 1 week, denies fevers, chills, body aches.  Pt speaking in full sentences, resp e/u.

## 2017-10-06 NOTE — Telephone Encounter (Signed)
Please advise as this was sent to me instead of you first

## 2017-10-06 NOTE — ED Notes (Signed)
Pt given [place] sandwich and coke. PA advised it was ok for pt to eat.

## 2017-10-06 NOTE — ED Provider Notes (Signed)
[facility] EMERGENCY DEPARTMENT Provider Note   CSN: [id number] Arrival date & time: 10/06/17  0704     History   Chief Complaint Chief Complaint  Patient presents with  . Cough    HPI Rachel Oneill is a 67 y.o. female.  HPI 67 year old African-American female past medical history significant for diabetes, hypertension, bronchitis, CHF last EF was 09/2016 was 15%, presents to the emergency department today with complaints of 1 week of productive cough.  She reports brown sputum production.  Worse than normal.  Patient reports that she diagnosed herself with the flu last week.  She reports congestion, fevers.  Patient states that her symptoms have improved except for this persistent cough.  Patient states that she is having some shortness of breath because her nose is congested and she is coughing.  She denies any associated chest pain.  Patient denies any significant swelling in her lower legs.  Patient denies any associated diarrhea or vomiting.  She denies any history of DVT/PE, prolonged immobilization, recent hospitalization/surgeries, unilateral leg swelling or calf tenderness, hemoptysis or hormone use.  Patient not currently anticoagulated. Pt denies any lung conditions. Prior hx of smoking, denies smoking at this time.   Pt denies any fever, chill, ha, vision changes, lightheadedness, dizziness, congestion, neck pain, cp, abd pain, n/v/d, urinary symptoms, change in bowel habits, melena, hematochezia, lower extremity paresthesias.  Past Medical History:  Diagnosis Date  . Bronchitis   . CHF (congestive heart failure) (HCC)   . Hypertension   . Pneumonia    2008ish  . Uncontrolled diabetes mellitus with complications (HCC)    Type II    Patient Active Problem List   Diagnosis Date Noted  . Ischemic cardiomyopathy 12/29/2016  . Chronic kidney disease, stage 3, mod decreased GFR (HCC) 08/07/2016  . Vitamin D deficiency 08/07/2016  . AAA (abdominal  aortic aneurysm) without rupture (HCC) 11/04/2015  . PAD (peripheral artery disease) (HCC) 11/04/2015  . Chronic systolic heart failure (HCC) 10/11/2013  . CAD (coronary artery disease) of artery bypass graft 07/12/2013  . Hyperlipidemia LDL goal <100 07/12/2013  . Essential hypertension 07/12/2013  . Type 2 diabetes with nephropathy (HCC) 07/12/2013  . Mitral regurgitation 07/12/2013    Past Surgical History:  Procedure Laterality Date  . CARDIAC SURGERY     double bypass 2007  . CORONARY ARTERY BYPASS GRAFT    . ICD IMPLANT N/A 12/29/2016   Procedure: ICD Implant;  Surgeon: Regan Lemming, MD;  Location: [facility] INVASIVE CV LAB;  Service: Cardiovascular;  Laterality: N/A;  . LIGATION OF ARTERIOVENOUS  FISTULA Left 12/08/2016   Procedure: LIGATION OF Left arm ARTERIOVENOUS  FISTULA;  Surgeon: Sherren Kerns, MD;  Location: [facility] OR;  Service: Vascular;  Laterality: Left;    OB History    No data available       Home Medications    Prior to Admission medications   Medication Sig Start Date End Date Taking? Authorizing Provider  albuterol (PROVENTIL HFA;VENTOLIN HFA) 108 (90 Base) MCG/ACT inhaler Inhale 1 puff into the lungs every 6 (six) hours as needed for wheezing or shortness of breath. 03/18/17   Henson, Vickie L, NP-C  amoxicillin-clavulanate (AUGMENTIN) 875-125 MG tablet Take 1 tablet by mouth 2 (two) times daily. One po bid x 7 days 07/30/17   Mancel Bale, MD  aspirin 81 MG chewable tablet Chew 1 tablet (81 mg total) by mouth daily. 07/05/13   Richarda Overlie, MD  atorvastatin (LIPITOR) 80 MG tablet Take  1 tablet (80 mg total) by mouth daily. 10/10/13   Richarda Overlie, MD  ENTRESTO 97-103 MG Take 1 tablet by mouth 2 (two) times daily.  01/24/16   [provider]  furosemide (LASIX) 20 MG tablet Take 20 mg by mouth daily.     [provider]  ivabradine (CORLANOR) 5 MG TABS tablet Take 5 mg by mouth 2 (two) times daily with a meal.    [provider]    KLOR-CON M20 20 MEQ tablet TAKE ONE TABLET BY MOUTH WHEN YOU TAKE LASIX 03/12/17   Henson, Vickie L, NP-C  metFORMIN (GLUCOPHAGE) 500 MG tablet TAKE ONE TABLET BY MOUTH TWICE DAILY WITH A MEAL 04/13/17   Henson, Vickie L, NP-C  metoprolol succinate (TOPROL-XL) 50 MG 24 hr tablet Take 50 mg by mouth daily. Take with or immediately following a meal.    [provider]  predniSONE (DELTASONE) 20 MG tablet Take 1 tablet (20 mg total) by mouth 2 (two) times daily. 07/30/17   Mancel Bale, MD  torsemide (DEMADEX) 20 MG tablet Take 20 mg by mouth daily.    [provider]  VITAMIN D, CHOLECALCIFEROL, PO Take 2,000 Units by mouth every other day.    [provider]    Family History Family History  Problem Relation Age of Onset  . Diabetes Mother   . Brain cancer Mother   . Cancer Father        Neck  . Colon cancer Neg Hx   . Stomach cancer Neg Hx     Social History Social History   Tobacco Use  . Smoking status: Former Smoker    Years: 30.00    Types: Cigarettes    Last attempt to quit: 05/31/2006    Years since quitting: 11.3  . Smokeless tobacco: Never Used  Substance Use Topics  . Alcohol use: No  . Drug use: No     Allergies   Shellfish allergy   Review of Systems Review of Systems  Constitutional: Negative for chills and fever.  HENT: Positive for congestion, postnasal drip, rhinorrhea, sinus pressure, sinus pain and sneezing. Negative for sore throat.   Eyes: Negative for visual disturbance.  Respiratory: Positive for cough and shortness of breath.   Cardiovascular: Negative for chest pain, palpitations and leg swelling.  Gastrointestinal: Negative for abdominal pain, diarrhea, nausea and vomiting.  Genitourinary: Negative for dysuria, flank pain, frequency, hematuria, urgency, vaginal bleeding and vaginal discharge.  Musculoskeletal: Negative for arthralgias and myalgias.  Skin: Negative for rash.  Neurological: Negative for dizziness,  syncope, weakness, light-headedness, numbness and headaches.  Psychiatric/Behavioral: Negative for sleep disturbance. The patient is not nervous/anxious.      Physical Exam Updated Vital Signs BP 134/86 (BP Location: Right Arm)   Pulse 95   Temp 97.7 F (36.5 C)   Resp (!) 21   SpO2 100%   Physical Exam  Constitutional: She is oriented to person, place, and time. She appears well-developed and well-nourished.  Non-toxic appearance. No distress.  HENT:  Head: Normocephalic and atraumatic.  Nose: Nose normal.  Mouth/Throat: Oropharynx is clear and moist.  Eyes: Conjunctivae are normal. Pupils are equal, round, and reactive to light. Right eye exhibits no discharge. Left eye exhibits no discharge.  Neck: Normal range of motion. Neck supple. No JVD present. No tracheal deviation present.  Cardiovascular: Normal rate, regular rhythm, normal heart sounds and intact distal pulses. Exam reveals no gallop and no friction rub.  No murmur heard. Pulmonary/Chest: Effort normal. No  stridor. No respiratory distress. She has wheezes (faint expiratory). She has no rales. She exhibits no tenderness.  No hypoxia or tachypnea.  Abdominal: Soft. Bowel sounds are normal. She exhibits no distension. There is no tenderness. There is no rebound and no guarding.  Musculoskeletal: Normal range of motion.  No lower extremity edema or calf tenderness.  Lymphadenopathy:    She has no cervical adenopathy.  Neurological: She is alert and oriented to person, place, and time.  Skin: Skin is warm and dry. Capillary refill takes less than 2 seconds. She is not diaphoretic.  Psychiatric: Her behavior is normal. Judgment and thought content normal.  Nursing note and vitals reviewed.    ED Treatments / Results  Labs (all labs ordered are listed, but only abnormal results are displayed) Labs Reviewed  BASIC METABOLIC PANEL  CBC WITH DIFFERENTIAL/PLATELET  BRAIN NATRIURETIC PEPTIDE  INFLUENZA PANEL BY PCR (TYPE  A & B)  I-STAT TROPONIN, ED    EKG  EKG Interpretation  Date/Time:  Wednesday October 06 2017 07:27:27 EST Ventricular Rate:  101 PR Interval:    QRS Duration: 83 QT Interval:  354 QTC Calculation: 459 R Axis:   57 Text Interpretation:  Sinus tachycardia Atrial premature complexes Abnormal R-wave progression, early transition Nonspecific T abnormalities, lateral leads Abnormal ekg Confirmed by Gerhard Munch [id number]) on 10/06/2017 7:29:50 AM       Radiology No results found.  Procedures Procedures (including critical care time)  Medications Ordered in ED Medications  benzonatate (TESSALON) capsule 200 mg (not administered)  ipratropium-albuterol (DUONEB) 0.5-2.5 (3) MG/3ML nebulizer solution 3 mL (3 mLs Nebulization Given 10/06/17 0737)     Initial Impression / Assessment and Plan / ED Course  I have reviewed the triage vital signs and the nursing notes.  Pertinent labs & imaging results that were available during my care of the patient were reviewed by me and considered in my medical decision making (see chart for details).     Patient presents to the ED with complaints of productive cough following influenza last week.  She reports brown sputum production.  Patient reports some shortness of breath due to nasal congestion and cough but denies any associated chest pain.  Patient with a history of significant congestive heart failure however she denies any fluid overload symptoms.  Patient is overall well-appearing and nontoxic.  Her vital signs are reassuring.  Saturations have been above 90%.  No tachypnea noted.  Patient is afebrile.  Blood pressures are unremarkable.  Lab work is reassuring.  No leukocytosis.  Negative influenza.  Kidney function at baseline.  Delta troponins are negative.  Patient's BNP is elevated which is baseline for her.  She does not appear fluid overload.  Chest x-ray shows COPD changes but no acute findings for focal infiltrate as reviewed by  me.  No pulmonary edema noted. EKG does not show any ischemic changes.   Patient symptoms seem consistent with likely a viral bronchitis.  She was seen in December for same symptoms diagnosed with bronchitis at that time.  She does not appear to be in acute congestive heart failure.  She is well controlled on medications.  She is followed by cardiology in the heart failure clinic.  Patient did have some mild expiratory wheezes on my initial exam.  She was given 2 DuoNeb treatments and Tessalon.  On repeat assessment patient states that she feels back to baseline and significantly improved.  She states that her breathing is normal at this time.  States that  her cough has improved.  Feels that she can be discharged.  She was able to ambulate in the department with saturations above 92%.  She states that her breathing appears back at baseline with walking.  Patient has no retractions.  There is no increased work of breathing or tachypnea noted.  Patient states that she needs a refill of her albuterol as it is out she has no refills left.  Clinical presentation does not seem consistent with PE, dissection, ACS, pneumonia.  Patient symptoms seem consistent with likely acute on chronic bronchitis versus COPD.  Patient is nontoxic appearing.  Does not appear to be in respiratory distress.  Patient does have history of diabetes that seems uncontrolled.  Do not want to start prednisone. Encouraged her to follow-up with her primary care doctor and her cardiologist this week.  Have discussed very strict return precautions with patient and patient verbalized understanding of plan of care.  Pt is hemodynamically stable, in NAD, & able to ambulate in the ED. Evaluation does not show pathology that would require ongoing emergent intervention or inpatient treatment. I explained the diagnosis to the patient. Pain has been managed & has no complaints prior to dc. Pt is comfortable with above plan and is stable for  discharge at this time. All questions were answered prior to disposition. Strict return precautions for f/u to the ED were discussed. Encouraged follow up with PCP.   Final Clinical Impressions(s) / ED Diagnoses   Final diagnoses:  Cough    ED Discharge Orders        Ordered    albuterol (PROVENTIL HFA;VENTOLIN HFA) 108 (90 Base) MCG/ACT inhaler  Every 6 hours PRN     10/06/17 1217    benzonatate (TESSALON) 100 MG capsule  Every 8 hours     10/06/17 1217    doxycycline (VIBRAMYCIN) 100 MG capsule  2 times daily     10/06/17 1220       Wallace Keller 10/06/17 1224    Gerhard Munch, MD 10/06/17 1341

## 2017-10-06 NOTE — Telephone Encounter (Signed)
Pt went to ER today

## 2017-10-12 NOTE — Telephone Encounter (Signed)
Order cancelled due to inactivity 

## 2017-10-25 DIAGNOSIS — Z95810 Presence of automatic (implantable) cardiac defibrillator: Secondary | ICD-10-CM | POA: Diagnosis not present

## 2017-10-25 DIAGNOSIS — Z4502 Encounter for adjustment and management of automatic implantable cardiac defibrillator: Secondary | ICD-10-CM | POA: Diagnosis not present

## 2017-10-25 DIAGNOSIS — I255 Ischemic cardiomyopathy: Secondary | ICD-10-CM | POA: Diagnosis not present

## 2017-11-01 DIAGNOSIS — I493 Ventricular premature depolarization: Secondary | ICD-10-CM | POA: Diagnosis not present

## 2017-11-01 DIAGNOSIS — I255 Ischemic cardiomyopathy: Secondary | ICD-10-CM | POA: Diagnosis not present

## 2017-11-01 DIAGNOSIS — I5042 Chronic combined systolic (congestive) and diastolic (congestive) heart failure: Secondary | ICD-10-CM | POA: Diagnosis not present

## 2017-11-01 DIAGNOSIS — I420 Dilated cardiomyopathy: Secondary | ICD-10-CM | POA: Diagnosis not present

## 2017-11-03 ENCOUNTER — Telehealth (HOSPITAL_COMMUNITY): Payer: Self-pay

## 2017-11-03 NOTE — Telephone Encounter (Signed)
Patients insurance is active and benefits verified through Medicare A/B - No co-pay, deductible amount of $185.00/$185.00 has been met, no out of pocket, 20% co-insurance, and no pre-authorization is required. Passport/reference #[id number] °

## 2017-11-04 ENCOUNTER — Telehealth (HOSPITAL_COMMUNITY): Payer: Self-pay

## 2017-11-04 NOTE — Telephone Encounter (Signed)
Attempted to call patient in regards to Cardiac Rehab - lm on vm °

## 2017-11-15 ENCOUNTER — Telehealth (HOSPITAL_COMMUNITY): Payer: Self-pay

## 2017-11-15 NOTE — Telephone Encounter (Signed)
2nd attempt to call patient in regards to Cardiac Rehab - lm on vm. Sending letter. °

## 2017-11-22 ENCOUNTER — Telehealth (HOSPITAL_COMMUNITY): Payer: Self-pay

## 2017-11-22 NOTE — Telephone Encounter (Signed)
3rd attempt to call patient in regards to Cardiac Rehab - lm on vm °

## 2017-11-25 ENCOUNTER — Telehealth: Payer: Self-pay

## 2017-11-25 NOTE — Telephone Encounter (Signed)
Called pt to schedule awv . No answer lvm KH 11-25-17

## 2017-11-29 DIAGNOSIS — I5042 Chronic combined systolic (congestive) and diastolic (congestive) heart failure: Secondary | ICD-10-CM | POA: Diagnosis not present

## 2017-11-29 DIAGNOSIS — I714 Abdominal aortic aneurysm, without rupture: Secondary | ICD-10-CM | POA: Diagnosis not present

## 2017-11-29 DIAGNOSIS — Z95810 Presence of automatic (implantable) cardiac defibrillator: Secondary | ICD-10-CM | POA: Diagnosis not present

## 2017-11-29 DIAGNOSIS — I2510 Atherosclerotic heart disease of native coronary artery without angina pectoris: Secondary | ICD-10-CM | POA: Diagnosis not present

## 2017-11-30 ENCOUNTER — Telehealth (HOSPITAL_COMMUNITY): Payer: Self-pay

## 2017-11-30 NOTE — Telephone Encounter (Signed)
No response from patient - Closed referral. °

## 2017-12-03 ENCOUNTER — Encounter: Payer: Self-pay | Admitting: Family Medicine

## 2017-12-03 ENCOUNTER — Ambulatory Visit (INDEPENDENT_AMBULATORY_CARE_PROVIDER_SITE_OTHER): Payer: Medicare Other | Admitting: Family Medicine

## 2017-12-03 VITALS — BP 132/84 | HR 80 | Temp 97.5°F | Ht 66.0 in | Wt 135.4 lb

## 2017-12-03 DIAGNOSIS — E1121 Type 2 diabetes mellitus with diabetic nephropathy: Secondary | ICD-10-CM

## 2017-12-03 LAB — GLUCOSE, POCT (MANUAL RESULT ENTRY): POC GLUCOSE: 222 mg/dL — AB (ref 70–99)

## 2017-12-03 LAB — POCT GLYCOSYLATED HEMOGLOBIN (HGB A1C): Hemoglobin A1C: >14

## 2017-12-03 NOTE — Patient Instructions (Addendum)
Take 2 Metformin twice per day Start checking your blood sugars either before a meal or 2 hours after meal Bring in all your blood sugar readings

## 2017-12-03 NOTE — Addendum Note (Signed)
Addended by: Renelda Loma on: 12/03/2017 04:03 PM   Modules accepted: Orders

## 2017-12-03 NOTE — Progress Notes (Signed)
   Subjective:    Patient ID: Rachel Oneill, female    DOB: 28-Apr-1951, 67 y.o.   MRN: [id number]  HPI She is here because of elevated blood sugar.  She has been taking metformin at home and states her blood sugars were really doing fairly well however she was recently seen by her cardiologist and noted to have elevated blood sugar.  She was placed on glimepiride 2 mg.  She is taking 500 mg twice daily of metformin.  She does complain of some fatigue and blurred vision.   Review of Systems     Objective:   Physical Exam Alert and in no distress.  Hemoglobin A1c is greater than 14.       Assessment & Plan:  Type 2 diabetes with nephropathy (HCC) She apparently has plenty of metformin at home.  I Rachel have her increase it to 2 pills twice per day.  She Rachel continue on glimepiride.  She is to keep track of her blood sugars and return here in 2 weeks for recheck.  She is to call if she has any difficulties.

## 2017-12-08 ENCOUNTER — Telehealth: Payer: Self-pay

## 2017-12-08 MED ORDER — BLOOD GLUCOSE TEST VI STRP: ORAL_STRIP | 1 refills | Status: DC

## 2017-12-08 NOTE — Telephone Encounter (Signed)
Called pt and she states she has a one touch ultra mini meter and test twice a day

## 2017-12-08 NOTE — Telephone Encounter (Signed)
Pt called wanting to see if we could call in RX for blood sugar strips, she states that she can not afford them.

## 2017-12-11 ENCOUNTER — Emergency Department (HOSPITAL_COMMUNITY)
Admission: EM | Admit: 2017-12-11 | Discharge: 2017-12-11 | Disposition: A | Discharge status: Home / Self Care | Payer: Medicare Other | Attending: Emergency Medicine | Admitting: Emergency Medicine

## 2017-12-11 ENCOUNTER — Encounter (HOSPITAL_COMMUNITY): Payer: Self-pay | Admitting: Emergency Medicine

## 2017-12-11 ENCOUNTER — Other Ambulatory Visit: Payer: Self-pay

## 2017-12-11 DIAGNOSIS — E1122 Type 2 diabetes mellitus with diabetic chronic kidney disease: Secondary | ICD-10-CM | POA: Diagnosis not present

## 2017-12-11 DIAGNOSIS — N289 Disorder of kidney and ureter, unspecified: Secondary | ICD-10-CM

## 2017-12-11 DIAGNOSIS — I5022 Chronic systolic (congestive) heart failure: Secondary | ICD-10-CM | POA: Diagnosis not present

## 2017-12-11 DIAGNOSIS — E162 Hypoglycemia, unspecified: Secondary | ICD-10-CM | POA: Diagnosis present

## 2017-12-11 DIAGNOSIS — Z951 Presence of aortocoronary bypass graft: Secondary | ICD-10-CM | POA: Insufficient documentation

## 2017-12-11 DIAGNOSIS — Z7984 Long term (current) use of oral hypoglycemic drugs: Secondary | ICD-10-CM | POA: Insufficient documentation

## 2017-12-11 DIAGNOSIS — Z7982 Long term (current) use of aspirin: Secondary | ICD-10-CM | POA: Insufficient documentation

## 2017-12-11 DIAGNOSIS — I2510 Atherosclerotic heart disease of native coronary artery without angina pectoris: Secondary | ICD-10-CM | POA: Insufficient documentation

## 2017-12-11 DIAGNOSIS — I130 Hypertensive heart and chronic kidney disease with heart failure and stage 1 through stage 4 chronic kidney disease, or unspecified chronic kidney disease: Secondary | ICD-10-CM | POA: Insufficient documentation

## 2017-12-11 DIAGNOSIS — R9431 Abnormal electrocardiogram [ECG] [EKG]: Secondary | ICD-10-CM | POA: Diagnosis not present

## 2017-12-11 DIAGNOSIS — Z87891 Personal history of nicotine dependence: Secondary | ICD-10-CM | POA: Insufficient documentation

## 2017-12-11 DIAGNOSIS — N183 Chronic kidney disease, stage 3 (moderate): Secondary | ICD-10-CM | POA: Insufficient documentation

## 2017-12-11 LAB — URINALYSIS, ROUTINE W REFLEX MICROSCOPIC
BILIRUBIN URINE: NEGATIVE
Glucose, UA: NEGATIVE mg/dL
Hgb urine dipstick: NEGATIVE
KETONES UR: NEGATIVE mg/dL
Nitrite: NEGATIVE
Protein, ur: 30 mg/dL — AB
SPECIFIC GRAVITY, URINE: 1.01 (ref 1.005–1.030)
pH: 5 (ref 5.0–8.0)

## 2017-12-11 LAB — CBG MONITORING, ED: Glucose-Capillary: 124 mg/dL — ABNORMAL HIGH (ref 65–99)

## 2017-12-11 LAB — CBC
HCT: 40.1 % (ref 36.0–46.0)
Hemoglobin: 13.3 g/dL (ref 12.0–15.0)
MCH: 33.7 pg (ref 26.0–34.0)
MCHC: 33.2 g/dL (ref 30.0–36.0)
MCV: 101.5 fL — AB (ref 78.0–100.0)
PLATELETS: 145 10*3/uL — AB (ref 150–400)
RBC: 3.95 MIL/uL (ref 3.87–5.11)
RDW: 14.9 % (ref 11.5–15.5)
WBC: 8.1 10*3/uL (ref 4.0–10.5)

## 2017-12-11 LAB — BASIC METABOLIC PANEL
Anion gap: 15 (ref 5–15)
BUN: 32 mg/dL — AB (ref 6–20)
CALCIUM: 8.7 mg/dL — AB (ref 8.9–10.3)
CO2: 18 mmol/L — ABNORMAL LOW (ref 22–32)
Chloride: 104 mmol/L (ref 101–111)
Creatinine, Ser: 1.98 mg/dL — ABNORMAL HIGH (ref 0.44–1.00)
GFR calc Af Amer: 29 mL/min — ABNORMAL LOW (ref >60)
GFR, EST NON AFRICAN AMERICAN: 25 mL/min — AB (ref >60)
GLUCOSE: 126 mg/dL — AB (ref 65–99)
POTASSIUM: 4 mmol/L (ref 3.5–5.1)
SODIUM: 137 mmol/L (ref 135–145)

## 2017-12-11 NOTE — ED Triage Notes (Signed)
Pt. Stated, I was concerned about my sugar dropping ow to 69 earlier.

## 2017-12-11 NOTE — ED Provider Notes (Signed)
[facility] EMERGENCY DEPARTMENT Provider Note   CSN: [id number] Arrival date & time: 12/11/17  1128     History   Chief Complaint Chief Complaint  Patient presents with  . Hypoglycemia    HPI Rachel Oneill is a 67 y.o. female.  HPI   Patient is here primarily because she thought her blood sugar was dropping too much this morning.  She noticed that it was 114 when she awoke and later after eating it was 68.  On the way here for evaluation for she felt a little dizzy while walking, in the hot parking lot.  She has been eating well the last few days.  She denies fever, chills, cough, shortness of breath, chest pain, back pain, abdominal pain, focal weakness or paresthesia.  She has been taking her usual medications as directed.  There are no other known modifying factors.   Past Medical History:  Diagnosis Date  . Bronchitis   . CHF (congestive heart failure) (HCC)   . Hypertension   . Pneumonia    2008ish  . Uncontrolled diabetes mellitus with complications (HCC)    Type II    Patient Active Problem List   Diagnosis Date Noted  . Ischemic cardiomyopathy 12/29/2016  . Chronic kidney disease, stage 3, mod decreased GFR (HCC) 08/07/2016  . Vitamin D deficiency 08/07/2016  . AAA (abdominal aortic aneurysm) without rupture (HCC) 11/04/2015  . PAD (peripheral artery disease) (HCC) 11/04/2015  . Chronic systolic heart failure (HCC) 10/11/2013  . CAD (coronary artery disease) of artery bypass graft 07/12/2013  . Hyperlipidemia LDL goal <100 07/12/2013  . Essential hypertension 07/12/2013  . Type 2 diabetes with nephropathy (HCC) 07/12/2013  . Mitral regurgitation 07/12/2013    Past Surgical History:  Procedure Laterality Date  . CARDIAC SURGERY     double bypass 2007  . CORONARY ARTERY BYPASS GRAFT    . ICD IMPLANT N/A 12/29/2016   Procedure: ICD Implant;  Surgeon: Regan Lemming, MD;  Location: [facility] INVASIVE CV LAB;  Service: Cardiovascular;   Laterality: N/A;  . LIGATION OF ARTERIOVENOUS  FISTULA Left 12/08/2016   Procedure: LIGATION OF Left arm ARTERIOVENOUS  FISTULA;  Surgeon: Sherren Kerns, MD;  Location: [facility] OR;  Service: Vascular;  Laterality: Left;     OB History   None      Home Medications    Prior to Admission medications   Medication Sig Start Date End Date Taking? Authorizing Provider  albuterol (PROVENTIL HFA;VENTOLIN HFA) 108 (90 Base) MCG/ACT inhaler Inhale 1-2 puffs into the lungs every 6 (six) hours as needed for wheezing or shortness of breath. 10/06/17  Yes Rise Mu, PA-C  amiodarone (PACERONE) 200 MG tablet Take 200 mg by mouth daily.   Yes [provider]  aspirin 81 MG chewable tablet Chew 1 tablet (81 mg total) by mouth daily. 07/05/13  Yes Richarda Overlie, MD  atorvastatin (LIPITOR) 80 MG tablet Take 1 tablet (80 mg total) by mouth daily. 10/10/13  Yes Richarda Overlie, MD  ENTRESTO 97-103 MG Take 1 tablet by mouth 2 (two) times daily.  01/24/16  Yes [provider]  furosemide (LASIX) 20 MG tablet Take 20 mg by mouth 2 (two) times daily.    Yes [provider]  glimepiride (AMARYL) 2 MG tablet Take 2 mg by mouth daily with breakfast.   Yes [provider]  KLOR-CON M20 20 MEQ tablet TAKE ONE TABLET BY MOUTH WHEN YOU TAKE LASIX Patient taking differently: TAKE  BY MOUTH WHEN YOU TAKE LASIX 03/12/17  Yes Henson, Vickie L, NP-C  metFORMIN (GLUCOPHAGE) 500 MG tablet TAKE ONE TABLET BY MOUTH TWICE DAILY WITH A MEAL Patient taking differently: TAKE 1000mg  BY MOUTH TWICE DAILY WITH A MEAL 04/13/17  Yes Henson, Vickie L, NP-C  metoprolol tartrate (LOPRESSOR) 25 MG tablet Take 30 mg by mouth at bedtime. 11/04/17  Yes [provider]  torsemide (DEMADEX) 20 MG tablet Take 20 mg by mouth daily. 12/04/17  Yes [provider]  Vitamin D, Cholecalciferol, 1000 units TABS Take 2,000 Units by mouth every other day.   Yes [provider]  albuterol  (PROVENTIL HFA;VENTOLIN HFA) 108 (90 Base) MCG/ACT inhaler INHALE 1 PUFF BY MOUTH EVERY 6 HOURS AS NEEDED FOR WHEEZING OR SHORTNESS OR BREATH Patient not taking: Reported on 12/11/2017 10/06/17   Hetty Blend L, NP-C  benzonatate (TESSALON) 100 MG capsule Take 1 capsule (100 mg total) by mouth every 8 (eight) hours. Patient not taking: Reported on 12/03/2017 10/06/17   Demetrios Loll T, PA-C  Glucose Blood (BLOOD GLUCOSE TEST STRIPS) STRP Test twice a day. Pt uses one touch ultra mini meter 12/08/17   Avanell Shackleton, NP-C    Family History Family History  Problem Relation Age of Onset  . Diabetes Mother   . Brain cancer Mother   . Cancer Father        Neck  . Colon cancer Neg Hx   . Stomach cancer Neg Hx     Social History Social History   Tobacco Use  . Smoking status: Former Smoker    Years: 30.00    Types: Cigarettes    Last attempt to quit: 05/31/2006    Years since quitting: 11.5  . Smokeless tobacco: Never Used  Substance Use Topics  . Alcohol use: No  . Drug use: No     Allergies   Shellfish allergy   Review of Systems Review of Systems  All other systems reviewed and are negative.    Physical Exam Updated Vital Signs There were no vitals taken for this visit.  Physical Exam  Constitutional: She is oriented to person, place, and time. She appears well-developed. No distress.  Elderly, frail  HENT:  Head: Normocephalic and atraumatic.  Eyes: Pupils are equal, round, and reactive to light. Conjunctivae and EOM are normal.  Neck: Normal range of motion and phonation normal. Neck supple.  Cardiovascular: Normal rate and regular rhythm.  Pulmonary/Chest: Effort normal and breath sounds normal. She exhibits no tenderness.  Abdominal: Soft. She exhibits no distension. There is no tenderness. There is no guarding.  Musculoskeletal: Normal range of motion.  Neurological: She is alert and oriented to person, place, and time. She exhibits normal muscle tone.    Skin: Skin is warm and dry.  Psychiatric: She has a normal mood and affect. Her behavior is normal. Judgment and thought content normal.  Nursing note and vitals reviewed.    ED Treatments / Results  Labs (all labs ordered are listed, but only abnormal results are displayed) Labs Reviewed  CBC - Abnormal; Notable for the following components:      Result Value   MCV 101.5 (*)    Platelets 145 (*)    All other components within normal limits  BASIC METABOLIC PANEL - Abnormal; Notable for the following components:   CO2 18 (*)    Glucose, Bld 126 (*)    BUN 32 (*)    Creatinine, Ser 1.98 (*)    Calcium 8.7 (*)  GFR calc non Af Amer 25 (*)    GFR calc Af Amer 29 (*)    All other components within normal limits  URINALYSIS, ROUTINE W REFLEX MICROSCOPIC - Abnormal; Notable for the following components:   APPearance HAZY (*)    Protein, ur 30 (*)    Leukocytes, UA TRACE (*)    Bacteria, UA RARE (*)    All other components within normal limits  CBG MONITORING, ED - Abnormal; Notable for the following components:   Glucose-Capillary 124 (*)    All other components within normal limits    EKG EKG Interpretation  Date/Time:  Saturday Dec 11 2017 11:41:30 EDT Ventricular Rate:  72 PR Interval:  194 QRS Duration: 80 QT Interval:  468 QTC Calculation: 512 R Axis:   63 Text Interpretation:  Sinus rhythm with sinus arrhythmia with occasional Premature ventricular complexes Nonspecific T wave abnormality Prolonged QT Abnormal ECG Since last tracing QT has lengthened Confirmed by Mancel Bale [phone]) on 12/11/2017 12:27:44 PM   Radiology No results found.  Procedures Procedures (including critical care time)  Medications Ordered in ED Medications - No data to display   Initial Impression / Assessment and Plan / ED Course  I have reviewed the triage vital signs and the nursing notes.  Pertinent labs & imaging results that were available during my care of the patient  were reviewed by me and considered in my medical decision making (see chart for details).  Clinical Course as of Dec 12 1598  Sat Dec 11, 2017  1557 Normal except elevated protein, squamous cells and rare bacteria.  Urinalysis, Routine w reflex microscopic(!) [EW]  1557 Normal except low CO2 18, elevated BUN and creatinine, low calcium, low GFR  Basic metabolic panel(!) [EW]  1558 Trending of renal function, gradually worse over the last 8 months.   [EW]    Clinical Course User Index [EW] Mancel Bale, MD     No data found.  4:13 PM Reevaluation with update and discussion. After initial assessment and treatment, an updated evaluation reveals she is comfortable at this time and is tolerated eating lunch.  She wants more information on diabetes diet.  She has a follow-up appointment with her cardiologist in 6 days.  Findings discussed with patient and family, all questions answered. Mancel Bale   Medical decision making-nonspecific complaints, with fairly reassuring evaluation.  Mild renal insufficiency, patient on Entresto.  Gradually worse renal function over the last 8 months.  Patient likely needs medication adjustment and ongoing management.  She does have a history of prior chronic kidney disease.  She was seen by her PCP on 12/03/2017, and her metformin was increased at that time.  She is also on glimepiride which was recently started.  Doubt severe bacterial infection, metabolic instability or impending vascular collapse.  Nursing Notes Reviewed/ Care Coordinated Applicable Imaging Reviewed Interpretation of Laboratory Data incorporated into ED treatment  The patient appears reasonably screened and/or stabilized for discharge and I doubt any other medical condition or other [facility] requiring further screening, evaluation, or treatment in the ED at this time prior to discharge.  Plan: Home Medications-continue current medications; Home Treatments-increase oral fluids, water, strict  diabetic diet; return here if the recommended treatment, does not improve the symptoms; Recommended follow up-PCP as needed.  Cardiology follow-up as scheduled.    Final Clinical Impressions(s) / ED Diagnoses   Final diagnoses:  Renal insufficiency    ED Discharge Orders    None  Mancel Bale, MD 12/11/17 [phone]

## 2017-12-11 NOTE — ED Notes (Signed)
Ate [place] sandwich and drink.  Tolerated well.

## 2017-12-11 NOTE — Discharge Instructions (Addendum)
The testing for hypoglycemia, was reassuring.  It is important to continue following a diabetic diet to ensure that you continue to do well.  We are providing some information on this document to help you with your diabetes diet.  Your kidney function was a little bit worse, possibly related to nutritional issues versus your Entresto medication.  Try to drink up to a liter of water each day, until you follow-up with your cardiologist next week.  Return here, if needed, for problems.

## 2017-12-11 NOTE — ED Notes (Signed)
Got patient into a gown on the monitor patient is resting with call bell in reach 

## 2017-12-17 ENCOUNTER — Encounter: Payer: Self-pay | Admitting: Family Medicine

## 2017-12-17 ENCOUNTER — Ambulatory Visit (INDEPENDENT_AMBULATORY_CARE_PROVIDER_SITE_OTHER): Payer: Medicare Other | Admitting: Family Medicine

## 2017-12-17 VITALS — BP 100/62 | HR 91 | Temp 97.4°F | Resp 16

## 2017-12-17 DIAGNOSIS — E1121 Type 2 diabetes mellitus with diabetic nephropathy: Secondary | ICD-10-CM | POA: Diagnosis not present

## 2017-12-17 DIAGNOSIS — E1165 Type 2 diabetes mellitus with hyperglycemia: Secondary | ICD-10-CM | POA: Diagnosis not present

## 2017-12-17 DIAGNOSIS — N183 Chronic kidney disease, stage 3 unspecified: Secondary | ICD-10-CM

## 2017-12-17 DIAGNOSIS — I10 Essential (primary) hypertension: Secondary | ICD-10-CM | POA: Diagnosis not present

## 2017-12-17 DIAGNOSIS — I739 Peripheral vascular disease, unspecified: Secondary | ICD-10-CM

## 2017-12-17 LAB — POCT GLYCOSYLATED HEMOGLOBIN (HGB A1C): Hemoglobin A1C: 11.4

## 2017-12-17 MED ORDER — INSULIN GLARGINE 100 UNIT/ML SOLOSTAR PEN: 10.0000 [IU] | PEN_INJECTOR | Freq: Every day | SUBCUTANEOUS | 0 refills | Status: DC

## 2017-12-17 MED ORDER — LINAGLIPTIN 5 MG PO TABS: 5.0000 mg | ORAL_TABLET | Freq: Every day | ORAL | 0 refills | Status: DC

## 2017-12-17 NOTE — Progress Notes (Signed)
   Subjective:    Patient ID: Rachel Oneill, female    DOB: [birth year], 67 y.o.   MRN: [id number]  HPI Chief Complaint  Patient presents with  . 2 week follow-up    sugars running around 66-210. this morning was 98 and then 79.   She is here to follow up on recently elevated Hgb A1c >14. Her metformin was increased and glimepride added. She has not felt well since. Her blood sugars have been erratic with lows in the 60s and highs in the 200 range.  Prior to 2 weeks ago, her diabetes had been well controlled in the 7 range and renal function was stable.   Denies fever, chills, dizziness, chest pain, palpitations, shortness of breath, abdominal pain, N/V/D, urinary symptoms.   Reviewed allergies, medications, past medical, surgical, family, and social history.    Review of Systems Pertinent positives and negatives in the history of present illness.     Objective:   Physical Exam BP 100/62   Pulse 91   Temp (!) 97.4 F (36.3 C) (Oral)   Resp 16   SpO2 99%   Alert and in no distress. Pharyngeal area is normal. Neck is supple without adenopathy or thyromegaly. Cardiac exam shows a regular sinus rhythm without murmurs or gallops. Lungs are clear to auscultation, normal work of breathing.        Assessment & Plan:  Uncontrolled type 2 diabetes mellitus with hyperglycemia (HCC) - Plan: Comprehensive metabolic panel, POCT glycosylated hemoglobin (Hb A1C), Ambulatory referral to Endocrinology  PAD (peripheral artery disease) (HCC)  Essential hypertension - Plan: Comprehensive metabolic panel  Chronic kidney disease, stage 3, mod decreased GFR (HCC) - Plan: Ambulatory referral to Nephrology  Type 2 diabetes with nephropathy (HCC) - Plan: Comprehensive metabolic panel  Hemoglobin A1c 11.4% which has improved from > 14 2 weeks ago. Due to worsening renal function we are stopping metformin and glimepiride.  We are starting her on Tradjenta and Lantus 10 units.  She will continue  keeping a close eye on her blood sugar.  Prior to 2 weeks ago her diabetes had been well controlled in the 7 range. Unclear as to what caused recent deterioration.  Referral to endocrinologist for assistance in managing her diabetes. Kidney function has been worsening over the past few months.  Referral to nephrologist.  Check labs and she will call me Monday to let me know how she is feeling and her blood sugars.

## 2017-12-17 NOTE — Patient Instructions (Addendum)
Stop the glimepride and Metformin.   Start Tradjenta and Lantus 10 units every evening. Continue checking your blood sugars 2-3 times daily.   I am referring you to the diabetes and kidney specialists.  Call me Monday and let me know how you are doing.

## 2017-12-18 LAB — COMPREHENSIVE METABOLIC PANEL
A/G RATIO: 1.6 (ref 1.2–2.2)
ALBUMIN: 4.2 g/dL (ref 3.6–4.8)
ALT: 13 IU/L (ref 0–32)
AST: 17 IU/L (ref 0–40)
Alkaline Phosphatase: 175 IU/L — ABNORMAL HIGH (ref 39–117)
BUN/Creatinine Ratio: 18 (ref 12–28)
BUN: 37 mg/dL — ABNORMAL HIGH (ref 8–27)
Bilirubin Total: 1.4 mg/dL — ABNORMAL HIGH (ref 0.0–1.2)
CALCIUM: 9.4 mg/dL (ref 8.7–10.3)
CO2: 21 mmol/L (ref 20–29)
CREATININE: 2.01 mg/dL — AB (ref 0.57–1.00)
Chloride: 104 mmol/L (ref 96–106)
GFR, EST AFRICAN AMERICAN: 29 mL/min/{1.73_m2} — AB (ref >59)
GFR, EST NON AFRICAN AMERICAN: 25 mL/min/{1.73_m2} — AB (ref >59)
GLOBULIN, TOTAL: 2.7 g/dL (ref 1.5–4.5)
Glucose: 50 mg/dL — ABNORMAL LOW (ref 65–99)
POTASSIUM: 4.3 mmol/L (ref 3.5–5.2)
SODIUM: 143 mmol/L (ref 134–144)
TOTAL PROTEIN: 6.9 g/dL (ref 6.0–8.5)

## 2017-12-20 ENCOUNTER — Emergency Department (HOSPITAL_COMMUNITY)
Admission: EM | Admit: 2017-12-20 | Discharge: 2017-12-21 | Disposition: A | Discharge status: Home / Self Care | Payer: Medicare Other | Attending: Emergency Medicine | Admitting: Emergency Medicine

## 2017-12-20 ENCOUNTER — Emergency Department (HOSPITAL_COMMUNITY): Payer: Medicare Other

## 2017-12-20 ENCOUNTER — Encounter (HOSPITAL_COMMUNITY): Payer: Self-pay | Admitting: Emergency Medicine

## 2017-12-20 DIAGNOSIS — I5022 Chronic systolic (congestive) heart failure: Secondary | ICD-10-CM | POA: Diagnosis not present

## 2017-12-20 DIAGNOSIS — Z79899 Other long term (current) drug therapy: Secondary | ICD-10-CM | POA: Diagnosis not present

## 2017-12-20 DIAGNOSIS — Z87891 Personal history of nicotine dependence: Secondary | ICD-10-CM | POA: Insufficient documentation

## 2017-12-20 DIAGNOSIS — E11649 Type 2 diabetes mellitus with hypoglycemia without coma: Secondary | ICD-10-CM | POA: Insufficient documentation

## 2017-12-20 DIAGNOSIS — E162 Hypoglycemia, unspecified: Secondary | ICD-10-CM

## 2017-12-20 DIAGNOSIS — M1000 Idiopathic gout, unspecified site: Secondary | ICD-10-CM

## 2017-12-20 DIAGNOSIS — N390 Urinary tract infection, site not specified: Secondary | ICD-10-CM | POA: Insufficient documentation

## 2017-12-20 DIAGNOSIS — I2510 Atherosclerotic heart disease of native coronary artery without angina pectoris: Secondary | ICD-10-CM | POA: Insufficient documentation

## 2017-12-20 DIAGNOSIS — Z7982 Long term (current) use of aspirin: Secondary | ICD-10-CM | POA: Diagnosis not present

## 2017-12-20 DIAGNOSIS — M7989 Other specified soft tissue disorders: Secondary | ICD-10-CM | POA: Diagnosis not present

## 2017-12-20 DIAGNOSIS — N183 Chronic kidney disease, stage 3 (moderate): Secondary | ICD-10-CM | POA: Insufficient documentation

## 2017-12-20 DIAGNOSIS — E1122 Type 2 diabetes mellitus with diabetic chronic kidney disease: Secondary | ICD-10-CM | POA: Diagnosis not present

## 2017-12-20 DIAGNOSIS — M79671 Pain in right foot: Secondary | ICD-10-CM | POA: Diagnosis not present

## 2017-12-20 DIAGNOSIS — M10071 Idiopathic gout, right ankle and foot: Secondary | ICD-10-CM | POA: Insufficient documentation

## 2017-12-20 DIAGNOSIS — I130 Hypertensive heart and chronic kidney disease with heart failure and stage 1 through stage 4 chronic kidney disease, or unspecified chronic kidney disease: Secondary | ICD-10-CM | POA: Diagnosis not present

## 2017-12-20 DIAGNOSIS — Z951 Presence of aortocoronary bypass graft: Secondary | ICD-10-CM | POA: Diagnosis not present

## 2017-12-20 DIAGNOSIS — Z794 Long term (current) use of insulin: Secondary | ICD-10-CM | POA: Insufficient documentation

## 2017-12-20 LAB — CBC WITH DIFFERENTIAL/PLATELET
BASOS ABS: 0 10*3/uL (ref 0.0–0.1)
Basophils Relative: 0 %
EOS PCT: 1 %
Eosinophils Absolute: 0.1 10*3/uL (ref 0.0–0.7)
HEMATOCRIT: 44 % (ref 36.0–46.0)
Hemoglobin: 14.9 g/dL (ref 12.0–15.0)
LYMPHS ABS: 0.9 10*3/uL (ref 0.7–4.0)
LYMPHS PCT: 10 %
MCH: 34.7 pg — AB (ref 26.0–34.0)
MCHC: 33.9 g/dL (ref 30.0–36.0)
MCV: 102.3 fL — AB (ref 78.0–100.0)
MONO ABS: 0.6 10*3/uL (ref 0.1–1.0)
Monocytes Relative: 7 %
NEUTROS ABS: 7.2 10*3/uL (ref 1.7–7.7)
Neutrophils Relative %: 82 %
Platelets: 214 10*3/uL (ref 150–400)
RBC: 4.3 MIL/uL (ref 3.87–5.11)
RDW: 15 % (ref 11.5–15.5)
WBC: 8.8 10*3/uL (ref 4.0–10.5)

## 2017-12-20 LAB — COMPREHENSIVE METABOLIC PANEL
ALK PHOS: 178 U/L — AB (ref 38–126)
ALT: 22 U/L (ref 14–54)
ANION GAP: 14 (ref 5–15)
AST: 35 U/L (ref 15–41)
Albumin: 3.8 g/dL (ref 3.5–5.0)
BILIRUBIN TOTAL: 1 mg/dL (ref 0.3–1.2)
BUN: 44 mg/dL — ABNORMAL HIGH (ref 6–20)
CALCIUM: 9.6 mg/dL (ref 8.9–10.3)
CO2: 24 mmol/L (ref 22–32)
Chloride: 105 mmol/L (ref 101–111)
Creatinine, Ser: 1.65 mg/dL — ABNORMAL HIGH (ref 0.44–1.00)
GFR calc Af Amer: 36 mL/min — ABNORMAL LOW (ref >60)
GFR, EST NON AFRICAN AMERICAN: 31 mL/min — AB (ref >60)
Glucose, Bld: 90 mg/dL (ref 65–99)
POTASSIUM: 4.3 mmol/L (ref 3.5–5.1)
Sodium: 143 mmol/L (ref 135–145)
TOTAL PROTEIN: 7.9 g/dL (ref 6.5–8.1)

## 2017-12-20 LAB — URINALYSIS, ROUTINE W REFLEX MICROSCOPIC
Bilirubin Urine: NEGATIVE
GLUCOSE, UA: NEGATIVE mg/dL
KETONES UR: NEGATIVE mg/dL
NITRITE: NEGATIVE
PH: 5 (ref 5.0–8.0)
Protein, ur: 30 mg/dL — AB
SPECIFIC GRAVITY, URINE: 1.017 (ref 1.005–1.030)

## 2017-12-20 LAB — URIC ACID: URIC ACID, SERUM: 12.2 mg/dL — AB (ref 2.3–6.6)

## 2017-12-20 LAB — CBG MONITORING, ED
Glucose-Capillary: 63 mg/dL — ABNORMAL LOW (ref 65–99)
Glucose-Capillary: 88 mg/dL (ref 65–99)

## 2017-12-20 MED ORDER — CEPHALEXIN 500 MG PO CAPS: 500.0000 mg | ORAL_CAPSULE | Freq: Three times a day (TID) | ORAL | 0 refills | Status: DC

## 2017-12-20 MED ORDER — CEFTRIAXONE SODIUM 1 G IJ SOLR
1.0000 g | Freq: Once | INTRAMUSCULAR | Status: DC
  Filled 2017-12-20: qty 10

## 2017-12-20 MED ORDER — CEPHALEXIN 500 MG PO CAPS
500.0000 mg | ORAL_CAPSULE | Freq: Once | ORAL | Status: AC
  Administered 2017-12-20: 500 mg via ORAL
  Filled 2017-12-20: qty 1

## 2017-12-20 MED ORDER — COLCHICINE 0.6 MG PO TABS: 0.6000 mg | ORAL_TABLET | Freq: Every day | ORAL | 0 refills | Status: DC

## 2017-12-20 NOTE — ED Notes (Signed)
Attempt x 3 at PIV unsuccessful-patient has very tortuous/hard veins with multiple valves

## 2017-12-20 NOTE — ED Triage Notes (Signed)
Pt reports for about week was taking metformin but taking off and put on something else, but was told to stop until can figure off out what going on with blood sugars for being high then dropping low.  Reports only had some peanut butter about hour ago.

## 2017-12-20 NOTE — ED Provider Notes (Signed)
[facility]-EMERGENCY DEPT Provider Note   CSN: [id number] Arrival date & time: 12/20/17  1851     History   Chief Complaint Chief Complaint  Patient presents with  . Hypoglycemia    HPI Rachel Oneill is a 67 y.o. female.  HPI Patient has had episodic hypoglycemia starting 9 days ago.  Recently had metformin stopped and patient was started on linagliptin.  Patient states that despite no change in her diet, her blood pressure dropped into the 30s today.  She was asymptomatic with this.  No dizziness or visual changes.  No fever or chills.  No shortness of breath or cough.  Patient endorses right toe pain for the past for 5 days.  States she is had increased swelling in the area.  Denies any injury. Past Medical History:  Diagnosis Date  . Bronchitis   . CHF (congestive heart failure) (HCC)   . Hypertension   . Pneumonia    2008ish  . Uncontrolled diabetes mellitus with complications (HCC)    Type II    Patient Active Problem List   Diagnosis Date Noted  . Ischemic cardiomyopathy 12/29/2016  . Chronic kidney disease, stage 3, mod decreased GFR (HCC) 08/07/2016  . Vitamin D deficiency 08/07/2016  . AAA (abdominal aortic aneurysm) without rupture (HCC) 11/04/2015  . PAD (peripheral artery disease) (HCC) 11/04/2015  . Chronic systolic heart failure (HCC) 10/11/2013  . CAD (coronary artery disease) of artery bypass graft 07/12/2013  . Hyperlipidemia LDL goal <100 07/12/2013  . Essential hypertension 07/12/2013  . Type 2 diabetes with nephropathy (HCC) 07/12/2013  . Mitral regurgitation 07/12/2013    Past Surgical History:  Procedure Laterality Date  . CARDIAC SURGERY     double bypass 2007  . CORONARY ARTERY BYPASS GRAFT    . ICD IMPLANT N/A 12/29/2016   Procedure: ICD Implant;  Surgeon: Regan Lemming, MD;  Location: [facility] INVASIVE CV LAB;  Service: Cardiovascular;  Laterality: N/A;  . LIGATION OF ARTERIOVENOUS  FISTULA Left 12/08/2016   Procedure: LIGATION OF Left arm ARTERIOVENOUS  FISTULA;  Surgeon: Sherren Kerns, MD;  Location: [facility] OR;  Service: Vascular;  Laterality: Left;     OB History   None      Home Medications    Prior to Admission medications   Medication Sig Start Date End Date Taking? Authorizing Provider  albuterol (PROVENTIL HFA;VENTOLIN HFA) 108 (90 Base) MCG/ACT inhaler INHALE 1 PUFF BY MOUTH EVERY 6 HOURS AS NEEDED FOR WHEEZING OR SHORTNESS OR BREATH 10/06/17  Yes Henson, Vickie L, NP-C  amiodarone (PACERONE) 200 MG tablet Take 200 mg by mouth daily.   Yes [provider]  aspirin 81 MG chewable tablet Chew 1 tablet (81 mg total) by mouth daily. 07/05/13  Yes Richarda Overlie, MD  atorvastatin (LIPITOR) 80 MG tablet Take 1 tablet (80 mg total) by mouth daily. 10/10/13  Yes Richarda Overlie, MD  ENTRESTO 97-103 MG Take 1 tablet by mouth 2 (two) times daily.  01/24/16  Yes [provider]  furosemide (LASIX) 20 MG tablet Take 20 mg by mouth 2 (two) times daily.    Yes [provider]  KLOR-CON M20 20 MEQ tablet TAKE ONE TABLET BY MOUTH WHEN YOU TAKE LASIX Patient taking differently: Take one-half a tablet with torsemide 03/12/17  Yes Henson, Vickie L, NP-C  linagliptin (TRADJENTA) 5 MG TABS tablet Take 1 tablet (5 mg total) by mouth daily. 12/17/17  Yes Henson, Vickie L, NP-C  linagliptin (TRADJENTA) 5 MG TABS  tablet Take 1 tablet (5 mg total) by mouth daily. 12/17/17  Yes Henson, Vickie L, NP-C  torsemide (DEMADEX) 20 MG tablet Take 20 mg by mouth daily. 12/04/17  Yes [provider]  Vitamin D, Cholecalciferol, 1000 units TABS Take 2,000 Units by mouth every other day.   Yes [provider]  cephALEXin (KEFLEX) 500 MG capsule Take 1 capsule (500 mg total) by mouth 3 (three) times daily. 12/20/17   Loren Racer, MD  colchicine 0.6 MG tablet Take 1 tablet (0.6 mg total) by mouth daily. 12/20/17   Loren Racer, MD  Glucose Blood (BLOOD GLUCOSE TEST STRIPS) STRP Test  twice a day. Pt uses one touch ultra mini meter 12/08/17   Henson, Vickie L, NP-C  Insulin Glargine (LANTUS SOLOSTAR) 100 UNIT/ML Solostar Pen Inject 10 Units into the skin daily at 10 pm. Patient not taking: Reported on 12/20/2017 12/17/17   Avanell Shackleton, NP-C    Family History Family History  Problem Relation Age of Onset  . Diabetes Mother   . Brain cancer Mother   . Cancer Father        Neck  . Colon cancer Neg Hx   . Stomach cancer Neg Hx     Social History Social History   Tobacco Use  . Smoking status: Former Smoker    Years: 30.00    Types: Cigarettes    Last attempt to quit: 05/31/2006    Years since quitting: 11.5  . Smokeless tobacco: Never Used  Substance Use Topics  . Alcohol use: No  . Drug use: No     Allergies   Shellfish allergy   Review of Systems Review of Systems  Constitutional: Negative for chills and fever.  HENT: Negative for sore throat and trouble swallowing.   Eyes: Negative for visual disturbance.  Respiratory: Negative for cough and shortness of breath.   Cardiovascular: Positive for leg swelling. Negative for chest pain and palpitations.  Gastrointestinal: Negative for abdominal pain, constipation, diarrhea, nausea and vomiting.  Genitourinary: Negative for dysuria, flank pain and frequency.  Musculoskeletal: Positive for arthralgias and joint swelling. Negative for back pain, myalgias, neck pain and neck stiffness.  Skin: Negative for rash and wound.  Neurological: Negative for dizziness, syncope, weakness, light-headedness, numbness and headaches.  Psychiatric/Behavioral: Negative for confusion.  All other systems reviewed and are negative.    Physical Exam Updated Vital Signs BP (!) 124/97 (BP Location: Right Arm)   Pulse 72   Temp 97.7 F (36.5 C) (Oral)   Resp (!) 21   SpO2 99%   Physical Exam  Constitutional: She is oriented to person, place, and time. She appears well-developed and well-nourished. No distress.  HENT:   Head: Normocephalic and atraumatic.  Mouth/Throat: Oropharynx is clear and moist. No oropharyngeal exudate.  Eyes: Pupils are equal, round, and reactive to light. EOM are normal.  Neck: Normal range of motion. Neck supple. No JVD present.  Cardiovascular: Normal rate and regular rhythm. Exam reveals no gallop and no friction rub.  No murmur heard. Pulmonary/Chest: Effort normal and breath sounds normal. No stridor. No respiratory distress. She has no wheezes. She has no rales. She exhibits no tenderness.  Abdominal: Soft. Bowel sounds are normal. There is no tenderness. There is no rebound and no guarding.  Musculoskeletal: Normal range of motion. She exhibits edema. She exhibits no tenderness.  Patient with warmth and tenderness to the right great toe.  There is diffuse dorsal right foot edema without tenderness.  No calf tenderness  or asymmetry.  Lymphadenopathy:    She has no cervical adenopathy.  Neurological: She is alert and oriented to person, place, and time.  5/5 motor in all extremities.  Sensation fully intact.  Skin: Skin is warm and dry. Capillary refill takes less than 2 seconds. No rash noted. She is not diaphoretic. No erythema.  Psychiatric: She has a normal mood and affect. Her behavior is normal.  Nursing note and vitals reviewed.    ED Treatments / Results  Labs (all labs ordered are listed, but only abnormal results are displayed) Labs Reviewed  CBC WITH DIFFERENTIAL/PLATELET - Abnormal; Notable for the following components:      Result Value   MCV 102.3 (*)    MCH 34.7 (*)    All other components within normal limits  COMPREHENSIVE METABOLIC PANEL - Abnormal; Notable for the following components:   BUN 44 (*)    Creatinine, Ser 1.65 (*)    Alkaline Phosphatase 178 (*)    GFR calc non Af Amer 31 (*)    GFR calc Af Amer 36 (*)    All other components within normal limits  URIC ACID - Abnormal; Notable for the following components:   Uric Acid, Serum 12.2 (*)     All other components within normal limits  URINALYSIS, ROUTINE W REFLEX MICROSCOPIC - Abnormal; Notable for the following components:   APPearance CLOUDY (*)    Hgb urine dipstick SMALL (*)    Protein, ur 30 (*)    Leukocytes, UA MODERATE (*)    Bacteria, UA RARE (*)    All other components within normal limits  CBG MONITORING, ED - Abnormal; Notable for the following components:   Glucose-Capillary 63 (*)    All other components within normal limits  URINE CULTURE  CBG MONITORING, ED    EKG None  Radiology Dg Foot Complete Right  Result Date: 12/20/2017 CLINICAL DATA:  Diabetic with pain in the great toe. EXAM: RIGHT FOOT COMPLETE - 3+ VIEW COMPARISON:  None. FINDINGS: Normal toe alignment. Mild arthritic changes at the MTP joint of the great toe with a small periarticular cyst. Question lucency of the distal metatarsal head, which can be seen with avascular necrosis/Freiberg's infraction. Nonspecific forefoot soft tissue swelling. IMPRESSION: Forefoot soft tissue swelling which is nonspecific. Arthritis at the metatarsal phalangeal joint of the great toe. Small marginal erosion which can relate to ordinary arthritis or can be seen with gout. Question lucency the metatarsal head of the great toe which could be seen in Freiberg's infraction. Electronically Signed   By: Paulina Fusi M.D.   On: 12/20/2017 20:39    Procedures Procedures (including critical care time)  Medications Ordered in ED Medications  cephALEXin (KEFLEX) capsule 500 mg (500 mg Oral Given 12/20/17 2314)     Initial Impression / Assessment and Plan / ED Course  I have reviewed the triage vital signs and the nursing notes.  Pertinent labs & imaging results that were available during my care of the patient were reviewed by me and considered in my medical decision making (see chart for details).    Patient maintaining adequate blood sugar.  Evidence of gout to the right foot as well as urinary tract infection.   This may be responsible for the patient's hypoglycemia.  She will hold her medications, start antibiotics and follow-up closely with her primary physician.  Return precautions have been given.   Final Clinical Impressions(s) / ED Diagnoses   Final diagnoses:  Lower urinary tract infectious disease  Acute idiopathic gout, unspecified site  Hypoglycemia    ED Discharge Orders        Ordered    cephALEXin (KEFLEX) 500 MG capsule  3 times daily     12/20/17 2357    colchicine 0.6 MG tablet  Daily     12/20/17 2357       Loren Racer, MD 12/20/17 2358

## 2017-12-21 ENCOUNTER — Ambulatory Visit: Payer: Medicare Other | Admitting: Family Medicine

## 2017-12-21 ENCOUNTER — Telehealth: Payer: Self-pay | Admitting: Internal Medicine

## 2017-12-21 LAB — CBG MONITORING, ED: Glucose-Capillary: 141 mg/dL — ABNORMAL HIGH (ref 65–99)

## 2017-12-21 NOTE — Telephone Encounter (Signed)
Pt called and states that her BS dropped to 30 this morning so she went to ER. She has a Gout flare up and UTI and is on antibiotics, they said because of these issues going on then that's why her BS is dropping. Her appt today has been cancelled with Dr. Susann Givens. When does the patient need to come back in to follow-up and when does she need to start taking her tradjenta medicine and insulin again.?

## 2017-12-21 NOTE — Telephone Encounter (Signed)
Also I called [facility] and there is no sooner appts. Pt will be put on the cancellation list

## 2017-12-21 NOTE — Telephone Encounter (Signed)
Pt states her bs is back up to 159 now.   Per vickie, she would like to hold off on her pill for now and only give 5 units of lantus if her bs is ranging around 150-180 but not over 200.  Pt is aware that if her bs are staying around 150- 180 then only to give 5 units of lantus tonight and hold pill for now. Pt will follow-up on Friday with vickie.

## 2017-12-22 LAB — URINE CULTURE: Culture: <10000 — AB

## 2017-12-24 ENCOUNTER — Ambulatory Visit (INDEPENDENT_AMBULATORY_CARE_PROVIDER_SITE_OTHER): Payer: Medicare Other | Admitting: Family Medicine

## 2017-12-24 ENCOUNTER — Encounter: Payer: Self-pay | Admitting: Family Medicine

## 2017-12-24 VITALS — BP 120/80 | HR 80 | Temp 97.5°F | Resp 18 | Wt 141.0 lb

## 2017-12-24 DIAGNOSIS — I5022 Chronic systolic (congestive) heart failure: Secondary | ICD-10-CM | POA: Diagnosis not present

## 2017-12-24 DIAGNOSIS — E162 Hypoglycemia, unspecified: Secondary | ICD-10-CM | POA: Diagnosis not present

## 2017-12-24 DIAGNOSIS — N3000 Acute cystitis without hematuria: Secondary | ICD-10-CM | POA: Diagnosis not present

## 2017-12-24 DIAGNOSIS — E1165 Type 2 diabetes mellitus with hyperglycemia: Secondary | ICD-10-CM

## 2017-12-24 LAB — GLUCOSE, POCT (MANUAL RESULT ENTRY): POC Glucose: 111 mg/dl — AB (ref 70–99)

## 2017-12-24 NOTE — Progress Notes (Signed)
Subjective:    Patient ID: Rachel Oneill, female    DOB: 1951/04/02, 67 y.o.   MRN: [id number]  HPI Chief Complaint  Patient presents with  . follow-up    follow-up on sugars. running 110-170   She is here to follow up on recent periods of hypoglycemia.  Previously her blood sugars have been well controlled with A1c's in the 7 range.  She presented approximately 3 weeks ago and saw one of my colleagues for hyperglycemia and her A1c was greater than 14.  At that time her metformin was increased and she was also placed on a sulfonylurea.  She started having hypoglycemia and when she returned to see me last week I discontinued the sulfonylurea as well as the metformin due to worsening renal disease.  We started her on Lantus 10 units in the evening and Tradjenta daily.  Her blood sugars continued being erratic and she had a low blood sugar in the 30s so she went to the emergency department. She was diagnosed with acute cystitis and gout in the ED and is currently undergoing treatment with colchicine and Keflex. Earlier this week I discontinued the Tradjenta and decreased her Lantus from 10 units to 5 units in the evening.  Today fasting FBS 114. Yesterday her blood sugars were in the 150s.  Denies any low blood sugars over the past 3 days. Giving herself 5 units of Lantus.  No other diabetes medications at this time.  States she is feeling much better.  Denies fever, chills, dizziness, chest pain, palpitations, shortness of breath, abdominal pain, nausea, vomiting, diarrhea, urinary symptoms.  I have referred her to endocrinology due to recent changes in her blood sugars. I also referred her to nephrology due to worsening kidney function.  She is followed closely by Dr. Jacinto Halim her cardiologist for hypertension, CHF, and history of MI.  Dr. Lucianne Muss on February 14 2018.  Nephrologist in July also.   Reviewed allergies, medications, past medical, surgical, family, and social history.   Review of  Systems Pertinent positives and negatives in the history of present illness.     Objective:   Physical Exam BP 120/80   Pulse 80   Temp (!) 97.5 F (36.4 C) (Oral)   Resp 18   Wt 141 lb (64 kg)   SpO2 94%   BMI 22.76 kg/m   Alert and in no distress. Pharyngeal area is normal. Neck is supple without adenopathy or thyromegaly. Cardiac exam shows a regular sinus rhythm without murmurs or gallops. Lungs are clear to auscultation. Normal work of breathing. Skin is warm and dry, no pallor. Bilateral LE with 2+ edema to mid shin. Palpable pulses.    Random BS -111     Assessment & Plan:  Hypoglycemia  Uncontrolled type 2 diabetes mellitus with hyperglycemia (HCC) - Plan: POCT glucose (manual entry)  Chronic systolic heart failure (HCC)  Acute cystitis without hematuria  Her blood sugar today in the office is 111.  Reports blood sugars at home have not been less than 114 over the past 2 days.  She is currently taking 5 units of Lantus in the evening and will continue this unless her blood sugar drops below 100 and then she will hold it that evening. She has an appointment with Dr. Lucianne Muss so that he may start taking over her diabetes.  It is unclear why her blood sugars have become so erratic. She also has an upcoming appointment with nephrology to evaluate kidney function. She has an  appointment next week with Dr. Jacinto Halim her cardiologist. Euvolemic today.  She will continue on antibiotics for UTI. She will follow-up with me next week as well.

## 2017-12-24 NOTE — Patient Instructions (Signed)
As long as you are seeing fasting blood sugars higher than 100, then you can continue taking the 5 units of Lantus. If you have any more low blood sugars then call and do not take the lantus that night.  Keep a close eye on your blood sugar. Eat small frequent meals.   I will see you back next week.

## 2017-12-28 ENCOUNTER — Telehealth: Payer: Self-pay | Admitting: Internal Medicine

## 2017-12-28 ENCOUNTER — Ambulatory Visit: Payer: Medicare Other | Admitting: Family Medicine

## 2017-12-28 NOTE — Telephone Encounter (Signed)
Pt is aware that she no showed her appt as she thought it was for 12/29/17 and not 12/28/17. DO NOT charge pt a no show fee

## 2017-12-28 NOTE — Telephone Encounter (Signed)

## 2017-12-28 NOTE — Telephone Encounter (Signed)
We will address this tomorrow. No fee.

## 2017-12-28 NOTE — Telephone Encounter (Signed)
Pt states that her sugars have been running good. 124 this morning and last night was 168. Pt is coming in tomorrow to follow-up on her blood sugars

## 2017-12-28 NOTE — Progress Notes (Deleted)
Rachel Oneill is a 67 y.o. female who presents for annual wellness visit and follow-up on chronic medical conditions.  She has the following concerns:   Immunization History  Administered Date(s) Administered  . Influenza-Unspecified 05/11/2015, 05/10/2016  . Pneumococcal Conjugate-13 08/06/2016   Last Pap smear: Last mammogram: Last colonoscopy: Last DEXA: Dentist: Ophtho: Exercise:  Other doctors caring for patient include:   Depression screen:  See questionnaire below.  Depression screen PHQ 2/9 08/06/2016  Decreased Interest 0  Down, Depressed, Hopeless 0  PHQ - 2 Score 0    Fall Risk Screen: see questionnaire below. Fall Risk  08/06/2016  Falls in the past year? No    ADL screen:  See questionnaire below Functional Status Survey:     End of Life Discussion:  Patient {ACTIONS; HAS/DOES NOT HAVE:19233} a living will and medical power of attorney  Review of Systems Constitutional: -fever, -chills, -sweats, -unexpected weight change, -anorexia, -fatigue Allergy: -sneezing, -itching, -congestion Dermatology: denies changing moles, rash, lumps, new worrisome lesions ENT: -runny nose, -ear pain, -sore throat, -hoarseness, -sinus pain, -teeth pain, -tinnitus, -hearing loss, -epistaxis Cardiology:  -chest pain, -palpitations, -edema, -orthopnea, -paroxysmal nocturnal dyspnea Respiratory: -cough, -shortness of breath, -dyspnea on exertion, -wheezing, -hemoptysis Gastroenterology: -abdominal pain, -nausea, -vomiting, -diarrhea, -constipation, -blood in stool, -changes in bowel movement, -dysphagia Hematology: -bleeding or bruising problems Musculoskeletal: -arthralgias, -myalgias, -joint swelling, -back pain, -neck pain, -cramping, -gait changes Ophthalmology: -vision changes, -eye redness, -itching, -discharge Urology: -dysuria, -difficulty urinating, -hematuria, -urinary frequency, -urgency, incontinence Neurology: -headache, -weakness, -tingling, -numbness, -speech  abnormality, -memory loss, -falls, -dizziness Psychology:  -depressed mood, -agitation, -sleep problems    PHYSICAL EXAM:  There were no vitals taken for this visit.  General Appearance: Alert, cooperative, no distress, appears stated age Head: Normocephalic, without obvious abnormality, atraumatic Eyes: PERRL, conjunctiva/corneas clear, EOM's intact, fundi benign Ears: Normal TM's and external ear canals Nose: Nares normal, mucosa normal, no drainage or sinus   tenderness Throat: Lips, mucosa, and tongue normal; teeth and gums normal Neck: Supple, no lymphadenopathy; thyroid: no enlargement/tenderness/nodules; no carotid bruit or JVD Back: Spine nontender, no curvature, ROM normal, no CVA tenderness Lungs: Clear to auscultation bilaterally without wheezes, rales or ronchi; respirations unlabored Chest Wall: No tenderness or deformity Heart: Regular rate and rhythm, S1 and S2 normal, no murmur, rub or gallop Breast Exam: No tenderness, masses, or nipple discharge or inversion. No axillary lymphadenopathy Abdomen: Soft, non-tender, nondistended, normoactive bowel sounds, no masses, no hepatosplenomegaly Genitalia: Normal external genitalia without lesions.  BUS and vagina normal; cervix without lesions, or cervical motion tenderness. No abnormal vaginal discharge.  Uterus and adnexa not enlarged, nontender, no masses.  Pap performed Rectal: Normal tone, no masses or tenderness; guaiac negative stool Extremities: No clubbing, cyanosis or edema Pulses: 2+ and symmetric all extremities Skin: Skin color, texture, turgor normal, no rashes or lesions Lymph nodes: Cervical, supraclavicular, and axillary nodes normal Neurologic: CNII-XII intact, normal strength, sensation and gait; reflexes 2+ and symmetric throughout Psych: Normal mood, affect, hygiene and grooming.  ASSESSMENT/PLAN: Medicare annual wellness visit, subsequent  Type 2 diabetes with nephropathy (HCC)  Chronic systolic heart  failure (HCC)  Essential hypertension  AAA (abdominal aortic aneurysm) without rupture (HCC)  Hyperlipidemia LDL goal <100  Vitamin D deficiency     Discussed monthly self breast exams and yearly mammograms; at least 30 minutes of aerobic activity at least 5 days/week and weight-bearing exercise 2x/week; proper sunscreen use reviewed; healthy diet, including goals of calcium and vitamin D intake and alcohol  recommendations (less than or equal to 1 drink/day) reviewed; regular seatbelt use; changing batteries in smoke detectors.  Immunization recommendations discussed.  Colonoscopy recommendations reviewed   Medicare Attestation I have personally reviewed: The patient's medical and social history Their use of alcohol, tobacco or illicit drugs Their current medications and supplements The patient's functional ability including ADLs,fall risks, home safety risks, cognitive, and hearing and visual impairment Diet and physical activities Evidence for depression or mood disorders  The patient's weight, height, and BMI have been recorded in the chart.  I have made referrals, counseling, and provided education to the patient based on review of the above and I have provided the patient with a written personalized care plan for preventive services.     Hetty Blend, NP-C   12/28/2017

## 2017-12-29 ENCOUNTER — Ambulatory Visit (INDEPENDENT_AMBULATORY_CARE_PROVIDER_SITE_OTHER): Payer: Medicare Other | Admitting: Family Medicine

## 2017-12-29 ENCOUNTER — Encounter: Payer: Self-pay | Admitting: Family Medicine

## 2017-12-29 VITALS — BP 120/70 | HR 64 | Wt 139.6 lb

## 2017-12-29 DIAGNOSIS — E790 Hyperuricemia without signs of inflammatory arthritis and tophaceous disease: Secondary | ICD-10-CM

## 2017-12-29 DIAGNOSIS — N3001 Acute cystitis with hematuria: Secondary | ICD-10-CM

## 2017-12-29 DIAGNOSIS — E1121 Type 2 diabetes mellitus with diabetic nephropathy: Secondary | ICD-10-CM

## 2017-12-29 LAB — POCT URINALYSIS DIP (PROADVANTAGE DEVICE)
BILIRUBIN UA: NEGATIVE
Blood, UA: NEGATIVE
GLUCOSE UA: NEGATIVE mg/dL
Ketones, POC UA: NEGATIVE mg/dL
LEUKOCYTES UA: NEGATIVE
NITRITE UA: NEGATIVE
Protein Ur, POC: 30 mg/dL — AB
Specific Gravity, Urine: 1.02
Urobilinogen, Ur: NEGATIVE
pH, UA: 6 (ref 5.0–8.0)

## 2017-12-29 NOTE — Progress Notes (Signed)
   Subjective:    Patient ID: Rachel Oneill, female    DOB: 1950/10/21, 67 y.o.   MRN: [id number]  HPI Chief Complaint  Patient presents with  . follow-up    follow-up on bs. 168 last night and 156 this morning. using 5 units of lantus at night.    She is here to follow up on recent episodes of hypoglycemia and erratic blood sugars.  We stopped Tradjenta and currently have her on 5 units of Lantus every evening.  She denies any low blood sugars.  Blood sugars are now in the 120s to 150s fasting.  We have discussed how she can titrate her Lantus according to her fasting blood sugars and she seems to understand this. She is making changes in her diet in regards to eating small frequent meals and adding protein to help stabilize her blood sugar.  She is inquiring about seeing a nutritionist to help her with what to eat.  She has gone online and done some research on her own regarding foods to help with her blood sugar.  She had a recent episode of a gout flare and was seen in the emergency department.  Uric acid level was checked and was significantly elevated.  She completed course of colchicine and symptoms have resolved.  She will need to have a repeat uric acid at her upcoming appointment.  She also had a recent UTI that was diagnosed in the emergency department.  She completed a course of antibiotics.  She denies any urinary symptoms.   Denies fever, chills, chest pain, shortness of breath, back pain, abdominal pain, nausea, vomiting, diarrhea.  Review of Systems Pertinent positives and negatives in the history of present illness.     Objective:   Physical Exam BP 120/70   Pulse 64   Wt 139 lb 9.6 oz (63.3 kg)   BMI 22.53 kg/m   Alert and oriented and in no acute distress.  Skin is warm and dry, no pallor.      Assessment & Plan:  Type 2 diabetes with nephropathy (HCC) - Plan: Microalbumin / creatinine urine ratio, Amb ref to Medical Nutrition Therapy-MNT  Elevated uric acid  in blood  Acute cystitis with hematuria - Plan: POCT Urinalysis DIP (Proadvantage Device)  Blood sugars are more stable.  Discussed healthy foods to eat to help with her blood sugar.  Referral to an ENT today. We will have her continue on 5 units of Lantus every evening and she is aware that she can titrate depending on her blood sugar levels. She has an appointment with endocrinology in July. Gout and UTI symptoms have all resolved. She reports feeling much better overall.  She will follow-up with me in 2 weeks for  Medicare annual wellness visit as well as a follow-up on diabetes.  We will check a uric acid level at her next appointment and determine whether she needs to be on maintenance medication for gout.  We do need taking the consideration CKD.  She will see nephrology in July for consult and to establish care.

## 2017-12-29 NOTE — Patient Instructions (Addendum)
Finish your antibiotic but your urine does look better today.    If your fasting blood sugar goes up higher than 160 then increase your Lantus that evening to 7 units.   Keep a close eye on your blood sugars. If you are seeing any low readings then call me and stop the lantus.   Look at the American Diabetes Association website and go to the nutrition section to look at food recommendations.  I am referring you to a medical nutritionist to help you with diabetes and nutrition.

## 2017-12-30 LAB — MICROALBUMIN / CREATININE URINE RATIO
CREATININE, UR: 109.2 mg/dL
MICROALB/CREAT RATIO: 176.7 mg/g{creat} — AB (ref 0.0–30.0)
MICROALBUM., U, RANDOM: 193 ug/mL

## 2017-12-31 DIAGNOSIS — I2510 Atherosclerotic heart disease of native coronary artery without angina pectoris: Secondary | ICD-10-CM | POA: Diagnosis not present

## 2017-12-31 DIAGNOSIS — Z95810 Presence of automatic (implantable) cardiac defibrillator: Secondary | ICD-10-CM | POA: Diagnosis not present

## 2017-12-31 DIAGNOSIS — I714 Abdominal aortic aneurysm, without rupture: Secondary | ICD-10-CM | POA: Diagnosis not present

## 2017-12-31 DIAGNOSIS — I5043 Acute on chronic combined systolic (congestive) and diastolic (congestive) heart failure: Secondary | ICD-10-CM | POA: Diagnosis not present

## 2018-01-06 ENCOUNTER — Encounter: Payer: Self-pay | Admitting: Internal Medicine

## 2018-01-06 NOTE — Telephone Encounter (Signed)
Letter not sent.

## 2018-01-10 ENCOUNTER — Telehealth: Payer: Self-pay | Admitting: Family Medicine

## 2018-01-10 DIAGNOSIS — I714 Abdominal aortic aneurysm, without rupture: Secondary | ICD-10-CM | POA: Diagnosis not present

## 2018-01-10 DIAGNOSIS — E1151 Type 2 diabetes mellitus with diabetic peripheral angiopathy without gangrene: Secondary | ICD-10-CM | POA: Diagnosis not present

## 2018-01-10 DIAGNOSIS — I2510 Atherosclerotic heart disease of native coronary artery without angina pectoris: Secondary | ICD-10-CM | POA: Diagnosis not present

## 2018-01-10 DIAGNOSIS — I5043 Acute on chronic combined systolic (congestive) and diastolic (congestive) heart failure: Secondary | ICD-10-CM | POA: Diagnosis not present

## 2018-01-10 NOTE — Telephone Encounter (Signed)
Pt dropped off handicap placard to be filled out put in your folder please call at [phone] when ready to be picked up

## 2018-01-11 ENCOUNTER — Ambulatory Visit: Payer: Medicare Other | Admitting: Family Medicine

## 2018-01-18 DIAGNOSIS — Z006 Encounter for examination for normal comparison and control in clinical research program: Secondary | ICD-10-CM | POA: Diagnosis not present

## 2018-01-18 DIAGNOSIS — I2510 Atherosclerotic heart disease of native coronary artery without angina pectoris: Secondary | ICD-10-CM | POA: Diagnosis not present

## 2018-01-18 DIAGNOSIS — I714 Abdominal aortic aneurysm, without rupture: Secondary | ICD-10-CM | POA: Diagnosis not present

## 2018-01-18 DIAGNOSIS — I5043 Acute on chronic combined systolic (congestive) and diastolic (congestive) heart failure: Secondary | ICD-10-CM | POA: Diagnosis not present

## 2018-01-27 ENCOUNTER — Telehealth: Payer: Self-pay | Admitting: Internal Medicine

## 2018-01-27 NOTE — Telephone Encounter (Signed)
Left message for pt to call back and schedule AWV

## 2018-01-31 ENCOUNTER — Other Ambulatory Visit: Payer: Self-pay

## 2018-01-31 ENCOUNTER — Telehealth: Payer: Self-pay | Admitting: Family Medicine

## 2018-01-31 MED ORDER — BLOOD GLUCOSE TEST VI STRP: ORAL_STRIP | 1 refills | Status: AC

## 2018-01-31 NOTE — Telephone Encounter (Signed)
Pt called she needs rx sent to [facility] For her test strips   Machine is [facility]

## 2018-01-31 NOTE — Telephone Encounter (Signed)
Called pt and pt says she was unware of only testing BID and has used all her test strip. Sent in new script and advised pt that her insurance may say it is to soon. KH

## 2018-02-01 ENCOUNTER — Encounter: Payer: Medicare Other | Attending: Family Medicine | Admitting: *Deleted

## 2018-02-01 DIAGNOSIS — E1121 Type 2 diabetes mellitus with diabetic nephropathy: Secondary | ICD-10-CM | POA: Diagnosis not present

## 2018-02-01 DIAGNOSIS — Z713 Dietary counseling and surveillance: Secondary | ICD-10-CM | POA: Diagnosis not present

## 2018-02-01 NOTE — Progress Notes (Signed)
Diabetes Self-Management Education  Visit Type: First/Initial  Appt. Start Time: 1530 Appt. End Time: 1630  02/01/2018  [name], identified by name and date of birth, is a 67 y.o. female with a diagnosis of Diabetes: Type 2. Patient states she has Googled to find out what she can eat with Diabetes but still has questions. She is currently working packing boxes for one more week. She walks a lot with her work, but would like to try water exercises too. She has cut back on carbohydrate types of food lately. She ran out of strips when she tested 3-4 times a day, so will speak to her MD about getting more.   ASSESSMENT  There were no vitals taken for this visit. There is no height or weight on file to calculate BMI.  Diabetes Self-Management Education - 02/01/18 1525      Visit Information   Visit Type  First/Initial      Initial Visit   Diabetes Type  Type 2    Are you currently following a meal plan?  No    Are you taking your medications as prescribed?  Yes      Health Coping   How would you rate your overall health?  Fair      Psychosocial Assessment   Patient Belief/Attitude about Diabetes  Motivated to manage diabetes    Self-care barriers  None    Self-management support  Doctor's office    Other persons present  Patient    Patient Concerns  Nutrition/Meal planning    Special Needs  None    Learning Readiness  Change in progress    What is the last grade level you completed in school?  12      Pre-Education Assessment   Patient understands the diabetes disease and treatment process.  Needs Instruction    Patient understands incorporating nutritional management into lifestyle.  Needs Instruction    Patient undertands incorporating physical activity into lifestyle.  Needs Instruction    Patient understands using medications safely.  Needs Instruction    Patient understands monitoring blood glucose, interpreting and using results  Needs Instruction    Patient  understands prevention, detection, and treatment of acute complications.  Needs Instruction    Patient understands prevention, detection, and treatment of chronic complications.  Needs Instruction    Patient understands how to develop strategies to address psychosocial issues.  Needs Instruction    Patient understands how to develop strategies to promote health/change behavior.  Needs Instruction      Complications   Last HgB A1C per patient/outside source  11.4 %    How often do you check your blood sugar?  3-4 times/day    Fasting Blood glucose range (mg/dL)  597-471    Postprandial Blood glucose range (mg/dL)  855-015    Number of hypoglycemic episodes per month  0    Have you had a dilated eye exam in the past 12 months?  No    Have you had a dental exam in the past 12 months?  No    Are you checking your feet?  Yes    How many days per week are you checking your feet?  7      Dietary Intake   Breakfast  blue berry muffin OR fresh fruit    Snack (morning)  fresh fruit     Lunch  sandwich OR left over chicken, fruit cup, occasionally vegetables    Dinner  lean meat, no more rice now,  vegetables and fruit cup    Beverage(s)  water, occasionally a Gingerale      Exercise   Exercise Type  ADL's has defibrillator, would like to do water aerobics      Patient Education   Previous Diabetes Education  No    Nutrition management   Carbohydrate counting;Role of diet in the treatment of diabetes and the relationship between the three main macronutrients and blood glucose level;Food label reading, portion sizes and measuring food.;Reviewed blood glucose goals for pre and post meals and how to evaluate the patients' food intake on their blood glucose level.    Physical activity and exercise   Role of exercise on diabetes management, blood pressure control and cardiac health.    Medications  Reviewed patients medication for diabetes, action, purpose, timing of dose and side effects.     Monitoring  Identified appropriate SMBG and/or A1C goals.;Purpose and frequency of SMBG.    Psychosocial adjustment  Worked with patient to identify barriers to care and solutions      Individualized Goals (developed by patient)   Nutrition  Follow meal plan discussed    Physical Activity  Exercise 3-5 times per week    Medications  take my medication as prescribed    Monitoring   test blood glucose pre and post meals as discussed      Post-Education Assessment   Patient understands the diabetes disease and treatment process.  Demonstrates understanding / competency    Patient understands incorporating nutritional management into lifestyle.  Demonstrates understanding / competency    Patient undertands incorporating physical activity into lifestyle.  Demonstrates understanding / competency    Patient understands using medications safely.  Demonstrates understanding / competency    Patient understands monitoring blood glucose, interpreting and using results  Demonstrates understanding / competency    Patient understands prevention, detection, and treatment of acute complications.  Demonstrates understanding / competency    Patient understands prevention, detection, and treatment of chronic complications.  Demonstrates understanding / competency    Patient understands how to develop strategies to address psychosocial issues.  Demonstrates understanding / competency    Patient understands how to develop strategies to promote health/change behavior.  Demonstrates understanding / competency      Outcomes   Future DMSE  PRN    Program Status  Completed       Individualized Plan for Diabetes Self-Management Training:   Learning Objective:  Patient will have a greater understanding of diabetes self-management. Patient education plan is to attend individual and/or group sessions per assessed needs and concerns.   Plan:   Patient Instructions  Plan:  Aim for 2-3 Carb Choices per meal (30-45  grams) Aim for 0-1 Carbs per snack if hungry  Include protein in moderation with your meals and snacks Consider reading food labels for Total Carbohydrate of foods Consider  increasing your activity level by walking or water exercises for 10-15 minutes as tolerated Consider checking BG at alternate times per day  Continue taking medication as directed by MD  Expected Outcomes:     Education material provided: Food label handouts, A1C conversion sheet, Meal plan card and Carbohydrate counting sheet  If problems or questions, patient to contact team via:  Phone  Future DSME appointment: PRN

## 2018-02-01 NOTE — Patient Instructions (Signed)
Plan:  Aim for 2-3 Carb Choices per meal (30-45 grams) Aim for 0-1 Carbs per snack if hungry  Include protein in moderation with your meals and snacks Consider reading food labels for Total Carbohydrate of foods Consider  increasing your activity level by walking or water exercises for 10-15 minutes as tolerated Consider checking BG at alternate times per day  Continue taking medication as directed by MD

## 2018-02-03 ENCOUNTER — Telehealth: Payer: Self-pay | Admitting: Family Medicine

## 2018-02-03 DIAGNOSIS — E1121 Type 2 diabetes mellitus with diabetic nephropathy: Secondary | ICD-10-CM

## 2018-02-03 MED ORDER — INSULIN PEN NEEDLE 31G X 5 MM MISC: 2 refills | Status: AC

## 2018-02-03 NOTE — Telephone Encounter (Signed)
Please send in  Ultra fine pen needles to [facility].  She will pick up same time as the strips that have been sent.

## 2018-02-03 NOTE — Telephone Encounter (Signed)
done

## 2018-02-07 DIAGNOSIS — Z95810 Presence of automatic (implantable) cardiac defibrillator: Secondary | ICD-10-CM | POA: Diagnosis not present

## 2018-02-07 DIAGNOSIS — Z4502 Encounter for adjustment and management of automatic implantable cardiac defibrillator: Secondary | ICD-10-CM | POA: Diagnosis not present

## 2018-02-12 ENCOUNTER — Other Ambulatory Visit: Payer: Self-pay

## 2018-02-12 ENCOUNTER — Emergency Department (HOSPITAL_COMMUNITY): Payer: Medicare Other

## 2018-02-12 ENCOUNTER — Emergency Department (HOSPITAL_COMMUNITY)
Admission: EM | Admit: 2018-02-12 | Discharge: 2018-02-12 | Disposition: A | Discharge status: Home / Self Care | Payer: Medicare Other | Attending: Emergency Medicine | Admitting: Emergency Medicine

## 2018-02-12 DIAGNOSIS — Z794 Long term (current) use of insulin: Secondary | ICD-10-CM | POA: Diagnosis not present

## 2018-02-12 DIAGNOSIS — Z87891 Personal history of nicotine dependence: Secondary | ICD-10-CM | POA: Diagnosis not present

## 2018-02-12 DIAGNOSIS — Z951 Presence of aortocoronary bypass graft: Secondary | ICD-10-CM | POA: Insufficient documentation

## 2018-02-12 DIAGNOSIS — N183 Chronic kidney disease, stage 3 (moderate): Secondary | ICD-10-CM | POA: Insufficient documentation

## 2018-02-12 DIAGNOSIS — M542 Cervicalgia: Secondary | ICD-10-CM | POA: Diagnosis not present

## 2018-02-12 DIAGNOSIS — E1122 Type 2 diabetes mellitus with diabetic chronic kidney disease: Secondary | ICD-10-CM | POA: Insufficient documentation

## 2018-02-12 DIAGNOSIS — R531 Weakness: Secondary | ICD-10-CM | POA: Diagnosis not present

## 2018-02-12 DIAGNOSIS — Z79899 Other long term (current) drug therapy: Secondary | ICD-10-CM | POA: Insufficient documentation

## 2018-02-12 DIAGNOSIS — I2510 Atherosclerotic heart disease of native coronary artery without angina pectoris: Secondary | ICD-10-CM | POA: Insufficient documentation

## 2018-02-12 DIAGNOSIS — Z7982 Long term (current) use of aspirin: Secondary | ICD-10-CM | POA: Diagnosis not present

## 2018-02-12 DIAGNOSIS — I130 Hypertensive heart and chronic kidney disease with heart failure and stage 1 through stage 4 chronic kidney disease, or unspecified chronic kidney disease: Secondary | ICD-10-CM | POA: Diagnosis not present

## 2018-02-12 DIAGNOSIS — M25511 Pain in right shoulder: Secondary | ICD-10-CM

## 2018-02-12 DIAGNOSIS — I5022 Chronic systolic (congestive) heart failure: Secondary | ICD-10-CM | POA: Diagnosis not present

## 2018-02-12 MED ORDER — KETOROLAC TROMETHAMINE 30 MG/ML IJ SOLN
30.0000 mg | Freq: Once | INTRAMUSCULAR | Status: AC
  Administered 2018-02-12: 30 mg via INTRAMUSCULAR
  Filled 2018-02-12: qty 1

## 2018-02-12 NOTE — ED Triage Notes (Signed)
Pt reports she woke up on 02/10/2018 and her right neck and shoulder were in severe pain. States it may be the way she slept, but she would "like a pain pill or something"

## 2018-02-12 NOTE — Discharge Instructions (Addendum)
X-rays looked good! Nothing was broken or dislocated. There is some mild arthritis seen in the joint.  You may use Tylenol to help with pain. Heat compresses will also help with pain relief.  Follow-up with your PCP in 4-6 weeks if you continue to have this pain. She may make the decision if you need a referral to an orthopedic provider.  Take care of yourself! Thank you for allowing me to help take care of you today.

## 2018-02-12 NOTE — ED Provider Notes (Signed)
[facility]-EMERGENCY DEPT Provider Note  CSN: [id number] Arrival date & time: 02/12/18  1354  History   Chief Complaint Chief Complaint  Patient presents with  . Neck Pain  . Shoulder Pain    HPI Rachel Oneill is a 67 y.o. female with a medical history of CHF, HTN, Type 2 DM, CKD stage 3, AAA and CAD s/p bypass who presented to the ED for right shoulder pain x2 days. She describes constant throbbing pain that "feels like a strain" and worsens with movements. She states it began after sleeping on it. Pain occasionally radiates to her neck. Patient has tried Biofreeze on the shoulder with minimal relief. Denies chest pain, SOB, palpitations. She denies recent trauma, falls or injuries. Denies paresthesias, but endorses weakness.  Past Medical History:  Diagnosis Date  . Bronchitis   . CHF (congestive heart failure) (HCC)   . Hypertension   . Pneumonia    2008ish  . Uncontrolled diabetes mellitus with complications (HCC)    Type II    Patient Active Problem List   Diagnosis Date Noted  . Ischemic cardiomyopathy 12/29/2016  . Chronic kidney disease, stage 3, mod decreased GFR (HCC) 08/07/2016  . Vitamin D deficiency 08/07/2016  . AAA (abdominal aortic aneurysm) without rupture (HCC) 11/04/2015  . PAD (peripheral artery disease) (HCC) 11/04/2015  . Chronic systolic heart failure (HCC) 10/11/2013  . CAD (coronary artery disease) of artery bypass graft 07/12/2013  . Hyperlipidemia LDL goal <100 07/12/2013  . Essential hypertension 07/12/2013  . Type 2 diabetes with nephropathy (HCC) 07/12/2013  . Mitral regurgitation 07/12/2013    Past Surgical History:  Procedure Laterality Date  . CARDIAC SURGERY     double bypass 2007  . CORONARY ARTERY BYPASS GRAFT    . ICD IMPLANT N/A 12/29/2016   Procedure: ICD Implant;  Surgeon: Regan Lemming, MD;  Location: [facility] INVASIVE CV LAB;  Service: Cardiovascular;  Laterality: N/A;  . LIGATION OF ARTERIOVENOUS   FISTULA Left 12/08/2016   Procedure: LIGATION OF Left arm ARTERIOVENOUS  FISTULA;  Surgeon: Sherren Kerns, MD;  Location: [facility] OR;  Service: Vascular;  Laterality: Left;     OB History   None      Home Medications    Prior to Admission medications   Medication Sig Start Date End Date Taking? Authorizing Provider  albuterol (PROVENTIL HFA;VENTOLIN HFA) 108 (90 Base) MCG/ACT inhaler INHALE 1 PUFF BY MOUTH EVERY 6 HOURS AS NEEDED FOR WHEEZING OR SHORTNESS OR BREATH 10/06/17   Henson, Vickie L, NP-C  amiodarone (PACERONE) 200 MG tablet Take 200 mg by mouth daily.    [provider]  aspirin 81 MG chewable tablet Chew 1 tablet (81 mg total) by mouth daily. 07/05/13   Richarda Overlie, MD  atorvastatin (LIPITOR) 80 MG tablet Take 1 tablet (80 mg total) by mouth daily. 10/10/13   Richarda Overlie, MD  cephALEXin (KEFLEX) 500 MG capsule Take 1 capsule (500 mg total) by mouth 3 (three) times daily. 12/20/17   Loren Racer, MD  ENTRESTO 97-103 MG Take 1 tablet by mouth 2 (two) times daily.  01/24/16   [provider]  furosemide (LASIX) 20 MG tablet Take 20 mg by mouth 2 (two) times daily.     [provider]  Glucose Blood (BLOOD GLUCOSE TEST STRIPS) STRP Test twice a day. Pt uses one touch ultra mini meter 01/31/18   Henson, Vickie L, NP-C  Insulin Glargine (LANTUS SOLOSTAR) 100 UNIT/ML Solostar Pen Inject 10 Units into  the skin daily at 10 pm. Patient taking differently: Inject 5 Units into the skin daily at 10 pm.  12/17/17   Hetty Blend L, NP-C  Insulin Pen Needle 31G X 5 MM MISC As directed 02/03/18   Tysinger, Kermit Balo, PA-C  KLOR-CON M20 20 MEQ tablet TAKE ONE TABLET BY MOUTH WHEN YOU TAKE LASIX Patient taking differently: Take one-half a tablet with torsemide 03/12/17   Henson, Vickie L, NP-C  linagliptin (TRADJENTA) 5 MG TABS tablet Take 1 tablet (5 mg total) by mouth daily. Patient not taking: Reported on 12/29/2017 12/17/17   Hetty Blend L, NP-C  torsemide (DEMADEX) 20  MG tablet Take 20 mg by mouth daily. 12/04/17   [provider]  Vitamin D, Cholecalciferol, 1000 units TABS Take 2,000 Units by mouth every other day.    [provider]    Family History Family History  Problem Relation Age of Onset  . Diabetes Mother   . Brain cancer Mother   . Cancer Father        Neck  . Colon cancer Neg Hx   . Stomach cancer Neg Hx     Social History Social History   Tobacco Use  . Smoking status: Former Smoker    Years: 30.00    Types: Cigarettes    Last attempt to quit: 05/31/2006    Years since quitting: 11.7  . Smokeless tobacco: Never Used  Substance Use Topics  . Alcohol use: No  . Drug use: No     Allergies   Shellfish allergy   Review of Systems Review of Systems  Constitutional: Negative.   Musculoskeletal: Positive for arthralgias and neck pain. Negative for back pain, joint swelling and neck stiffness.  Skin: Negative for color change and wound.  Neurological: Positive for weakness. Negative for tremors and numbness.  Hematological: Negative.      Physical Exam Updated Vital Signs BP 130/73 (BP Location: Left Arm)   Pulse 69   Temp 97.6 F (36.4 C) (Oral)   Resp 18   Ht 5\' 4"  (1.626 m)   Wt 63 kg (139 lb)   SpO2 94%   BMI 23.86 kg/m   Physical Exam  Neck: Normal range of motion and full passive range of motion without pain. Neck supple. No spinous process tenderness and no muscular tenderness present. Normal range of motion present.  Cardiovascular: Intact distal pulses.  Musculoskeletal:       Right shoulder: She exhibits decreased range of motion, tenderness and decreased strength. She exhibits no bony tenderness, no swelling and no deformity.       Left shoulder: Normal.       Right elbow: Normal.      Left elbow: Normal.       Cervical back: Normal.  Significant tenderness with palpation of anterior deltoid and biceps insertion site on right arm. Limited active ROM in shoulder, especially with  flexion. Passive ROM intact. 5/5 strength in upper extremities bilaterally.  Neurological: She displays normal reflexes. No sensory deficit. She exhibits normal muscle tone. Coordination normal.  Skin: Skin is warm and intact. No bruising and no ecchymosis noted.  Nursing note and vitals reviewed.    ED Treatments / Results  Labs (all labs ordered are listed, but only abnormal results are displayed) Labs Reviewed - No data to display  EKG None  Radiology Dg Shoulder Right  Result Date: 02/12/2018 CLINICAL DATA:  Onset right shoulder pain when the patient woke up 02/10/2018. No known injury. EXAM: RIGHT  SHOULDER - 2+ VIEW COMPARISON:  None. FINDINGS: There is no acute bony or joint abnormality. Mild to moderate acromioclavicular osteoarthritis is noted. The glenohumeral joint is unremarkable. Imaged right lung and ribs appear normal. IMPRESSION: No acute finding. Mild to moderate acromioclavicular osteoarthritis. Electronically Signed   By: Drusilla Kanner M.D.   On: 02/12/2018 16:35    Procedures Procedures (including critical care time)  Medications Ordered in ED Medications  ketorolac (TORADOL) 30 MG/ML injection 30 mg (30 mg Intramuscular Given 02/12/18 1542)     Initial Impression / Assessment and Plan / ED Course  Triage vital signs and the nursing notes have been reviewed.  Pertinent labs & imaging results that were available during care of the patient were reviewed and considered in medical decision making (see chart for details).  Clinical Course as of Feb 13 1651  Sat Feb 12, 2018  1641 Shoulder x-ray reassuring. No fractures or dislocations seen. Some arthritis seen at [facility] joint. Patient received IM Toradol for the pain and reports significant improvement in pain.   [GM]    Clinical Course User Index [GM] Mortis, Sharyon Medicus, PA-C   Patient is in no distress and well appearing. Patient has full sensation in upper extremities bilaterally. She also has full passive  ROM, but active ROM limited due to pain. No deformities, decreased muscle tone or other abnormalities appreciated. Patient has no focal neuro deficits. X-rays are reassuring. There are no other physical exam findings or s/s that suggest an infectious or rheumatologic etiology to her pain. Likely MSK deltoid strain due to physical exam findings. Patient able to perform external/internal rotation and abduction. Most pain is with flexion and extension which would suggest deltoid origin.  Final Clinical Impressions(s) / ED Diagnoses  1. Shoulder Pain. Likely deltoid sprain. Education provided on OTC and supportive treatments for pain relief. Tylenol preferred given pt's cardiac history. Advised to follow-up with PCP if pain persists > 4 weeks. Will defer decision to refer to ortho to PCP.   Dispo: Home. After thorough clinical evaluation, this patient is determined to be medically stable and can be safely discharged with the previously mentioned treatment and/or outpatient follow-up/referral(s). At this time, there are no other apparent medical conditions that require further screening, evaluation or treatment.  Final diagnoses:  Acute pain of right shoulder    ED Discharge Orders    None        Reva Bores 02/12/18 1652    Maia Plan, MD 02/12/18 2024

## 2018-02-14 ENCOUNTER — Ambulatory Visit: Payer: Medicare Other | Admitting: Endocrinology

## 2018-02-21 DIAGNOSIS — I129 Hypertensive chronic kidney disease with stage 1 through stage 4 chronic kidney disease, or unspecified chronic kidney disease: Secondary | ICD-10-CM | POA: Diagnosis not present

## 2018-02-21 DIAGNOSIS — I2510 Atherosclerotic heart disease of native coronary artery without angina pectoris: Secondary | ICD-10-CM | POA: Diagnosis not present

## 2018-02-21 DIAGNOSIS — D631 Anemia in chronic kidney disease: Secondary | ICD-10-CM | POA: Diagnosis not present

## 2018-02-21 DIAGNOSIS — R809 Proteinuria, unspecified: Secondary | ICD-10-CM | POA: Diagnosis not present

## 2018-02-21 DIAGNOSIS — N184 Chronic kidney disease, stage 4 (severe): Secondary | ICD-10-CM | POA: Diagnosis not present

## 2018-02-21 DIAGNOSIS — N183 Chronic kidney disease, stage 3 (moderate): Secondary | ICD-10-CM | POA: Diagnosis not present

## 2018-02-21 DIAGNOSIS — I5043 Acute on chronic combined systolic (congestive) and diastolic (congestive) heart failure: Secondary | ICD-10-CM | POA: Diagnosis not present

## 2018-02-22 ENCOUNTER — Ambulatory Visit (INDEPENDENT_AMBULATORY_CARE_PROVIDER_SITE_OTHER): Payer: Medicare Other | Admitting: Family Medicine

## 2018-02-22 ENCOUNTER — Encounter: Payer: Self-pay | Admitting: Family Medicine

## 2018-02-22 ENCOUNTER — Other Ambulatory Visit: Payer: Self-pay | Admitting: Nephrology

## 2018-02-22 VITALS — BP 110/70 | HR 65 | Temp 98.4°F | Resp 20 | Ht 66.0 in | Wt 143.8 lb

## 2018-02-22 DIAGNOSIS — I10 Essential (primary) hypertension: Secondary | ICD-10-CM

## 2018-02-22 DIAGNOSIS — E1121 Type 2 diabetes mellitus with diabetic nephropathy: Secondary | ICD-10-CM | POA: Diagnosis not present

## 2018-02-22 DIAGNOSIS — Z0000 Encounter for general adult medical examination without abnormal findings: Secondary | ICD-10-CM | POA: Diagnosis not present

## 2018-02-22 DIAGNOSIS — I739 Peripheral vascular disease, unspecified: Secondary | ICD-10-CM

## 2018-02-22 DIAGNOSIS — R0902 Hypoxemia: Secondary | ICD-10-CM | POA: Diagnosis not present

## 2018-02-22 DIAGNOSIS — J449 Chronic obstructive pulmonary disease, unspecified: Secondary | ICD-10-CM

## 2018-02-22 DIAGNOSIS — N183 Chronic kidney disease, stage 3 unspecified: Secondary | ICD-10-CM

## 2018-02-22 DIAGNOSIS — R0609 Other forms of dyspnea: Secondary | ICD-10-CM | POA: Diagnosis not present

## 2018-02-22 DIAGNOSIS — I5022 Chronic systolic (congestive) heart failure: Secondary | ICD-10-CM

## 2018-02-22 NOTE — Patient Instructions (Signed)
Dr. Lucianne Muss-  Address: [address], [place], [place] [address] Phone: [phone]   [facility] will be calling you soon to come out to give you oxygen.   [address] [place], [place] [address]  Phone: [phone]

## 2018-02-22 NOTE — Progress Notes (Signed)
Rachel Oneill is a 67 y.o. female who presents for annual wellness visit and follow-up on chronic medical conditions.  She has the following concerns:  Increased shortness of breath on exertion. States she saw her cardiologist and nephrologist yesterday.  States torsemide was changed to 2 tablets in the morning. She is not on Lasix.  Entresto 1/2 tablet in the morning and 1/2 tablet at night.   She sees Dr. Jacinto Oneill again in 3 weeks.   States she is needing to use her albuterol inhaler due to the heat. States the heat has "always" affected her breathing. Using this 1-2 x per week.   FBS 142-181. She is seeing an endocrinologist next week.    Immunization History  Administered Date(s) Administered  . Influenza-Unspecified 05/11/2015, 05/10/2016  . Pneumococcal Conjugate-13 08/06/2016   Last Pap smear: 4 years ago per patient.  Last mammogram: 2018 Last colonoscopy: did cologuard 2018 Last DEXA: 2018 Dentist: dentures  Ophtho: 11/2016  Exercise: couple times a week  Other doctors caring for patient include: Dr. Christella Oneill- GI Dr. Jacinto Oneill- Heart Dr. Gaylyn Oneill- Nutrition Dr. Ronalee Oneill- Kidney Dr. Lucianne Oneill - endocrinologist   Depression screen:  See questionnaire below.  Depression screen [facility] 2/9 02/22/2018 02/01/2018 08/06/2016  Decreased Interest 0 0 0  Down, Depressed, Hopeless 0 0 0  PHQ - 2 Score 0 0 0    Fall Risk Screen: see questionnaire below. Fall Risk  02/22/2018 02/01/2018 08/06/2016  Falls in the past year? No No No    ADL screen:  See questionnaire below Functional Status Survey: Is the patient deaf or have difficulty hearing?: No Does the patient have difficulty seeing, even when wearing glasses/contacts?: No Does the patient have difficulty concentrating, remembering, or making decisions?: No Does the patient have difficulty walking or climbing stairs?: No Does the patient have difficulty dressing or bathing?: No Does the patient have difficulty doing errands alone such  as visiting a doctor's office or shopping?: No   End of Life Discussion:  Patient does not have a living will and medical power of attorney. MOST form filled out with patient.   Review of Systems Constitutional: -fever, -chills, -sweats, -unexpected weight change, -anorexia, +fatigue Allergy: -sneezing, -itching, -congestion Dermatology: denies changing moles, rash, lumps, new worrisome lesions ENT: -runny nose, -ear pain, -sore throat, -hoarseness, -sinus pain,  Cardiology:  -chest pain, -palpitations, +edema, -orthopnea, -paroxysmal nocturnal dyspnea Respiratory: -cough, -shortness of breath, +dyspnea on exertion, -wheezing, -hemoptysis Gastroenterology: -abdominal pain, -nausea, -vomiting, -diarrhea, -constipation, -blood in stool, -changes in bowel movement, -dysphagia Hematology: -bleeding or bruising problems Musculoskeletal: -arthralgias, -myalgias, -joint swelling, -back pain, -neck pain, -cramping, -gait changes Ophthalmology: -vision changes, -eye redness, -itching, -discharge Urology: -dysuria, -difficulty urinating, -hematuria, -urinary frequency, -urgency, incontinence Neurology: -headache, -weakness, -tingling, -numbness, -speech abnormality, -memory loss, -falls, -dizziness Psychology:  -depressed mood, -agitation, -sleep problems    PHYSICAL EXAM:  BP 110/70 Comment: our cuff  Pulse 65   Temp 98.4 F (36.9 C) (Oral)   Resp 20   Ht 5\' 6"  (1.676 m)   Wt 143 lb 12.8 oz (65.2 kg)   SpO2 (!) 88%   BMI 23.21 kg/m   General Appearance: Alert, cooperative, no distress, thin, frail appearing, appears older than stated age Head: Normocephalic, without obvious abnormality, atraumatic Eyes: PERRL, conjunctiva/corneas clear, EOM's intact, fundi benign Ears: Normal TM's and external ear canals Nose: Nares normal, mucosa normal, no drainage or sinus   tenderness Throat: Lips, mucosa, and tongue normal; poor dentition, partial dentures.  Neck: Supple, no lymphadenopathy;  thyroid: no enlargement/tenderness/nodules; no carotid bruit or JVD Back: Spine nontender, no curvature, ROM normal, no CVA tenderness Lungs: Clear to auscultation bilaterally without wheezes, rales or ronchi; respirations unlabored at rest Chest Wall: No tenderness or deformity Heart: Regular rate and rhythm Breast Exam: declines  Abdomen: declines  Genitalia: declines  Extremities: No clubbing, cyanosis or edema Pulses: 2+ and symmetric all extremities Skin: Skin color, texture, turgor normal, no rashes or lesions. Discoloration to bilateral LE ankles, right darker than left. Trace edema in LLE and 1+ edema in RLE edema. Her right knee is also swollen. Lymph nodes: Cervical, supraclavicular, and axillary nodes normal Neurologic: CNII-XII intact, normal strength, sensation and gait; reflexes 2+ and symmetric throughout Psych: Normal mood, affect, hygiene and grooming.  ASSESSMENT/PLAN: Medicare annual wellness visit, subsequent  Chronic systolic heart failure (HCC) - Plan: For home use only DME oxygen  PAD (peripheral artery disease) (HCC)  Type 2 diabetes with nephropathy (HCC)  Chronic kidney disease, stage 3, mod decreased GFR (HCC)  Essential hypertension  Hypoxia - Plan: For home use only DME oxygen  DOE (dyspnea on exertion)  COPD without exacerbation (HCC) - Plan: For home use only DME oxygen  She is not in any acute distress. Euvolemic.  History of COPD, no apparent acute exacerbation. Has only been using albuterol 1-2 times per week.  CHF treated with torsemide and continues to be hypoxic.  Her oxygen level is 88% at rest on room air.  HTN- BP in goal range.  PAD- stable per patient after seeing Dr. Jacinto Oneill.  Diabetes- no low blood sugars or greater than 200. She will follow up with Dr. Lucianne Oneill as scheduled on 07/26/2019f for uncontrolled diabetes.  CKD being managed by nephrology.  Handicap placard application filled out and given to patient.   Follow up next week  for hypoxia. [facility] is taking her oxygen to her house today. Overnight pulse oximetry ordered. Attempted to contact Dr. Jacinto Oneill office but their phones are not working. I do not have his notes from her visit with them yesterday.   Discussed monthly self breast exams and yearly mammograms; at least 30 minutes of aerobic activity at least 5 days/week and weight-bearing exercise 2x/week; proper sunscreen use reviewed; healthy diet, including goals of calcium and vitamin D intake and alcohol recommendations (less than or equal to 1 drink/day) reviewed; regular seatbelt use; changing batteries in smoke detectors.  Immunization recommendations discussed.  Colonoscopy recommendations reviewed   Medicare Attestation I have personally reviewed: The patient's medical and social history Their use of alcohol, tobacco or illicit drugs Their current medications and supplements The patient's functional ability including ADLs,fall risks, home safety risks, cognitive, and hearing and visual impairment Diet and physical activities Evidence for depression or mood disorders  The patient's weight, height, and BMI have been recorded in the chart.  I have made referrals, counseling, and provided education to the patient based on review of the above and I have provided the patient with a written personalized care plan for preventive services.     Hetty Blend, NP-C   02/22/2018

## 2018-03-01 ENCOUNTER — Ambulatory Visit: Payer: Medicare Other | Admitting: Family Medicine

## 2018-03-02 ENCOUNTER — Emergency Department (HOSPITAL_COMMUNITY): Payer: Medicare Other

## 2018-03-02 ENCOUNTER — Emergency Department (HOSPITAL_COMMUNITY)
Admission: EM | Admit: 2018-03-02 | Discharge: 2018-03-02 | Disposition: A | Discharge status: Home / Self Care | Payer: Medicare Other | Attending: Emergency Medicine | Admitting: Emergency Medicine

## 2018-03-02 DIAGNOSIS — E1122 Type 2 diabetes mellitus with diabetic chronic kidney disease: Secondary | ICD-10-CM | POA: Diagnosis not present

## 2018-03-02 DIAGNOSIS — N183 Chronic kidney disease, stage 3 (moderate): Secondary | ICD-10-CM | POA: Diagnosis not present

## 2018-03-02 DIAGNOSIS — I129 Hypertensive chronic kidney disease with stage 1 through stage 4 chronic kidney disease, or unspecified chronic kidney disease: Secondary | ICD-10-CM | POA: Insufficient documentation

## 2018-03-02 DIAGNOSIS — Z87891 Personal history of nicotine dependence: Secondary | ICD-10-CM | POA: Insufficient documentation

## 2018-03-02 DIAGNOSIS — Z79899 Other long term (current) drug therapy: Secondary | ICD-10-CM | POA: Insufficient documentation

## 2018-03-02 DIAGNOSIS — G8911 Acute pain due to trauma: Secondary | ICD-10-CM | POA: Insufficient documentation

## 2018-03-02 DIAGNOSIS — M25511 Pain in right shoulder: Secondary | ICD-10-CM | POA: Diagnosis not present

## 2018-03-02 DIAGNOSIS — Z951 Presence of aortocoronary bypass graft: Secondary | ICD-10-CM | POA: Diagnosis not present

## 2018-03-02 DIAGNOSIS — Z95810 Presence of automatic (implantable) cardiac defibrillator: Secondary | ICD-10-CM | POA: Diagnosis not present

## 2018-03-02 MED ORDER — TRAMADOL HCL 50 MG PO TABS: 50.0000 mg | ORAL_TABLET | Freq: Four times a day (QID) | ORAL | 0 refills | Status: DC | PRN
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
## 2018-03-02 NOTE — ED Notes (Signed)
Bed: WTR7 Expected date:  Expected time:  Means of arrival:  Comments: 
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
## 2018-03-02 NOTE — Discharge Instructions (Addendum)
Follow-up with your family doctor next week if any problems.  Try extra strength Tylenol for pain and if that does not help you can use the tramadol

## 2018-03-03 DIAGNOSIS — I5043 Acute on chronic combined systolic (congestive) and diastolic (congestive) heart failure: Secondary | ICD-10-CM | POA: Diagnosis not present

## 2018-03-03 DIAGNOSIS — I129 Hypertensive chronic kidney disease with stage 1 through stage 4 chronic kidney disease, or unspecified chronic kidney disease: Secondary | ICD-10-CM | POA: Diagnosis not present

## 2018-03-03 DIAGNOSIS — I2510 Atherosclerotic heart disease of native coronary artery without angina pectoris: Secondary | ICD-10-CM | POA: Diagnosis not present

## 2018-03-03 DIAGNOSIS — N184 Chronic kidney disease, stage 4 (severe): Secondary | ICD-10-CM | POA: Diagnosis not present

## 2018-03-04 ENCOUNTER — Encounter

## 2018-03-04 ENCOUNTER — Telehealth: Payer: Self-pay | Admitting: Emergency Medicine

## 2018-03-04 ENCOUNTER — Ambulatory Visit (INDEPENDENT_AMBULATORY_CARE_PROVIDER_SITE_OTHER): Payer: Medicare Other | Admitting: Endocrinology

## 2018-03-04 ENCOUNTER — Encounter: Payer: Self-pay | Admitting: Endocrinology

## 2018-03-04 VITALS — BP 102/60 | HR 68 | Ht 66.0 in | Wt 142.0 lb

## 2018-03-04 DIAGNOSIS — N183 Chronic kidney disease, stage 3 unspecified: Secondary | ICD-10-CM

## 2018-03-04 DIAGNOSIS — E1165 Type 2 diabetes mellitus with hyperglycemia: Secondary | ICD-10-CM | POA: Diagnosis not present

## 2018-03-04 DIAGNOSIS — E049 Nontoxic goiter, unspecified: Secondary | ICD-10-CM

## 2018-03-04 DIAGNOSIS — R601 Generalized edema: Secondary | ICD-10-CM | POA: Diagnosis not present

## 2018-03-04 LAB — GLUCOSE, POCT (MANUAL RESULT ENTRY): POC Glucose: 98 mg/dl (ref 70–99)

## 2018-03-04 LAB — TSH: TSH: 5.18 u[IU]/mL — ABNORMAL HIGH (ref 0.35–4.50)

## 2018-03-04 LAB — T4, FREE: Free T4: 0.97 ng/dL (ref 0.60–1.60)

## 2018-03-04 MED ORDER — LINAGLIPTIN 5 MG PO TABS: 5.0000 mg | ORAL_TABLET | Freq: Every day | ORAL | 1 refills | Status: DC

## 2018-03-04 NOTE — Patient Instructions (Addendum)
Check blood sugars on waking up   3/7 days  Also check blood sugars about 2 hours after a meal and do this after different meals by rotation  Recommended blood sugar levels on waking up is 90-130 and about 2 hours after meal is 130-160  Please bring your blood sugar monitor to each visit, thank you  Stop sodas

## 2018-03-04 NOTE — Progress Notes (Signed)
Patient ID: Rachel Oneill, female   DOB: 25-Sep-1950, 67 y.o.   MRN: [id number]          Reason for Appointment: Consultation for Type 2 Diabetes  Referring physician: Hetty Blend   History of Present Illness:          Date of diagnosis of type 2 diabetes mellitus:  2014      Background history:   She had been initially treated with metformin and this was continued until 2018 and stopped because of renal dysfunction She also has been treated at various times with Amaryl, glipizide Her follow-up with her PC has been erratic over the last few years A1c has been mostly 7 or above in 2017 and 2018  Recent history:   INSULIN regimen is:  Lantus insulin 5 units at bedtime   Non-insulin hypoglycemic drugs the patient is taking are: none   Current management, blood sugar patterns and problems identified:  she had significantly high readings in the first part of this year with the highest reading of 328 in 09/2017  She was started on Lantus insulin by her PCP in 12/2017   Because of hypoglycemia with 10 units of Lantus she was told to take 5 to 7 units subsequently and now taking 5 units  She is not taking any other medications for diabetes  Her blood sugars at home have been mildly increased fasting but reportedly higher before dinner  She does not check her readings after meals although today before lunch her sugar is only 98  She has not had any diabetes education or consultation with dietitian until June 2019  However she still has a can of regular soft drink daily  Her weight has been relatively stable with this year        Side effects from medications have been: None  Compliance with the medical regimen: Improving Hypoglycemia:   None recently  Glucose monitoring:  done about 2 times a day         Glucometer: One Touch.       Blood Glucose readings by recall:   PREMEAL Breakfast Lunch Dinner Bedtime  Overall   Glucose range: 102-145  180    Median:         POST-MEAL PC Breakfast PC Lunch PC Dinner  Glucose range:  ? ?  Median:      Self-care: The diet that the patient has been following is: tries to limit fried food       Typical meal intake: Breakfast is cereal/yogurt.               Dietician visit, most recent: 6/19               Exercise: [facility], about [facility] a [facility]    Weight history:  Wt Readings from Last 3 Encounters:  03/04/18 142 lb (64.4 kg)  02/22/18 143 lb 12.8 oz (65.2 kg)  02/12/18 139 lb (63 kg)    Glycemic control:   Lab Results  Component Value Date   HGBA1C 11.4% 12/17/2017   HGBA1C >14 12/03/2017   HGBA1C 7.4% 03/18/2017   Lab Results  Component Value Date   MICROALBUR 27.5 08/06/2016   LDLCALC 75 03/23/2017   CREATININE 1.65 (H) 12/20/2017   Lab Results  Component Value Date   MICRALBCREAT 141 (H) 08/06/2016    Lab Results  Component Value Date   FRUCTOSAMINE 383 (H) 03/04/2018      Allergies as  of 03/04/2018      Reactions   Shellfish Allergy Itching      Medication List        Accurate as of 03/04/18 11:59 PM. Always use your most recent med list.          albuterol 108 (90 Base) MCG/ACT inhaler Commonly known as:  PROVENTIL HFA;VENTOLIN HFA INHALE 1 PUFF BY MOUTH EVERY 6 HOURS AS NEEDED FOR WHEEZING OR SHORTNESS OR BREATH   amiodarone 200 MG tablet Commonly known as:  PACERONE Take 200 mg by mouth daily.   aspirin 81 MG chewable tablet Chew 1 tablet (81 mg total) by mouth daily.   atorvastatin 80 MG tablet Commonly known as:  LIPITOR Take 1 tablet (80 mg total) by mouth daily.   BLOOD GLUCOSE TEST STRIPS Strp Test twice a day. Pt uses one touch ultra mini meter   ENTRESTO 97-103 MG Generic drug:  sacubitril-valsartan Take 1 tablet by mouth 2 (two) times daily.   Insulin Glargine 100 UNIT/ML Solostar Pen Commonly known as:  LANTUS SOLOSTAR Inject 10 Units into the skin daily at 10 pm.   Insulin Pen Needle 31G X 5 MM  Misc As directed   KLOR-CON M20 20 MEQ tablet Generic drug:  potassium chloride SA TAKE ONE TABLET BY MOUTH WHEN YOU TAKE LASIX   linagliptin 5 MG Tabs tablet Commonly known as:  TRADJENTA Take 1 tablet (5 mg total) by mouth daily.   torsemide 20 MG tablet Commonly known as:  DEMADEX Take 20 mg by mouth daily.   traMADol 50 MG tablet Commonly known as:  ULTRAM Take 1 tablet (50 mg total) by mouth every 6 (six) hours as needed.   Vitamin D (Cholecalciferol) 1000 units Tabs Take 2,000 Units by mouth every other day.       Allergies:  Allergies  Allergen Reactions  . Shellfish Allergy Itching    Past Medical History:  Diagnosis Date  . Bronchitis   . CHF (congestive heart failure) (HCC)   . Hypertension   . Pneumonia    2008ish  . Uncontrolled diabetes mellitus with complications (HCC)    Type II    Past Surgical History:  Procedure Laterality Date  . CARDIAC SURGERY     double bypass 2007  . CORONARY ARTERY BYPASS GRAFT    . ICD IMPLANT N/A 12/29/2016   Procedure: ICD Implant;  Surgeon: Regan Lemming, MD;  Location: [facility] INVASIVE CV LAB;  Service: Cardiovascular;  Laterality: N/A;  . LIGATION OF ARTERIOVENOUS  FISTULA Left 12/08/2016   Procedure: LIGATION OF Left arm ARTERIOVENOUS  FISTULA;  Surgeon: Sherren Kerns, MD;  Location: [facility] OR;  Service: Vascular;  Laterality: Left;    Family History  Problem Relation Age of Onset  . Diabetes Mother   . Brain cancer Mother   . Cancer Father        Neck  . Colon cancer Neg Hx   . Stomach cancer Neg Hx     Social History:  reports that she quit smoking about 11 years ago. Her smoking use included cigarettes. She quit after 30.00 years of use. She has never used smokeless tobacco. She reports that she does not drink alcohol or use drugs.   Review of Systems  Constitutional: Negative for weight loss.  HENT: Negative for trouble swallowing.   Eyes: Negative for blurred vision.  Respiratory: Positive for  shortness of breath.   Cardiovascular: Positive for leg swelling.       Followed by cardiologist  for history of chronic systolic heart failure  Gastrointestinal: Negative for abdominal pain.  Endocrine: Positive for fatigue.       No history of known thyroid disease  Musculoskeletal: Positive for joint pain.       Occasionally does hurt  Skin: Negative for rash.  Neurological: Negative for numbness and tingling.  Psychiatric/Behavioral: Negative for insomnia.     Lipid history: Has been maintained on 80 mg Lipitor    Lab Results  Component Value Date   CHOL 127 03/23/2017   HDL 37 (L) 03/23/2017   LDLCALC 75 03/23/2017   TRIG 74 03/23/2017   CHOLHDL 3.4 03/23/2017           Hypertension: Not on any antihypertensive drugs  BP Readings from Last 3 Encounters:  03/04/18 102/60  03/02/18 132/87  02/22/18 110/70    Most recent eye exam was in 2017  Most recent foot exam:7/19  She does not think she has history of liver disease  Lab Results  Component Value Date   ALT 22 12/20/2017     LABS:  Office Visit on 03/04/2018  Component Date Value Ref Range Status  . POC Glucose 03/04/2018 98  70 - 99 mg/dl Final  . Fructosamine 16/05/9603 383* 0 - 285 umol/L Final   Comment: Published reference interval for apparently healthy subjects between age 8 and 6 is 39 - 285 umol/L and in a poorly controlled diabetic population is 228 - 563 umol/L with a mean of 396 umol/L.   Marland Kitchen Free T4 03/04/2018 0.97  0.60 - 1.60 ng/dL Final   Comment: Specimens from patients who are undergoing biotin therapy and /or ingesting biotin supplements may contain high levels of biotin.  The higher biotin concentration in these specimens interferes with this Free T4 assay.  Specimens that contain high levels  of biotin may cause false high results for this Free T4 assay.  Please interpret results in light of the total clinical presentation of the patient.    Marland Kitchen TSH 03/04/2018 5.18* 0.35 - 4.50  uIU/mL Final    Physical Examination:  BP 102/60 (BP Location: Left Arm, Patient Position: Sitting, Cuff Size: Normal)   Pulse 68   Ht 5\' 6"  (1.676 m)   Wt 142 lb (64.4 kg)   SpO2 97%   BMI 22.92 kg/m   GENERAL:  She is averagely built and nourished  HEENT:         Eye exam shows normal external appearance.  Fundus exam shows no retinopathy.  Oral exam shows normal mucosa .  NECK:   There is no lymphadenopathy  Thyroid is enlarged, firm and slightly nodular, about twice normal  Carotids are normal to palpation and no bruit heard  LUNGS:         Chest is symmetrical. Lungs are clear to auscultation.Marland Kitchen   HEART:         Heart sounds:  S1 and S2 are normal. No murmur or click heard., no S3 or S4.   ABDOMEN:   There is distention present.  Has significant dullness to percussion in the lateral areas of the abdomen Liver and spleen are not palpable. No other mass or tenderness present.    NEUROLOGICAL:   Ankle jerks are absent bilaterally.    Diabetic Foot Exam - Simple   Simple Foot Form Diabetic Foot exam was performed with the following findings:  Yes   Visual Inspection No deformities, no ulcerations, no other skin breakdown bilaterally:  Yes Sensation Testing Intact to touch  and monofilament testing bilaterally:  Yes Pulse Check See comments:  Yes Comments Pulses not palpable, also has pedal edema            Vibration sense is mildly reduced in distal first toes. MUSCULOSKELETAL:  There is no swelling or deformity of the peripheral joints.     EXTREMITIES:     There is 1+ pedal edema.  SKIN:       No rash or lesions of concern.        ASSESSMENT:  Diabetes type 2, nonobese  See history of present illness for detailed discussion of current diabetes management, blood sugar patterns and problems identified  He has had relatively mild hyperglycemia in the last few years with worsening earlier this year for no apparent reason  Complications of diabetes: Possibly  nephropathy with history of increased urine microalbumin, no history of retinopathy on record No evidence of neuropathy on exam  HYPERLIPIDEMIA: Well controlled on Lipitor, no recent labs available  Likely has MULTINODULAR goiter on exam Concerned that since she is on AMIODARONE which may alter thyroid function She needs to be evaluated regularly for thyroid dysfunction and no recent TSH available on record Also may consider thyroid ultrasound depending on results of thyroid function first  ANASARCA including ascites on exam: Etiology unclear, does not have marked proteinuria and suspect she may have underlying chronic liver disease  CKD: No records of evaluation from nephrologist available on file  PLAN:    Start linagliptin 5 mg daily as she may benefit from a DPP 4 drug with her currently mild hyperglycemia  Her fasting readings are relatively well controlled with only 5 units of LANTUS insulin and can continue  However if her fasting readings continue to improve may consider stopping insulin  She needs to be watching her diet as discussed with dietitian in June  She will need to eliminate regular soft drinks  Encouraged her to continue increasing frequency of her exercise  More consistent monitoring of blood sugars after meals and needs to bring her monitor for review  Evaluate level of control with fructosamine today  Follow-up in 1 month with repeat A1c  Patient Instructions  Check blood sugars on waking up   3/7 days  Also check blood sugars about 2 hours after a meal and do this after different meals by rotation  Recommended blood sugar levels on waking up is 90-130 and about 2 hours after meal is 130-160  Please bring your blood sugar monitor to each visit, thank you  Stop sodas      Consultation note has been sent to the referring physician  TOTAL visit time for evaluation and management of multiple problems and counseling = 60 minutes   Toshio Slusher 03/06/2018, 8:54 AM   Note: This office note was prepared with Dragon voice recognition system technology. Any transcriptional errors that result from this process are unintentional.   ADDENDUM: Fructosamine is high and her TSH is just slightly above normal, will continue to follow Will review thyroid further on next visit and consider ultrasound   Reather Littler

## 2018-03-04 NOTE — Telephone Encounter (Signed)
Pt called and stated she was seen this morning. She went to the pharmacy to pick up her linagliptin (TRADJENTA) 5 MG TABS tablet. Pt stated it cost too much and would like to know if we have any samples or if he can call in a different prescription. Please advise thanks.

## 2018-03-05 LAB — FRUCTOSAMINE: Fructosamine: 383 umol/L — ABNORMAL HIGH (ref 0–285)

## 2018-03-06 ENCOUNTER — Encounter (HOSPITAL_COMMUNITY): Payer: Self-pay | Admitting: Nurse Practitioner

## 2018-03-06 ENCOUNTER — Emergency Department (HOSPITAL_COMMUNITY)
Admission: EM | Admit: 2018-03-06 | Discharge: 2018-03-06 | Disposition: A | Discharge status: Home / Self Care | Payer: Medicare Other | Attending: Emergency Medicine | Admitting: Emergency Medicine

## 2018-03-06 ENCOUNTER — Emergency Department (HOSPITAL_COMMUNITY): Payer: Medicare Other

## 2018-03-06 DIAGNOSIS — I2510 Atherosclerotic heart disease of native coronary artery without angina pectoris: Secondary | ICD-10-CM | POA: Diagnosis not present

## 2018-03-06 DIAGNOSIS — M25551 Pain in right hip: Secondary | ICD-10-CM | POA: Diagnosis not present

## 2018-03-06 DIAGNOSIS — N183 Chronic kidney disease, stage 3 (moderate): Secondary | ICD-10-CM | POA: Diagnosis not present

## 2018-03-06 DIAGNOSIS — Z87891 Personal history of nicotine dependence: Secondary | ICD-10-CM | POA: Diagnosis not present

## 2018-03-06 DIAGNOSIS — Z794 Long term (current) use of insulin: Secondary | ICD-10-CM | POA: Diagnosis not present

## 2018-03-06 DIAGNOSIS — S79911A Unspecified injury of right hip, initial encounter: Secondary | ICD-10-CM | POA: Diagnosis not present

## 2018-03-06 DIAGNOSIS — Z951 Presence of aortocoronary bypass graft: Secondary | ICD-10-CM | POA: Diagnosis not present

## 2018-03-06 DIAGNOSIS — I130 Hypertensive heart and chronic kidney disease with heart failure and stage 1 through stage 4 chronic kidney disease, or unspecified chronic kidney disease: Secondary | ICD-10-CM | POA: Diagnosis not present

## 2018-03-06 DIAGNOSIS — E1122 Type 2 diabetes mellitus with diabetic chronic kidney disease: Secondary | ICD-10-CM | POA: Diagnosis not present

## 2018-03-06 DIAGNOSIS — Z7982 Long term (current) use of aspirin: Secondary | ICD-10-CM | POA: Insufficient documentation

## 2018-03-06 DIAGNOSIS — Z79899 Other long term (current) drug therapy: Secondary | ICD-10-CM | POA: Diagnosis not present

## 2018-03-06 DIAGNOSIS — I5022 Chronic systolic (congestive) heart failure: Secondary | ICD-10-CM | POA: Diagnosis not present

## 2018-03-06 MED ORDER — ACETAMINOPHEN 325 MG PO TABS
650.0000 mg | ORAL_TABLET | Freq: Once | ORAL | Status: AC
  Administered 2018-03-06: 650 mg via ORAL
  Filled 2018-03-06: qty 2

## 2018-03-06 MED ORDER — TRAMADOL HCL 50 MG PO TABS
50.0000 mg | ORAL_TABLET | Freq: Once | ORAL | Status: AC
  Administered 2018-03-06: 50 mg via ORAL
  Filled 2018-03-06: qty 1

## 2018-03-06 MED ORDER — TRAMADOL HCL 50 MG PO TABS: 50.0000 mg | ORAL_TABLET | Freq: Four times a day (QID) | ORAL | 0 refills | Status: DC | PRN
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
## 2018-03-06 NOTE — Discharge Instructions (Signed)
Take the prescribed medication as directed.  Can take tylenol or motrin with this. Follow-up with your primary care doctor. Return to the ED for new or worsening symptoms.

## 2018-03-07 ENCOUNTER — Ambulatory Visit: Payer: Medicare Other | Admitting: Family Medicine

## 2018-03-07 ENCOUNTER — Other Ambulatory Visit: Payer: Self-pay

## 2018-03-07 MED ORDER — SITAGLIPTIN PHOSPHATE 50 MG PO TABS: 50.0000 mg | ORAL_TABLET | Freq: Every day | ORAL | 3 refills | Status: DC

## 2018-03-07 NOTE — Telephone Encounter (Signed)
Called Rachel Oneill and left detailed voicemail letting Rachel Oneill know that Dr. Lucianne Muss switched the medication from Tradjenta to [facility]

## 2018-03-07 NOTE — Telephone Encounter (Signed)
Need to try sending Januvia 50 mg daily

## 2018-03-07 NOTE — Telephone Encounter (Signed)
Is there an alternative medication?

## 2018-03-08 ENCOUNTER — Other Ambulatory Visit: Payer: Medicare Other

## 2018-03-08 ENCOUNTER — Telehealth: Payer: Self-pay | Admitting: Internal Medicine

## 2018-03-08 NOTE — Telephone Encounter (Signed)
Pt called and canceled her appt yesterday as she states she couldn't walk and she was in so much pain. Pt was advised to get daughter to go drop off tramadol rx and pick it up so pt can take it. She will see if the tramadol will help and then she will follow-up with [place]

## 2018-03-10 ENCOUNTER — Telehealth: Payer: Self-pay | Admitting: Internal Medicine

## 2018-03-10 DIAGNOSIS — R0902 Hypoxemia: Secondary | ICD-10-CM

## 2018-03-10 DIAGNOSIS — R0609 Other forms of dyspnea: Secondary | ICD-10-CM

## 2018-03-10 NOTE — Addendum Note (Signed)
Addended by: Herminio Commons A on: 03/10/2018 10:53 AM   Modules accepted: Orders

## 2018-03-10 NOTE — Telephone Encounter (Signed)
Pt was notified and I have put in referral to [facility]
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
## 2018-03-10 NOTE — Telephone Encounter (Signed)
Please let her know that I would like for her to see a pulmonologist in the next few weeks since she is needing the oxygen. She really needs a full evaluation by them. Please refer her and let her know why I think this is important.

## 2018-03-11 ENCOUNTER — Other Ambulatory Visit: Payer: Self-pay

## 2018-03-17 ENCOUNTER — Encounter: Payer: Self-pay | Admitting: Family Medicine

## 2018-03-17 ENCOUNTER — Ambulatory Visit (INDEPENDENT_AMBULATORY_CARE_PROVIDER_SITE_OTHER): Payer: Medicare Other | Admitting: Family Medicine

## 2018-03-17 VITALS — BP 110/62 | HR 75 | Resp 18 | Ht 64.0 in | Wt 129.8 lb

## 2018-03-17 DIAGNOSIS — J9611 Chronic respiratory failure with hypoxia: Secondary | ICD-10-CM | POA: Insufficient documentation

## 2018-03-17 DIAGNOSIS — I10 Essential (primary) hypertension: Secondary | ICD-10-CM | POA: Diagnosis not present

## 2018-03-17 DIAGNOSIS — R0902 Hypoxemia: Secondary | ICD-10-CM

## 2018-03-17 DIAGNOSIS — R188 Other ascites: Secondary | ICD-10-CM | POA: Diagnosis not present

## 2018-03-17 DIAGNOSIS — R748 Abnormal levels of other serum enzymes: Secondary | ICD-10-CM | POA: Diagnosis not present

## 2018-03-17 DIAGNOSIS — I5022 Chronic systolic (congestive) heart failure: Secondary | ICD-10-CM | POA: Diagnosis not present

## 2018-03-17 NOTE — Progress Notes (Signed)
   Subjective:    Patient ID: Rachel Oneill, female    DOB: 05/03/1951, 67 y.o.   MRN: [id number]  HPI Chief Complaint  Patient presents with  . Follow-up   She is here for a follow up on hypoxia. Using nasal cannula 2 L at night and reports feeling much better in the mornings. States she uses the oxygen most of the time during the day.   Reports decreased LE edema. Reports decreased edema to her abdomen as well. Her nephrologist increased her torsemide to 40 mg daily. Overall she reports feeling much improved.   Her SpO2 was 87% upon arrival. She had been without O2 for approximately 20 minutes prior to arrival. After being on O2 her SpO2 increased to 95%.    She is not having to use albuterol. Unclear COPD history. She has never seen a pulmonologist. Has an appointment next week.   Denies fever, chills, dizziness, chest pain, palpitations, shortness of breath, orthopnea, abdominal pain, N/V/D, urinary symptoms, LE edema.    Review of Systems Pertinent positives and negatives in the history of present illness.     Objective:   Physical Exam  Constitutional: She is oriented to person, place, and time. She appears well-developed. No distress.  HENT:  Mouth/Throat: Oropharynx is clear and moist.  Eyes: Pupils are equal, round, and reactive to light.  Cardiovascular: Normal rate, regular rhythm and intact distal pulses.  Murmur heard. No LE edema  Pulmonary/Chest: Effort normal and breath sounds normal.  Abdominal: Soft. She exhibits ascites. There is no tenderness. There is no rigidity, no rebound and no guarding.  Bilateral abdomen sides with dullness to percussion. No obviously enlarged liver or spleen. No obvious fluid wave.   Musculoskeletal: Normal range of motion.  Neurological: She is alert and oriented to person, place, and time.  Skin: Skin is warm and dry. Capillary refill takes less than 2 seconds. No pallor.   BP 110/62   Pulse 75   Resp 18   Ht 5' 4" (1.626 m)    Wt 129 lb 12.8 oz (58.9 kg)   SpO2 (!) 89%   BMI 22.28 kg/m         Assessment & Plan:  Hypoxia  Chronic systolic heart failure (HCC)  Essential hypertension - Plan: CBC with Differential/Platelet, Comprehensive metabolic panel  Elevated alkaline phosphatase level - Plan: US Abdomen Limited RUQ  Other ascites - Plan: US Abdomen Limited RUQ, Comprehensive metabolic panel  Without oxygen her sats drop to mid to upper 80%. With 2L [place] her sats are in the mid 90s. She will continue using oxygen at home until she can be evaluated by pulmonologist.  Her cardiologist and nephrologist are closely following her.  Edema has improved and she feels much better.  History of elevated alk phos with ascites. Labs and abdominal US ordered to rule out underlying liver disease.  Follow up pending [place] report.

## 2018-03-18 ENCOUNTER — Other Ambulatory Visit: Payer: Self-pay | Admitting: Family Medicine

## 2018-03-18 DIAGNOSIS — E876 Hypokalemia: Secondary | ICD-10-CM

## 2018-03-18 LAB — COMPREHENSIVE METABOLIC PANEL
ALT: 23 IU/L (ref 0–32)
AST: 24 IU/L (ref 0–40)
Albumin/Globulin Ratio: 1.4 (ref 1.2–2.2)
Albumin: 4.1 g/dL (ref 3.6–4.8)
Alkaline Phosphatase: 254 IU/L — ABNORMAL HIGH (ref 39–117)
BUN/Creatinine Ratio: 24 (ref 12–28)
BUN: 32 mg/dL — AB (ref 8–27)
Bilirubin Total: 1.3 mg/dL — ABNORMAL HIGH (ref 0.0–1.2)
CALCIUM: 9.4 mg/dL (ref 8.7–10.3)
CO2: 29 mmol/L (ref 20–29)
CREATININE: 1.35 mg/dL — AB (ref 0.57–1.00)
Chloride: 94 mmol/L — ABNORMAL LOW (ref 96–106)
GFR calc Af Amer: 46 mL/min/{1.73_m2} — ABNORMAL LOW (ref >59)
GFR, EST NON AFRICAN AMERICAN: 40 mL/min/{1.73_m2} — AB (ref >59)
GLUCOSE: 162 mg/dL — AB (ref 65–99)
Globulin, Total: 3 g/dL (ref 1.5–4.5)
POTASSIUM: 3 mmol/L — AB (ref 3.5–5.2)
Sodium: 144 mmol/L (ref 134–144)
TOTAL PROTEIN: 7.1 g/dL (ref 6.0–8.5)

## 2018-03-18 LAB — CBC WITH DIFFERENTIAL/PLATELET
Basophils Absolute: 0 10*3/uL (ref 0.0–0.2)
Basos: 0 %
EOS (ABSOLUTE): 0.1 10*3/uL (ref 0.0–0.4)
Eos: 1 %
Hematocrit: 34.2 % (ref 34.0–46.6)
Hemoglobin: 11 g/dL — ABNORMAL LOW (ref 11.1–15.9)
IMMATURE GRANS (ABS): 0 10*3/uL (ref 0.0–0.1)
IMMATURE GRANULOCYTES: 0 %
LYMPHS: 9 %
Lymphocytes Absolute: 0.9 10*3/uL (ref 0.7–3.1)
MCH: 33.7 pg — ABNORMAL HIGH (ref 26.6–33.0)
MCHC: 32.2 g/dL (ref 31.5–35.7)
MCV: 105 fL — ABNORMAL HIGH (ref 79–97)
MONOS ABS: 1.1 10*3/uL — AB (ref 0.1–0.9)
Monocytes: 12 %
NEUTROS PCT: 78 %
Neutrophils Absolute: 7.4 10*3/uL — ABNORMAL HIGH (ref 1.4–7.0)
PLATELETS: 207 10*3/uL (ref 150–450)
RBC: 3.26 x10E6/uL — AB (ref 3.77–5.28)
RDW: 14.8 % (ref 12.3–15.4)
WBC: 9.5 10*3/uL (ref 3.4–10.8)

## 2018-03-22 ENCOUNTER — Encounter: Payer: Self-pay | Admitting: Pulmonary Disease

## 2018-03-22 ENCOUNTER — Other Ambulatory Visit: Payer: Medicare Other

## 2018-03-22 ENCOUNTER — Ambulatory Visit (INDEPENDENT_AMBULATORY_CARE_PROVIDER_SITE_OTHER): Payer: Medicare Other | Admitting: Pulmonary Disease

## 2018-03-22 DIAGNOSIS — J9611 Chronic respiratory failure with hypoxia: Secondary | ICD-10-CM

## 2018-03-22 DIAGNOSIS — G4733 Obstructive sleep apnea (adult) (pediatric): Secondary | ICD-10-CM | POA: Insufficient documentation

## 2018-03-22 DIAGNOSIS — G4730 Sleep apnea, unspecified: Secondary | ICD-10-CM

## 2018-03-22 MED ORDER — ALBUTEROL SULFATE HFA 108 (90 BASE) MCG/ACT IN AERS: 2.0000 | INHALATION_SPRAY | Freq: Four times a day (QID) | RESPIRATORY_TRACT | 6 refills | Status: DC | PRN

## 2018-03-22 NOTE — Patient Instructions (Signed)
Schedule sleep study in lab. Schedule PFTs  Ambulatory saturation -based on this we will decide if you qualify for portable concentrator Prescription for albuterol MDI 2 puffs every 6 hours as needed

## 2018-03-22 NOTE — Progress Notes (Signed)
Subjective:    Patient ID: Rachel Oneill, female    DOB: [birth year], 67 y.o.   MRN: [id number]  HPI Chief Complaint  Patient presents with  . Pulm Consult    Referred by Hetty Blend NP for hypoxia. She normally uses 2L of O2. Patient would like to have a smaller oxygen concentrator. Uses [facility] as her DME.    67 year old ex-smoker with chronic systolic heart failure presents for evaluation of hypoxia. She was noted to have saturation in the low 80s in her PCP office about a month ago and was started on oxygen.  She is good about using nocturnal 2 L during sleep while on oxygen concentrator.  However her portable oxygen tanks that were provided were quite heavy for her to carry and she has not been using this much at all.  On  an office visit on 8/8 she was again noted to have low saturations with improvement to the 90s on 2 L of oxygen.  She denies frequent chest colds or wheezing.  She has been prescribed albuterol in the past which provided some relief. She reports dyspnea on exertion especially in the hot weather when she is able to walk in the store. She feels that the main reason she is here is so that she can get a portable concentrator.  She feels the need for having oxygen in her car sometimes especially in the hot weather  Epworth sleepiness score is 10 and she denies excessive daytime somnolence and fatigue. Bedtime is around 11 PM, sleep latency is minimal, she sleeps on her right side with 2 pillows, reports frequent nocturnal awakenings which are spontaneous, denies nocturia and is out of bed by 6 AM rested with dryness of mouth but denies headaches.  She reports episodes where she has woken herself up with gasping episode in her sleep.  She is not willing to use a CPAP machine if required.  She feels that oxygen is helping her with her symptoms  Past medical history-chronic systolic heart failure with EF 15%, status post AICD, CABG x2 in 2007 She smoked less than pack per day for  about 35 years before she quit in 2007  Significant tests/ events reviewed  Spirometry 09/2016 ratio 69, FEV1 of 75%, FVC of 85% Echo 09/2016 EF of 15%, RVSP of 52    Past Medical History:  Diagnosis Date  . Bronchitis   . CHF (congestive heart failure) (HCC)   . Hypertension   . Pneumonia    2008ish  . 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 CARDIAC SURGERY     double bypass 2007  . CORONARY ARTERY BYPASS GRAFT    . ICD IMPLANT N/A 12/29/2016   Procedure: ICD Implant;  Surgeon: Regan Lemming, MD;  Location: [facility] INVASIVE CV LAB;  Service: Cardiovascular;  Laterality: N/A;  . LIGATION OF ARTERIOVENOUS  FISTULA Left 12/08/2016   Procedure: LIGATION OF Left arm ARTERIOVENOUS  FISTULA;  Surgeon: Sherren Kerns, MD;  Location: [facility] OR;  Service: Vascular;  Laterality: Left;    Allergies  Allergen Reactions  . Shellfish Allergy Itching    Social History   Socioeconomic History  . Marital status: Single    Spouse name: Not on file  . Number of children: 5  . Years of education: Not on file  . Highest education level: Not on file  Occupational History  . Occupation: food  service    Employer: [employer]  Social Needs  . Financial resource strain: Not on file  . Food insecurity:    Worry: Not on file    Inability: Not on file  . Transportation needs:    Medical: Not on file    Non-medical: Not on file  Tobacco Use  . Smoking status: Former Smoker    Years: 30.00    Types: Cigarettes    Last attempt to quit: 05/31/2006    Years since quitting: 11.8  . Smokeless tobacco: Never Used  Substance and Sexual Activity  . Alcohol use: No  . Drug use: No  . Sexual activity: Not on file  Lifestyle  . Physical activity:    Days per week: Not on file    Minutes per session: Not on file  . Stress: Not on file  Relationships  . Social connections:    Talks on phone: Not  on file    Gets together: Not on file    Attends religious service: Not on file    Active member of club or organization: Not on file    Attends meetings of clubs or organizations: Not on file    Relationship status: Not on file  . Intimate partner violence:    Fear of current or ex partner: Not on file    Emotionally abused: Not on file    Physically abused: Not on file    Forced sexual activity: Not on file  Other Topics Concern  . Not on file  Social History Narrative  . Not on file      Family History  Problem Relation Age of Onset  . Diabetes Mother   . Brain cancer Mother   . Cancer Father        Neck  . Colon cancer Neg Hx   . Stomach cancer Neg Hx      Review of Systems  Constitutional: Negative for fever and unexpected weight change.  HENT: Negative for congestion, dental problem, ear pain, nosebleeds, postnasal drip, rhinorrhea, sinus pressure, sneezing, sore throat and trouble swallowing.   Eyes: Negative for redness and itching.  Respiratory: Positive for shortness of breath. Negative for cough, chest tightness and wheezing.   Cardiovascular: Negative for palpitations and leg swelling.  Gastrointestinal: Negative for nausea and vomiting.  Genitourinary: Negative for dysuria.  Musculoskeletal: Negative for joint swelling.  Skin: Negative for rash.  Allergic/Immunologic: Negative.  Negative for environmental allergies, food allergies and immunocompromised state.  Neurological: Negative for headaches.  Hematological: Does not bruise/bleed easily.  Psychiatric/Behavioral: Negative for dysphoric mood. The patient is not nervous/anxious.        Objective:   Physical Exam   Gen. Pleasant, thin, frail,, in no distress, normal affect ENT - no thrush, no post nasal drip Neck: No JVD, no thyromegaly, no carotid bruits Lungs: no use of accessory muscles, no dullness to percussion, decreased without rales or rhonchi  Cardiovascular: Rhythm regular, heart sounds   normal, no murmurs or gallops, no peripheral edema Abdomen: soft and non-tender, no hepatosplenomegaly, BS normal. Musculoskeletal: No deformities, no cyanosis or clubbing Neuro:  alert, non focal        Assessment & Plan:

## 2018-03-22 NOTE — Assessment & Plan Note (Signed)
Schedule PFTs-she only had mild obstruction on spirometry from 09/2016 This will enable [place] to also obtain baseline DLCO since she is on amiodarone  Ambulatory saturation -based on this we will decide if you qualify for portable concentrator Prescription for albuterol MDI 2 puffs every 6 hours as needed If FEV1 is worse, then will consider adding LIMA

## 2018-03-22 NOTE — Assessment & Plan Note (Signed)
She does seem to have sleep disordered breathing including nocturnal hypoxia.  Due to severe congestive heart failure she is also at risk for having central events and this will need attended polysomnogram. She is not willing to use a CPAP machine

## 2018-03-25 ENCOUNTER — Encounter: Payer: Self-pay | Admitting: Family Medicine

## 2018-03-28 ENCOUNTER — Other Ambulatory Visit: Payer: Self-pay | Admitting: Family Medicine

## 2018-03-28 ENCOUNTER — Encounter: Payer: Self-pay | Admitting: Family Medicine

## 2018-03-28 ENCOUNTER — Ambulatory Visit
Admission: RE | Admit: 2018-03-28 | Discharge: 2018-03-28 | Disposition: A | Discharge status: Home / Self Care | Payer: Medicare Other | Source: Ambulatory Visit | Attending: Family Medicine | Admitting: Family Medicine

## 2018-03-28 DIAGNOSIS — R748 Abnormal levels of other serum enzymes: Secondary | ICD-10-CM

## 2018-03-28 DIAGNOSIS — R932 Abnormal findings on diagnostic imaging of liver and biliary tract: Secondary | ICD-10-CM | POA: Insufficient documentation

## 2018-03-28 DIAGNOSIS — R188 Other ascites: Secondary | ICD-10-CM

## 2018-03-28 DIAGNOSIS — K824 Cholesterolosis of gallbladder: Secondary | ICD-10-CM | POA: Diagnosis not present

## 2018-03-28 DIAGNOSIS — K8020 Calculus of gallbladder without cholecystitis without obstruction: Secondary | ICD-10-CM | POA: Diagnosis not present

## 2018-03-28 HISTORY — DX: Abnormal findings on diagnostic imaging of liver and biliary tract: R93.2

## 2018-03-31 DIAGNOSIS — N183 Chronic kidney disease, stage 3 (moderate): Secondary | ICD-10-CM | POA: Diagnosis not present

## 2018-04-01 ENCOUNTER — Ambulatory Visit (INDEPENDENT_AMBULATORY_CARE_PROVIDER_SITE_OTHER): Payer: Medicare Other | Admitting: Gastroenterology

## 2018-04-01 ENCOUNTER — Encounter: Payer: Self-pay | Admitting: Gastroenterology

## 2018-04-01 ENCOUNTER — Other Ambulatory Visit (INDEPENDENT_AMBULATORY_CARE_PROVIDER_SITE_OTHER): Payer: Medicare Other

## 2018-04-01 VITALS — BP 110/66 | HR 73 | Ht 64.0 in | Wt 136.0 lb

## 2018-04-01 DIAGNOSIS — R188 Other ascites: Secondary | ICD-10-CM

## 2018-04-01 DIAGNOSIS — R945 Abnormal results of liver function studies: Secondary | ICD-10-CM | POA: Diagnosis not present

## 2018-04-01 DIAGNOSIS — R7989 Other specified abnormal findings of blood chemistry: Secondary | ICD-10-CM

## 2018-04-01 LAB — CBC WITH DIFFERENTIAL/PLATELET
BASOS ABS: 0 10*3/uL (ref 0.0–0.1)
Basophils Relative: 0.6 % (ref 0.0–3.0)
EOS ABS: 0.1 10*3/uL (ref 0.0–0.7)
Eosinophils Relative: 0.9 % (ref 0.0–5.0)
HEMATOCRIT: 36.9 % (ref 36.0–46.0)
HEMOGLOBIN: 12 g/dL (ref 12.0–15.0)
LYMPHS PCT: 11.5 % — AB (ref 12.0–46.0)
Lymphs Abs: 0.9 10*3/uL (ref 0.7–4.0)
MCHC: 32.5 g/dL (ref 30.0–36.0)
MCV: 105.6 fl — AB (ref 78.0–100.0)
MONOS PCT: 8.3 % (ref 3.0–12.0)
Monocytes Absolute: 0.6 10*3/uL (ref 0.1–1.0)
NEUTROS ABS: 6 10*3/uL (ref 1.4–7.7)
Neutrophils Relative %: 78.7 % — ABNORMAL HIGH (ref 43.0–77.0)
PLATELETS: 178 10*3/uL (ref 150.0–400.0)
RBC: 3.49 Mil/uL — AB (ref 3.87–5.11)
RDW: 15.4 % (ref 11.5–15.5)
WBC: 7.6 10*3/uL (ref 4.0–10.5)

## 2018-04-01 LAB — IRON: Iron: 120 ug/dL (ref 42–145)

## 2018-04-01 LAB — COMPREHENSIVE METABOLIC PANEL
ALBUMIN: 3.9 g/dL (ref 3.5–5.2)
ALK PHOS: 169 U/L — AB (ref 39–117)
ALT: 11 U/L (ref 0–35)
AST: 14 U/L (ref 0–37)
BILIRUBIN TOTAL: 1.7 mg/dL — AB (ref 0.2–1.2)
BUN: 29 mg/dL — ABNORMAL HIGH (ref 6–23)
CO2: 32 mEq/L (ref 19–32)
CREATININE: 1.44 mg/dL — AB (ref 0.40–1.20)
Calcium: 9.5 mg/dL (ref 8.4–10.5)
Chloride: 101 mEq/L (ref 96–112)
GFR: 46.38 mL/min — AB (ref >60.00)
Glucose, Bld: 149 mg/dL — ABNORMAL HIGH (ref 70–99)
Potassium: 3.1 mEq/L — ABNORMAL LOW (ref 3.5–5.1)
Sodium: 142 mEq/L (ref 135–145)
TOTAL PROTEIN: 7.2 g/dL (ref 6.0–8.3)

## 2018-04-01 LAB — PROTIME-INR
INR: 1.4 ratio — ABNORMAL HIGH (ref 0.8–1.0)
Prothrombin Time: 16.6 s — ABNORMAL HIGH (ref 9.6–13.1)

## 2018-04-01 LAB — FERRITIN: Ferritin: 119.8 ng/mL (ref 10.0–291.0)

## 2018-04-01 NOTE — Patient Instructions (Addendum)
You will have labs checked today in the basement lab.  Please head down after you check out with the front desk : Hepatitis A (IgM and IgG), Hepatitis B surface antigen, Hepatitis B surface antibody, Hepatitis C antibody, total iron, ferritin, TIBC, ANA, AMA, alphafeto protein (AFP), anti smooth muscle antibody, alpha 1 antitrypsin, cerulloplasm, CBC, CMET, INR.  IR diagnostic paracentesis (please remove 500cc): send ascites fluid for cytology, cell count and differential, albumin, protein.   US liver elastography (already had standard [place] and doesn't need that again, just elastography component).  Please return to see Dr. Christella Hartigan in 2 months, sooner if needed. On 06/01/18 at 1:45  Fluid management by cardiology team..  You have been scheduled for an abdominal paracentesis at [facility] (1st floor of hospital) on 04/05/18 at 10am. Please arrive at least 15 minutes prior to your appointment time for registration. Should you need to reschedule this appointment for any reason, please call our office at [phone]  You have been scheduled for an abdominal ultrasound at [facility] (1st floor of hospital) on 04/08/18 at 10am. Please arrive 15 minutes prior to your appointment for registration. Make certain not to have anything to eat or drink 6 hours prior to your appointment. Should you need to reschedule your appointment, please contact radiology at [phone]. This test typically takes about 30 minutes to perform.

## 2018-04-01 NOTE — Addendum Note (Signed)
Addended by: Trellis Paganini D on: 04/01/2018 11:14 AM   Modules accepted: Orders

## 2018-04-01 NOTE — Progress Notes (Signed)
HPI: This is a very pleasant 67 year old woman who was referred to me by Harland Dingwall L, NP-C  to evaluate abnormal liver tests, abnormal liver ultrasound.    For at least a year or 2 her alk phos has been slightly elevated.  Her total bilirubin has also been slightly elevated.  She recently had an abdominal ultrasound and it suggested possible cirrhosis.  There was also a moderate amount of abdominal ascites   Chronic systolic HF: EF is 15% based on echocardiogram 2017  4 months of abdominal swelling; this is new.  No abdominal pains.  She has had no fevers or chills  Sees Dr. Nadyne Coombes who manages her cardiac disease and her diuretics.  Briefly increased her diurects.  She takes ibuprofen qod.  She is on oxygen at night but does not need it during the day  Chief complaint is abnormal liver tests, abnormal liver on ultrasound   Old Data Reviewed:  [place] 03/2018: done for elevated liver tests, ascites Gallstones and polyps. Gallbladder wall thickening. No positive sonographic Murphy's sign. The findings may likely reflect chronic cholecystitis and cholelithiasis. Heterogeneous hepatic echotexture compatible with the pattern cellular disease, possibly cirrhosis. No suspicious hepatic masses. Moderate amount of intra-abdominal ascites.  LFTS:  03/2018: T bili 1.3, alk phos 254, ast/alt normal  07/2017 tbili 1.6, alk phos 152  Spirometry 09/2016 ratio 69, FEV1 of 75%, FVC of 85% Echo 09/2016 EF of 15%, RVSP of 52  Review of systems: Pertinent positive and negative review of systems were noted in the above HPI section. All other review negative.   Past Medical History:  Diagnosis Date  . Abnormal ultrasound of liver 03/28/2018  . Bronchitis   . CHF (congestive heart failure) ([place])   . Hypertension   . Pneumonia    2008ish  . Uncontrolled diabetes mellitus with complications ([place])    Type II    Past Surgical History:  Procedure Laterality Date  . CARDIAC SURGERY     double  bypass 2007  . CORONARY ARTERY BYPASS GRAFT    . ICD IMPLANT N/A 12/29/2016   Procedure: ICD Implant;  Surgeon: Constance Haw, MD;  Location: [facility] LAB;  Service: Cardiovascular;  Laterality: N/A;  . LIGATION OF ARTERIOVENOUS  FISTULA Left 12/08/2016   Procedure: LIGATION OF Left arm ARTERIOVENOUS  FISTULA;  Surgeon: Elam Dutch, MD;  Location: [facility] OR;  Service: Vascular;  Laterality: Left;    Current Outpatient Medications  Medication Sig Dispense Refill  . aspirin 81 MG chewable tablet Chew 1 tablet (81 mg total) by mouth daily. 60 tablet 2  . atorvastatin (LIPITOR) 80 MG tablet Take 1 tablet (80 mg total) by mouth daily. 90 tablet 2  . ENTRESTO 97-103 MG Take 1 tablet by mouth 2 (two) times daily.     . Glucose Blood (BLOOD GLUCOSE TEST STRIPS) STRP Test twice a day. Pt uses one touch ultra mini meter 200 each 1  . Insulin Glargine (LANTUS SOLOSTAR) 100 UNIT/ML Solostar Pen Inject 10 Units into the skin daily at 10 pm. (Patient taking differently: Inject 5 Units into the skin daily at 10 pm. ) 2 pen 0  . Insulin Pen Needle 31G X 5 MM MISC As directed 100 each 2  . KLOR-CON M20 20 MEQ tablet TAKE ONE TABLET BY MOUTH WHEN YOU TAKE LASIX (Patient taking differently: Take 1 a tablet with torsemide) 30 tablet 2  . metolazone (ZAROXOLYN) 5 MG tablet TAKE 1 TABLET BY MOUTH ONCE DAILY IN THE  MORNING FOR 5 DAYS. THEN STOP  0  . metoprolol tartrate (LOPRESSOR) 25 MG tablet Take 25 mg by mouth daily.  2  . torsemide (DEMADEX) 20 MG tablet Take 20 mg by mouth 2 (two) times daily.   3  . Vitamin D, Cholecalciferol, 1000 units TABS Take 2,000 Units by mouth every other day.     No current facility-administered medications for this visit.     Allergies as of 04/01/2018 - Review Complete 04/01/2018  Allergen Reaction Noted  . Shellfish allergy Itching 09/01/2012    Family History  Problem Relation Age of Onset  . Diabetes Mother   . Brain cancer Mother   . Cancer Father         Neck  . Colon cancer Neg Hx   . Stomach cancer Neg Hx     Social History   Socioeconomic History  . Marital status: Single    Spouse name: Not on file  . Number of children: 5  . Years of education: Not on file  . Highest education level: Not on file  Occupational History  . Occupation: food Location manager: [employer]  . Financial resource strain: Not on file  . Food insecurity:    Worry: Not on file    Inability: Not on file  . Transportation needs:    Medical: Not on file    Non-medical: Not on file  Tobacco Use  . Smoking status: Former Smoker    Years: 30.00    Types: Cigarettes    Last attempt to quit: 05/31/2006    Years since quitting: 11.8  . Smokeless tobacco: Never Used  Substance and Sexual Activity  . Alcohol use: No  . Drug use: No  . Sexual activity: Not on file  Lifestyle  . Physical activity:    Days per week: Not on file    Minutes per session: Not on file  . Stress: Not on file  Relationships  . Social connections:    Talks on phone: Not on file    Gets together: Not on file    Attends religious service: Not on file    Active member of club or organization: Not on file    Attends meetings of clubs or organizations: Not on file    Relationship status: Not on file  . Intimate partner violence:    Fear of current or ex partner: Not on file    Emotionally abused: Not on file    Physically abused: Not on file    Forced sexual activity: Not on file  Other Topics Concern  . Not on file  Social History Narrative  . Not on file     Physical Exam: BP 110/66   Pulse 73   Ht '5\' 4"'$  (1.626 m)   Wt 136 lb (61.7 kg)   BMI 23.34 kg/m  Constitutional: generally well-appearing Psychiatric: alert and oriented x3 Eyes: extraocular movements intact Mouth: oral pharynx moist, no lesions Neck: supple no lymphadenopathy Cardiovascular: heart regular rate and rhythm Lungs: clear to auscultation bilaterally Abdomen:  soft, nontender, nondistended, mild obvious ascites no peritoneal signs, normal bowel sounds Extremities: 1+ lower extremity edema bilaterally Skin: no lesions on visible extremities   Assessment and plan: 67 y.o. female with elevated alkaline phosphatase, abnormal appearing liver on ultrasound, abdominal swelling with ascites  First I am not sure if she truly has underlying cirrhosis or not.  I am going to order an ultrasound to elastography to  see if that will help answer that question.  Her platelets are normal and so if she does she probably does not have significant portal hypertension.  With her elevated total bilirubin and elevated alkaline phosphatase she needs a battery of blood tests to check for cause.  See those in the patient instructions below.  I think it is very possible that her liver issues are related to her significant systolic heart failure, she has an ejection fraction of 15%.  This can cause cardiogenic liver disease, even progressing to cirrhosis.  I am going to order a diagnostic paracentesis which may help answer the question of why she has ascites based on ascites fluid testing.  I think it is definitely safest if her fluid management is undertaken by only one team and that should be her cardiology team since they already manage it from her CHF perspective.  She does have some 1+ edema in her legs as well as mild to moderate ascites and so certainly her diuretics could be increased if they think it is possible based on creatinine and electrolytes.  I will leave that to their discretion.  She will return to see me in 2 months and sooner if any issues.    Please see the "Patient Instructions" section for addition details about the plan.   Owens Loffler, MD [facility] 04/01/2018, 10:08 AM  Cc: Henson, Vickie L, NP-C

## 2018-04-02 LAB — IRON AND TIBC
IRON SATURATION: 29 % (ref 15–55)
IRON: 116 ug/dL (ref 27–139)
TIBC: 396 ug/dL (ref 250–450)
UIBC: 280 ug/dL (ref 118–369)

## 2018-04-05 ENCOUNTER — Encounter (HOSPITAL_COMMUNITY): Payer: Self-pay | Admitting: Student

## 2018-04-05 ENCOUNTER — Ambulatory Visit (HOSPITAL_COMMUNITY)
Admission: RE | Admit: 2018-04-05 | Discharge: 2018-04-05 | Disposition: A | Discharge status: Home / Self Care | Payer: Medicare Other | Source: Ambulatory Visit | Attending: Gastroenterology | Admitting: Gastroenterology

## 2018-04-05 DIAGNOSIS — R748 Abnormal levels of other serum enzymes: Secondary | ICD-10-CM | POA: Insufficient documentation

## 2018-04-05 DIAGNOSIS — R945 Abnormal results of liver function studies: Secondary | ICD-10-CM

## 2018-04-05 DIAGNOSIS — R7989 Other specified abnormal findings of blood chemistry: Secondary | ICD-10-CM

## 2018-04-05 DIAGNOSIS — R188 Other ascites: Secondary | ICD-10-CM

## 2018-04-05 HISTORY — PX: IR PARACENTESIS: IMG2679

## 2018-04-05 LAB — BODY FLUID CELL COUNT WITH DIFFERENTIAL
EOS FL: 0 %
Lymphs, Fluid: 61 %
Monocyte-Macrophage-Serous Fluid: 35 % — ABNORMAL LOW (ref 50–90)
Neutrophil Count, Fluid: 4 % (ref 0–25)
WBC FLUID: 310 uL (ref 0–1000)

## 2018-04-05 LAB — ALBUMIN, PLEURAL OR PERITONEAL FLUID: ALBUMIN FL: 2.2 g/dL

## 2018-04-05 LAB — AFP TUMOR MARKER: AFP-Tumor Marker: 5.2 ng/mL

## 2018-04-05 LAB — HEPATITIS C ANTIBODY
Hepatitis C Ab: NONREACTIVE
SIGNAL TO CUT-OFF: 0.1 (ref <1.00)

## 2018-04-05 LAB — ANA: Anti Nuclear Antibody(ANA): NEGATIVE

## 2018-04-05 LAB — CERULOPLASMIN: CERULOPLASMIN: 46 mg/dL (ref 18–53)

## 2018-04-05 LAB — IGM: IgM, Serum: 54 mg/dL (ref 50–300)

## 2018-04-05 LAB — PROTEIN, PLEURAL OR PERITONEAL FLUID: TOTAL PROTEIN, FLUID: 3.7 g/dL

## 2018-04-05 LAB — ANTI-SMOOTH MUSCLE ANTIBODY, IGG

## 2018-04-05 LAB — HEPATITIS B SURFACE ANTIBODY,QUALITATIVE: Hep B S Ab: NONREACTIVE

## 2018-04-05 LAB — MITOCHONDRIAL ANTIBODIES: MITOCHONDRIAL M2 AB, IGG: 128.9 U — AB

## 2018-04-05 LAB — IGG: IgG (Immunoglobin G), Serum: 1232 mg/dL (ref 600–1540)

## 2018-04-05 LAB — HEPATITIS A ANTIBODY, TOTAL: HEPATITIS A AB,TOTAL: NONREACTIVE

## 2018-04-05 LAB — ALPHA-1-ANTITRYPSIN: A-1 Antitrypsin, Ser: 235 mg/dL — ABNORMAL HIGH (ref 83–199)

## 2018-04-05 MED ORDER — LIDOCAINE HCL (PF) 2 % IJ SOLN
INTRAMUSCULAR | Status: DC | PRN
  Administered 2018-04-05: 10 mL

## 2018-04-05 MED ORDER — LIDOCAINE HCL (PF) 2 % IJ SOLN
INTRAMUSCULAR | Status: AC
  Filled 2018-04-05: qty 20

## 2018-04-05 NOTE — Procedures (Signed)
PROCEDURE SUMMARY:  Successful US guided paracentesis from right lateral abdomen.  Yielded 1.4 liters of yellow fluid.  No immediate complications.  Pt tolerated well.   Specimen was sent for labs.  Hoyt Koch PA-C 04/05/2018 11:37 AM

## 2018-04-08 ENCOUNTER — Ambulatory Visit (HOSPITAL_COMMUNITY)
Admission: RE | Admit: 2018-04-08 | Discharge: 2018-04-08 | Disposition: A | Discharge status: Home / Self Care | Payer: Medicare Other | Source: Ambulatory Visit | Attending: Gastroenterology | Admitting: Gastroenterology

## 2018-04-08 DIAGNOSIS — R945 Abnormal results of liver function studies: Secondary | ICD-10-CM | POA: Insufficient documentation

## 2018-04-08 DIAGNOSIS — I129 Hypertensive chronic kidney disease with stage 1 through stage 4 chronic kidney disease, or unspecified chronic kidney disease: Secondary | ICD-10-CM | POA: Diagnosis not present

## 2018-04-08 DIAGNOSIS — K7460 Unspecified cirrhosis of liver: Secondary | ICD-10-CM | POA: Diagnosis not present

## 2018-04-08 DIAGNOSIS — K760 Fatty (change of) liver, not elsewhere classified: Secondary | ICD-10-CM | POA: Diagnosis not present

## 2018-04-08 DIAGNOSIS — N183 Chronic kidney disease, stage 3 (moderate): Secondary | ICD-10-CM | POA: Diagnosis not present

## 2018-04-08 DIAGNOSIS — R7989 Other specified abnormal findings of blood chemistry: Secondary | ICD-10-CM

## 2018-04-08 DIAGNOSIS — N289 Disorder of kidney and ureter, unspecified: Secondary | ICD-10-CM | POA: Diagnosis not present

## 2018-04-08 DIAGNOSIS — R188 Other ascites: Secondary | ICD-10-CM | POA: Diagnosis not present

## 2018-04-08 DIAGNOSIS — K7689 Other specified diseases of liver: Secondary | ICD-10-CM | POA: Insufficient documentation

## 2018-04-12 DIAGNOSIS — Z4502 Encounter for adjustment and management of automatic implantable cardiac defibrillator: Secondary | ICD-10-CM | POA: Diagnosis not present

## 2018-04-12 DIAGNOSIS — Z95810 Presence of automatic (implantable) cardiac defibrillator: Secondary | ICD-10-CM | POA: Diagnosis not present

## 2018-04-19 DIAGNOSIS — I5043 Acute on chronic combined systolic (congestive) and diastolic (congestive) heart failure: Secondary | ICD-10-CM | POA: Diagnosis not present

## 2018-04-19 DIAGNOSIS — I129 Hypertensive chronic kidney disease with stage 1 through stage 4 chronic kidney disease, or unspecified chronic kidney disease: Secondary | ICD-10-CM | POA: Diagnosis not present

## 2018-04-19 DIAGNOSIS — I2510 Atherosclerotic heart disease of native coronary artery without angina pectoris: Secondary | ICD-10-CM | POA: Diagnosis not present

## 2018-04-19 DIAGNOSIS — N184 Chronic kidney disease, stage 4 (severe): Secondary | ICD-10-CM | POA: Diagnosis not present

## 2018-04-21 ENCOUNTER — Encounter (HOSPITAL_BASED_OUTPATIENT_CLINIC_OR_DEPARTMENT_OTHER): Payer: Medicare Other

## 2018-04-22 ENCOUNTER — Ambulatory Visit (HOSPITAL_BASED_OUTPATIENT_CLINIC_OR_DEPARTMENT_OTHER): Payer: Medicare Other | Attending: Pulmonary Disease | Admitting: Pulmonary Disease

## 2018-04-22 DIAGNOSIS — I2510 Atherosclerotic heart disease of native coronary artery without angina pectoris: Secondary | ICD-10-CM | POA: Diagnosis not present

## 2018-04-22 DIAGNOSIS — R0902 Hypoxemia: Secondary | ICD-10-CM | POA: Insufficient documentation

## 2018-04-22 DIAGNOSIS — G4733 Obstructive sleep apnea (adult) (pediatric): Secondary | ICD-10-CM | POA: Diagnosis not present

## 2018-04-22 DIAGNOSIS — I493 Ventricular premature depolarization: Secondary | ICD-10-CM | POA: Insufficient documentation

## 2018-04-22 DIAGNOSIS — G4730 Sleep apnea, unspecified: Secondary | ICD-10-CM

## 2018-04-25 ENCOUNTER — Encounter: Payer: Self-pay | Admitting: Adult Health

## 2018-04-25 ENCOUNTER — Other Ambulatory Visit (INDEPENDENT_AMBULATORY_CARE_PROVIDER_SITE_OTHER): Payer: Medicare Other

## 2018-04-25 ENCOUNTER — Telehealth: Payer: Self-pay | Admitting: Pulmonary Disease

## 2018-04-25 ENCOUNTER — Ambulatory Visit (INDEPENDENT_AMBULATORY_CARE_PROVIDER_SITE_OTHER): Payer: Medicare Other | Admitting: Adult Health

## 2018-04-25 ENCOUNTER — Ambulatory Visit (INDEPENDENT_AMBULATORY_CARE_PROVIDER_SITE_OTHER): Payer: Medicare Other | Admitting: Pulmonary Disease

## 2018-04-25 DIAGNOSIS — G4733 Obstructive sleep apnea (adult) (pediatric): Secondary | ICD-10-CM | POA: Diagnosis not present

## 2018-04-25 DIAGNOSIS — J9611 Chronic respiratory failure with hypoxia: Secondary | ICD-10-CM

## 2018-04-25 DIAGNOSIS — R0602 Shortness of breath: Secondary | ICD-10-CM | POA: Diagnosis not present

## 2018-04-25 DIAGNOSIS — J449 Chronic obstructive pulmonary disease, unspecified: Secondary | ICD-10-CM | POA: Diagnosis not present

## 2018-04-25 DIAGNOSIS — E1165 Type 2 diabetes mellitus with hyperglycemia: Secondary | ICD-10-CM | POA: Diagnosis not present

## 2018-04-25 LAB — PULMONARY FUNCTION TEST
DL/VA % PRED: 30 %
DL/VA: 1.55 ml/min/mmHg/L
DLCO COR % PRED: 18 %
DLCO COR: 4.94 ml/min/mmHg
DLCO unc % pred: 17 %
DLCO unc: 4.71 ml/min/mmHg
FEF 25-75 Post: 0.78 L/sec
FEF 25-75 Pre: 0.65 L/sec
FEF2575-%CHANGE-POST: 20 %
FEF2575-%Pred-Post: 41 %
FEF2575-%Pred-Pre: 34 %
FEV1-%Change-Post: 1 %
FEV1-%PRED-PRE: 60 %
FEV1-%Pred-Post: 61 %
FEV1-POST: 1.25 L
FEV1-Pre: 1.23 L
FEV1FVC-%CHANGE-POST: 1 %
FEV1FVC-%Pred-Pre: 85 %
FEV6-%Change-Post: 0 %
FEV6-%PRED-PRE: 72 %
FEV6-%Pred-Post: 72 %
FEV6-POST: 1.83 L
FEV6-PRE: 1.82 L
FEV6FVC-%Change-Post: 0 %
FEV6FVC-%PRED-POST: 104 %
FEV6FVC-%PRED-PRE: 103 %
FVC-%CHANGE-POST: 0 %
FVC-%PRED-POST: 69 %
FVC-%PRED-PRE: 70 %
FVC-POST: 1.83 L
FVC-PRE: 1.83 L
PRE FEV1/FVC RATIO: 67 %
Post FEV1/FVC ratio: 68 %
Post FEV6/FVC ratio: 100 %
Pre FEV6/FVC Ratio: 99 %
RV % PRED: 87 %
RV: 2 L
TLC % PRED: 71 %
TLC: 3.85 L

## 2018-04-25 LAB — COMPREHENSIVE METABOLIC PANEL
ALBUMIN: 4.1 g/dL (ref 3.5–5.2)
ALT: 23 U/L (ref 0–35)
AST: 29 U/L (ref 0–37)
Alkaline Phosphatase: 180 U/L — ABNORMAL HIGH (ref 39–117)
BUN: 32 mg/dL — AB (ref 6–23)
CALCIUM: 9.7 mg/dL (ref 8.4–10.5)
CO2: 31 meq/L (ref 19–32)
CREATININE: 1.61 mg/dL — AB (ref 0.40–1.20)
Chloride: 103 mEq/L (ref 96–112)
GFR: 40.76 mL/min — AB (ref >60.00)
Glucose, Bld: 102 mg/dL — ABNORMAL HIGH (ref 70–99)
Potassium: 3.5 mEq/L (ref 3.5–5.1)
Sodium: 143 mEq/L (ref 135–145)
Total Bilirubin: 1.4 mg/dL — ABNORMAL HIGH (ref 0.2–1.2)
Total Protein: 7.4 g/dL (ref 6.0–8.3)

## 2018-04-25 LAB — HEMOGLOBIN A1C: HEMOGLOBIN A1C: 8.1 % — AB (ref 4.6–6.5)

## 2018-04-25 NOTE — Progress Notes (Signed)
@Patient  ID: Rachel Oneill, female    DOB: 1951/06/02, 67 y.o.   MRN: [id number]  Chief Complaint  Patient presents with  . Follow-up    Dyspnea     Referring provider: Avanell Shackleton, NP-C  HPI: 67 year old female former smoker seen for pulmonary consult March 22, 2018 for hypoxia. She has chronic systolic heart failure.  (EF 15%), status post AICD, CABG x2 in 2007   Significant tests/ events reviewed  Spirometry 09/2016 ratio 69, FEV1 of 75%, FVC of 85% Echo 09/2016 EF of 15%, RVSP of 52   04/25/2018 Follow up ; Hypoxia  Patient returns for a one-month follow-up.  Patient was seen last visit for pulmonary consult.  Patient has been noted to have nocturnal hypoxia at her primary care physician's office.  And was started on oxygen around 2 months ago.  sHe was referred to pulmonary for evaluation.  Patient has underlying chronic systolic congestive heart failure with EF at 15%.. She is managed by cardiology.. Patient does have a smoking history.  PFT today shows Moderate airflow obstruction with an FEV1 at 61%, ratio 68, no significant bronchodilator response.  Decreased mid flows with positive reversibility.  DLCO 17%. Denies significant cough . Gets winded with activity . No dyspnea at rest  Previous cafeteria worker.  Smoked for long time for ~1 PPD   She was set up for a sleep study.  This was done on September 13 that showed severe sleep apnea AHI 32/hour mild oxygen desaturations at 81%..  Has Oxygen at home . Nothing portable , going to DME for POC evaluation   She is currently being evaluated by gastroenterology for cirrhosis.. Recent abdominal ultrasound showed nodular hepatic contour suspicious for cirrhosis.  Patient did have ascites and underwent a paracentesis on August 27 with 1.4 L removed otology showed negative malignant cells.  Reactive mesothelial cells were noted.   Allergies  Allergen Reactions  . Shellfish Allergy Itching    Immunization History    Administered Date(s) Administered  . Influenza Split 05/10/2017  . Influenza-Unspecified 05/11/2015, 05/10/2016  . Pneumococcal Conjugate-13 08/06/2016    Past Medical History:  Diagnosis Date  . Abnormal ultrasound of liver 03/28/2018  . Bronchitis   . CHF (congestive heart failure) (HCC)   . Hypertension   . Pneumonia    2008ish  . Uncontrolled diabetes mellitus with complications (HCC)    Type II    Tobacco History: Social History   Tobacco Use  Smoking Status Former Smoker  . Years: 30.00  . Types: Cigarettes  . Last attempt to quit: 05/31/2006  . Years since quitting: 11.9  Smokeless Tobacco Never Used   Counseling given: Not Answered   Outpatient Medications Prior to Visit  Medication Sig Dispense Refill  . aspirin 81 MG chewable tablet Chew 1 tablet (81 mg total) by mouth daily. 60 tablet 2  . atorvastatin (LIPITOR) 80 MG tablet Take 1 tablet (80 mg total) by mouth daily. 90 tablet 2  . ENTRESTO 97-103 MG Take 1 tablet by mouth 2 (two) times daily.     . Glucose Blood (BLOOD GLUCOSE TEST STRIPS) STRP Test twice a day. Pt uses one touch ultra mini meter 200 each 1  . Insulin Glargine (LANTUS SOLOSTAR) 100 UNIT/ML Solostar Pen Inject 10 Units into the skin daily at 10 pm. (Patient taking differently: Inject 5 Units into the skin daily at 10 pm. ) 2 pen 0  . Insulin Pen Needle 31G X 5 MM MISC As directed 100  each 2  . KLOR-CON M20 20 MEQ tablet TAKE ONE TABLET BY MOUTH WHEN YOU TAKE LASIX (Patient taking differently: Take 1 a tablet with torsemide) 30 tablet 2  . metolazone (ZAROXOLYN) 5 MG tablet TAKE 1 TABLET BY MOUTH ONCE DAILY IN THE MORNING FOR 5 DAYS. THEN STOP  0  . metoprolol tartrate (LOPRESSOR) 25 MG tablet Take 25 mg by mouth daily.  2  . torsemide (DEMADEX) 20 MG tablet Take 20 mg by mouth 2 (two) times daily.   3  . Vitamin D, Cholecalciferol, 1000 units TABS Take 2,000 Units by mouth every other day.     No facility-administered medications prior to  visit.      Review of Systems  Constitutional:   No  weight loss, night sweats,  Fevers, chills,  +fatigue, or  lassitude.  HEENT:   No headaches,  Difficulty swallowing,  Tooth/dental problems, or  Sore throat,                No sneezing, itching, ear ache, nasal congestion, post nasal drip,   CV:  No chest pain,  Orthopnea, PND,  anasarca, dizziness, palpitations, syncope.   GI  No heartburn, indigestion, abdominal pain, nausea, vomiting, diarrhea, change in bowel habits, loss of appetite, bloody stools.   Resp:    No chest wall deformity  Skin: no rash or lesions.  GU: no dysuria, change in color of urine, no urgency or frequency.  No flank pain, no hematuria   MS:  No joint pain or swelling.  No decreased range of motion.  No back pain.    Physical Exam  BP 124/66 (BP Location: Left Arm, Cuff Size: Normal)   Pulse 92   Ht 5\' 6"  (1.676 m)   Wt 143 lb (64.9 kg)   SpO2 95%   BMI 23.08 kg/m   GEN: A/Ox3; pleasant , NAD, elderly   HEENT:  [place]/AT,  EACs-clear, TMs-wnl, NOSE-clear, THROAT-clear, no lesions, no postnasal drip or exudate noted.   NECK:  Supple w/ fair ROM; no JVD; normal carotid impulses w/o bruits; no thyromegaly or nodules palpated; no lymphadenopathy.    RESP  Clear  P & A; w/o, wheezes/ rales/ or rhonchi. no accessory muscle use, no dullness to percussion  CARD:  RRR, no m/r/g, 1+ peripheral edema, pulses intact, no cyanosis or clubbing.  GI:  nml bowel sounds; distended no organomegaly or masses detected.   Musco: Warm bil, no deformities or joint swelling noted.   Neuro: alert, no focal deficits noted.    Skin: Warm, no lesions or rashes    Lab Results:  CBC    Component Value Date/Time   WBC 7.6 04/01/2018 1114   RBC 3.49 (L) 04/01/2018 1114   HGB 12.0 04/01/2018 1114   HGB 11.0 (L) 03/17/2018 1638   HCT 36.9 04/01/2018 1114   HCT 34.2 03/17/2018 1638   PLT 178.0 04/01/2018 1114   PLT 207 03/17/2018 1638   MCV 105.6 (H) 04/01/2018  1114   MCV 105 (H) 03/17/2018 1638   MCH 33.7 (H) 03/17/2018 1638   MCH 34.7 (H) 12/20/2017 2127   MCHC 32.5 04/01/2018 1114   RDW 15.4 04/01/2018 1114   RDW 14.8 03/17/2018 1638   LYMPHSABS 0.9 04/01/2018 1114   LYMPHSABS 0.9 03/17/2018 1638   MONOABS 0.6 04/01/2018 1114   EOSABS 0.1 04/01/2018 1114   EOSABS 0.1 03/17/2018 1638   BASOSABS 0.0 04/01/2018 1114   BASOSABS 0.0 03/17/2018 1638    BMET  Component Value Date/Time   NA 142 04/01/2018 1114   NA 144 03/17/2018 1638   K 3.1 (L) 04/01/2018 1114   CL 101 04/01/2018 1114   CO2 32 04/01/2018 1114   GLUCOSE 149 (H) 04/01/2018 1114   BUN 29 (H) 04/01/2018 1114   BUN 32 (H) 03/17/2018 1638   CREATININE 1.44 (H) 04/01/2018 1114   CREATININE 1.19 (H) 03/23/2017 0731   CALCIUM 9.5 04/01/2018 1114   GFRNONAA 40 (L) 03/17/2018 1638   GFRNONAA 47 (L) 03/23/2017 0731   GFRAA 46 (L) 03/17/2018 1638   GFRAA 54 (L) 03/23/2017 0731    BNP    Component Value Date/Time   BNP 3,179.7 (H) 10/06/2017 1000    ProBNP    Component Value Date/Time   PROBNP 63.2 03/28/2010 2024    Imaging: US Abdomen Complete W/elastography  Result Date: 04/08/2018 CLINICAL DATA:  Ascites, elevated LFTs, history CHF, hypertension, type II diabetes mellitus, coronary artery disease, ischemic cardiomyopathy, stage III chronic kidney disease, former smoker EXAM: ULTRASOUND ABDOMEN ULTRASOUND HEPATIC ELASTOGRAPHY TECHNIQUE: Sonography of the upper abdomen was performed. In addition, ultrasound elastography evaluation of the liver was performed. A region of interest was placed within the right lobe of the liver. Following application of a compressive sonographic pulse, shear waves were detected in the adjacent hepatic tissue and the shear wave velocity was calculated. Multiple assessments were performed at the selected site. Median shear wave velocity is correlated to a Metavir fibrosis score. COMPARISON:  None FINDINGS: ULTRASOUND ABDOMEN Gallbladder:  Normally distended. Foci of increased echogenicity with ring down artifact are seen at the gallbladder wall question adenomyosis. Gallbladder wall thickening at 5 mm thick. Intraluminal echogenic foci are identified, some which shadow, may represent a combination of calculi and sludge balls. No sonographic Murphy sign or pericholecystic fluid. Common bile duct: Diameter: Measured value of 9 mm is more at the midportion of the CBD. Proximal portion not measured. No associated intrahepatic biliary dilatation. Liver: Nodular hepatic contour. Upper normal echogenicity. No definite mass lesion. No intrahepatic biliary dilatation. Normal appearance IVC: Normal appearance Pancreas: Normal appearance Spleen: Normal appearance, 8.2 cm length Right Kidney: Length: 11.3 cm. Normal cortical thickness. Increased cortical echogenicity. Tiny cyst at upper pole 9 x 7 x 7 mm. No additional mass or hydronephrosis. Left Kidney: Length: 9.7 cm. Normal cortical thickness. Increased cortical echogenicity. Small cyst at upper pole 16 x 15 x 17 mm, simple features. No mass or hydronephrosis. Abdominal aorta: Atherosclerotic changes and mild ectasia without aneurysm Other findings: No free fluid ULTRASOUND HEPATIC ELASTOGRAPHY Device: Siemens Helix VTQ Patient position: Supine Transducer 5C1 Number of measurements: 10 Hepatic segment:  7 Median velocity:   3.20 m/sec IQR: 0.38 IQR/Median velocity ratio: 0.12 Corresponding Metavir fibrosis score:  Some F3 + F4 Risk of fibrosis: High Limitations of exam: None Please note that abnormal shear wave velocities may also be identified in clinical settings other than with hepatic fibrosis, such as: acute hepatitis, elevated right heart and central venous pressures including use of beta blockers, veno-occlusive disease (Budd-Chiari), infiltrative processes such as mastocytosis/amyloidosis/infiltrative tumor, extrahepatic cholestasis, in the post-prandial state, and liver transplantation. Correlation  with patient history, laboratory data, and clinical condition recommended. IMPRESSION: ULTRASOUND ABDOMEN: Nodular cirrhotic appearing liver. Suspected medical renal disease changes of both kidneys. Gallbladder wall thickening with probable calculi and question sludge balls with foci of ring down artifact suggesting adenomyosis. ULTRASOUND HEPATIC ELASTOGRAPHY: Median hepatic shear wave velocity is calculated at 3.20 m/sec. Corresponding Metavir fibrosis score is Some F3 +  F4. Risk of fibrosis is High. Follow-up: Follow up advised Electronically Signed   By: Ulyses Southward M.D.   On: 04/08/2018 14:44   US Abdomen Limited Ruq  Result Date: 03/28/2018 CLINICAL DATA:  Elevated alkaline phosphatase level, ascites. EXAM: ULTRASOUND ABDOMEN LIMITED RIGHT UPPER QUADRANT COMPARISON:  None. FINDINGS: Gallbladder: The gallbladder is adequately distended. There is a 6 mm diameter non focal nonshadowing focus most compatible with a polyp. There is a coarse stone measuring 11 mm in diameter. There is gallbladder wall thickening to 7.5 mm. There are also echogenic foci within the gallbladder wall with comet tail artifact likely reflecting adenomyomatosis. There is no positive sonographic Murphy's sign. There is a moderate amount of ascites around the gallbladder. Common bile duct: Diameter: 4.8-7 mm.  No intraluminal stones or sludge are observed. Liver: The hepatic echotexture is heterogeneous. The surface contour is irregular. There is no focal mass nor ductal dilation. A moderate amount of ascites surrounds the liver. Portal vein is patent on color Doppler imaging with normal direction of blood flow towards the liver. IMPRESSION: Gallstones and polyps. Gallbladder wall thickening. No positive sonographic Murphy's sign. The findings may likely reflect chronic cholecystitis and cholelithiasis. Heterogeneous hepatic echotexture compatible with the pattern cellular disease, possibly cirrhosis. No suspicious hepatic masses.  Moderate amount of intra-abdominal ascites. Electronically Signed   By: David  [place] M.D.   On: 03/28/2018 10:49   Ir Paracentesis  Result Date: 04/05/2018 INDICATION: Patient with history of elevated alkaline phosphatase, possible cirrhosis, now with ascites. Request is made for diagnostic and therapeutic paracentesis. EXAM: ULTRASOUND GUIDED DIAGNOSTIC AND THERAPEUTIC PARACENTESIS MEDICATIONS: 10 mL 2% lidocaine COMPLICATIONS: None immediate. PROCEDURE: Informed written consent was obtained from the patient after a discussion of the risks, benefits and alternatives to treatment. A timeout was performed prior to the initiation of the procedure. Initial ultrasound scanning demonstrates a small amount of ascites within the right lower abdominal quadrant. The right lower abdomen was prepped and draped in the usual sterile fashion. 2% lidocaine was used for local anesthesia. Following this, a 19 gauge, 7-cm, Yueh catheter was introduced. An ultrasound image was saved for documentation purposes. The paracentesis was performed. The catheter was removed and a dressing was applied. The patient tolerated the procedure well without immediate post procedural complication. FINDINGS: A total of approximately 1.4 liters of clear, yellow fluid was removed. Samples were sent to the laboratory as requested by the clinical team. IMPRESSION: Successful ultrasound-guided diagnostic and therapeutic paracentesis yielding 1.4 liters of peritoneal fluid. Read by: Loyce Dys PA-C Electronically Signed   By: Judie Petit.  Shick M.D.   On: 04/05/2018 11:40     Assessment & Plan:   COPD (chronic obstructive pulmonary disease) (HCC) Pt has heavy smoking hx - PFT show  Moderate COPD , Mid flow reversibility .  Does have a severe Diffusing defect - ? Restrictive process with ascites /abd distenstion  Will check HRCT chest to make sure no ILD changes   Plan  Patient Instructions  Begin Symbicort 80 2 puffs Twice daily  , rinse after  use.  Begin CPAP At bedtime  .  Goal is to wear for at least 4hr each night .  POC evaluation as planned at DME .  Wear Oxygen 2l/m with activity .  Follow up in 4-6 weeks with Dr. Vassie Loll  And As needed   Please contact office for sooner follow up if symptoms do not improve or worsen or seek emergency care        OSA (  obstructive sleep apnea) Severe OSA on sleep study  Significant Card hx  Begin CPAP   Plan  Patient Instructions  Begin Symbicort 80 2 puffs Twice daily  , rinse after use.  Begin CPAP At bedtime  .  Goal is to wear for at least 4hr each night .  POC evaluation as planned at DME .  Wear Oxygen 2l/m with activity .  Follow up in 4-6 weeks with Dr. Vassie Loll  And As needed   Please contact office for sooner follow up if symptoms do not improve or worsen or seek emergency care        Chronic respiratory failure with hypoxia (HCC) POC order for O2 2ll/m with activity .      Rubye Oaks, NP 04/25/2018

## 2018-04-25 NOTE — Procedures (Signed)
  Patient Name: Rachel Oneill, Rachel Oneill Date: 04/22/2018   Gender: Female  D.O.B: [birth year]  Age (years): 61  Referring Provider: Cyril Mourning MD, ABSM  Height (inches): 64  Interpreting Physician: Cyril Mourning MD, ABSM  Weight (lbs): 140  RPSGT: Rolene Arbour  BMI: 24  MRN: [id number]  Neck Size: 14.50  <br> <br>  CLINICAL INFORMATION  Sleep Study Type: NPSG Indication for sleep study: snoring, somnolence, tiredness, CHF Epworth Sleepiness Score: 3 SLEEP STUDY TECHNIQUE  As per the AASM Manual for the Scoring of Sleep and Associated Events v2.3 (April 2016) with a hypopnea requiring 4% desaturations. The channels recorded and monitored were frontal, central and occipital EEG, electrooculogram (EOG), submentalis EMG (chin), nasal and oral airflow, thoracic and abdominal wall motion, anterior tibialis EMG, snore microphone, electrocardiogram, and pulse oximetry. MEDICATIONS  Medications self-administered by patient taken the night of the study : N/A SLEEP ARCHITECTURE  The study was initiated at 11:08:58 PM and ended at 5:24:40 AM. Sleep onset time was 114.8 minutes and the sleep efficiency was 46.7%%. The total sleep time was 175.4 minutes. Stage REM latency was 135.0 minutes. The patient spent 4.8%% of the night in stage N1 sleep, 71.5%% in stage N2 sleep, 0.0%% in stage N3 and 23.7% in REM. Alpha intrusion was absent. Supine sleep was 74.91%. RESPIRATORY PARAMETERS  The overall apnea/hypopnea index (AHI) was 34.2 per hour. There were 53 total apneas, including 51 obstructive, 2 central and 0 mixed apneas. There were 47 hypopneas and 1 RERAs. The AHI during Stage REM sleep was 37.6 per hour. AHI while supine was 45.2 per hour. The mean oxygen saturation was 90.2%. The minimum SpO2 during sleep was 81.0%. moderate snoring was noted during this study. CARDIAC DATA  The 2 lead EKG demonstrated sinus rhythm. The mean heart rate was 73.9 beats per minute. Other EKG findings include:  PVCs.  LEG MOVEMENT DATA  The total PLMS were 0 with a resulting PLMS index of 0.0. Associated arousal with leg movement index was 0.0 . IMPRESSIONS  - Moderate obstructive sleep apnea occurred during this study (AHI = 34.2/h). - No significant central sleep apnea occurred during this study (CAI = 0.7/h).  - Mild oxygen desaturation was noted during this study (Min O2 = 81.0%).  - The patient snored with moderate snoring volume.  - EKG findings include PVCs.  - Clinically significant periodic limb movements did not occur during sleep. No significant associated arousals. DIAGNOSIS  - Obstructive Sleep Apnea (327.23 [G47.33 ICD-10]) - Nocturnal Hypoxemia (327.26 [G47.36 ICD-10]) RECOMMENDATIONS  - Therapeutic CPAP titration to determine optimal pressure required to alleviate sleep disordered breathing.  - Positional therapy avoiding supine position during sleep.  - Avoid alcohol, sedatives and other CNS depressants that may worsen sleep apnea and disrupt normal sleep architecture.  - Sleep hygiene should be reviewed to assess factors that may improve sleep quality.  - Weight management and regular exercise should be initiated or continued if appropriate.   Cyril Mourning MD Board Certified in Sleep medicine

## 2018-04-25 NOTE — Assessment & Plan Note (Signed)
POC order for O2 2ll/m with activity .

## 2018-04-25 NOTE — Assessment & Plan Note (Signed)
Severe OSA on sleep study  Significant Card hx  Begin CPAP   Plan  Patient Instructions  Begin Symbicort 80 2 puffs Twice daily  , rinse after use.  Begin CPAP At bedtime  .  Goal is to wear for at least 4hr each night .  POC evaluation as planned at DME .  Wear Oxygen 2l/m with activity .  Follow up in 4-6 weeks with Dr. Vassie Loll  And As needed   Please contact office for sooner follow up if symptoms do not improve or worsen or seek emergency care

## 2018-04-25 NOTE — Patient Instructions (Addendum)
Begin Symbicort 80 2 puffs Twice daily  , rinse after use.  Begin CPAP At bedtime  .  Goal is to wear for at least 4hr each night .  POC evaluation as planned at DME .  Wear Oxygen 2l/m with activity .  Follow up in 4-6 weeks with Dr. Vassie Loll  And As needed   Please contact office for sooner follow up if symptoms do not improve or worsen or seek emergency care

## 2018-04-25 NOTE — Assessment & Plan Note (Signed)
Pt has heavy smoking hx - PFT show  Moderate COPD , Mid flow reversibility .  Does have a severe Diffusing defect - ? Restrictive process with ascites /abd distenstion  Will check HRCT chest to make sure no ILD changes   Plan  Patient Instructions  Begin Symbicort 80 2 puffs Twice daily  , rinse after use.  Begin CPAP At bedtime  .  Goal is to wear for at least 4hr each night .  POC evaluation as planned at DME .  Wear Oxygen 2l/m with activity .  Follow up in 4-6 weeks with Dr. Vassie Loll  And As needed   Please contact office for sooner follow up if symptoms do not improve or worsen or seek emergency care

## 2018-04-25 NOTE — Progress Notes (Signed)
PFT done today. 

## 2018-04-25 NOTE — Telephone Encounter (Signed)
Called and spoke with patient, she stated that she was at the doctors office and would call us back once she leaves.

## 2018-04-25 NOTE — Telephone Encounter (Signed)
Took her a long time to fall asleep Severe OAS 34/h recomm - CPAP titration study ideally or can try autoCPAP if she is willing

## 2018-04-26 ENCOUNTER — Encounter: Payer: Self-pay | Admitting: Endocrinology

## 2018-04-26 ENCOUNTER — Ambulatory Visit (INDEPENDENT_AMBULATORY_CARE_PROVIDER_SITE_OTHER): Payer: Medicare Other | Admitting: Endocrinology

## 2018-04-26 VITALS — BP 130/84 | HR 90 | Temp 97.4°F | Resp 18 | Ht 65.0 in | Wt 145.4 lb

## 2018-04-26 DIAGNOSIS — E1165 Type 2 diabetes mellitus with hyperglycemia: Secondary | ICD-10-CM

## 2018-04-26 DIAGNOSIS — G4733 Obstructive sleep apnea (adult) (pediatric): Secondary | ICD-10-CM | POA: Diagnosis not present

## 2018-04-26 DIAGNOSIS — E039 Hypothyroidism, unspecified: Secondary | ICD-10-CM

## 2018-04-26 MED ORDER — SITAGLIPTIN PHOSPHATE 50 MG PO TABS: 50.0000 mg | ORAL_TABLET | Freq: Every day | ORAL | 3 refills | Status: DC

## 2018-04-26 MED ORDER — LEVOTHYROXINE SODIUM 25 MCG PO TABS: 25.0000 ug | ORAL_TABLET | Freq: Every day | ORAL | 3 refills | Status: AC

## 2018-04-26 MED ORDER — LINAGLIPTIN 5 MG PO TABS: 5.0000 mg | ORAL_TABLET | Freq: Every day | ORAL | 1 refills | Status: DC

## 2018-04-26 MED ORDER — BUDESONIDE-FORMOTEROL FUMARATE 80-4.5 MCG/ACT IN AERO: 2.0000 | INHALATION_SPRAY | Freq: Two times a day (BID) | RESPIRATORY_TRACT | 0 refills | Status: DC

## 2018-04-26 NOTE — Addendum Note (Signed)
Addended by: Erlinda Hong on: 04/26/2018 09:46 AM   Modules accepted: Orders

## 2018-04-26 NOTE — Progress Notes (Signed)
Patient ID: Rachel Oneill, female   DOB: 04-May-1951, 67 y.o.   MRN: [id number]          Reason for Appointment: Endocrinology follow-up    Referring physician: Hetty Blend   History of Present Illness:          Date of diagnosis of type 2 diabetes mellitus:  2014      Background history:   She had been initially treated with metformin and this was continued until 2018 and stopped because of renal dysfunction She also has been treated at various times with Amaryl, glipizide Her follow-up with her PC has been erratic over the last few years A1c has been mostly 7 or above in 2017 and 2018  Recent history:   INSULIN regimen is:  Lantus insulin 5 units at bedtime   Non-insulin hypoglycemic drugs the patient is taking are: Januvia ?  50 mg daily  Current management, blood sugar patterns and problems identified:  Since she had inconsistent blood sugar control and persistently high A1c as well as probably high postprandial readings she was started on Tradjenta on her last visit  However this was not affordable for her and prescription for Januvia was then sent but not clear why this is not on her list  She not brought her monitor for download  Although she thinks her fasting readings are relatively higher it was only 102 in the lab letter done late morning  She does not think she has high readings after meals although not clear how often she checks them  Overall she thinks she is not going off her diet  However her weight is up 3 pounds, she also has had issues with fluid retention       Side effects from medications have been: None  Compliance with the medical regimen: Improving Hypoglycemia:   None   Glucose monitoring:  done about 1-2 times a day         Glucometer: ?.       Blood Glucose readings by recall:   PREMEAL Breakfast Lunch Dinner Bedtime  Overall   Glucose range: 124-146      Median:        POST-MEAL PC Breakfast PC Lunch PC Dinner  Glucose range:   160 150s  Median:      Self-care: The diet that the patient has been following is: tries to limit fried food       Typical meal intake: Breakfast is cereal/yogurt.               Dietician visit, most recent: 6/19               Ex[facility]: [facility] a [facility]    Weight history:  Wt Readings from Last 3 Encounters:  04/26/18 145 lb 6.4 oz (66 kg)  04/25/18 143 lb (64.9 kg)  04/22/18 140 lb (63.5 kg)    Glycemic control:   Lab Results  Component Value Date   HGBA1C 8.1 (H) 04/25/2018   HGBA1C 11.4% 12/17/2017   HGBA1C >14 12/03/2017   Lab Results  Component Value Date   MICROALBUR 27.5 08/06/2016   LDLCALC 75 03/23/2017   CREATININE 1.61 (H) 04/25/2018   Lab Results  Component Value Date   MICRALBCREAT 141 (H) 08/06/2016    Lab Results  Component Value Date   FRUCTOSAMINE 383 (H) 03/04/2018      Allergies as of 04/26/2018      Reactions  Shellfish Allergy Itching      Medication List        Accurate as of 04/26/18  9:12 PM. Always use your most recent med list.          aspirin 81 MG chewable tablet Chew 1 tablet (81 mg total) by mouth daily.   atorvastatin 80 MG tablet Commonly known as:  LIPITOR Take 1 tablet (80 mg total) by mouth daily.   BLOOD GLUCOSE TEST STRIPS Strp Test twice a day. Pt uses one touch ultra mini meter   budesonide-formoterol 80-4.5 MCG/ACT inhaler Commonly known as:  SYMBICORT Inhale 2 puffs into the lungs 2 (two) times daily.   ENTRESTO 97-103 MG Generic drug:  sacubitril-valsartan Take 1 tablet by mouth 2 (two) times daily.   Insulin Glargine 100 UNIT/ML Solostar Pen Commonly known as:  LANTUS Inject 10 Units into the skin daily at 10 pm.   Insulin Pen Needle 31G X 5 MM Misc As directed   KLOR-CON M20 20 MEQ tablet Generic drug:  potassium chloride SA TAKE ONE TABLET BY MOUTH WHEN YOU TAKE LASIX   levothyroxine 25 MCG tablet Commonly known as:  SYNTHROID,  LEVOTHROID Take 1 tablet (25 mcg total) by mouth daily before breakfast.   metolazone 5 MG tablet Commonly known as:  ZAROXOLYN TAKE 1 TABLET BY MOUTH ONCE DAILY IN THE MORNING FOR 5 DAYS. THEN STOP   metoprolol tartrate 25 MG tablet Commonly known as:  LOPRESSOR Take 25 mg by mouth daily.   sitaGLIPtin 50 MG tablet Commonly known as:  JANUVIA Take 1 tablet (50 mg total) by mouth daily.   torsemide 20 MG tablet Commonly known as:  DEMADEX Take 20 mg by mouth 2 (two) times daily.   Vitamin D (Cholecalciferol) 1000 units Tabs Take 2,000 Units by mouth every other day.       Allergies:  Allergies  Allergen Reactions  . Shellfish Allergy Itching    Past Medical History:  Diagnosis Date  . Abnormal ultrasound of liver 03/28/2018  . Bronchitis   . CHF (congestive heart failure) (HCC)   . Hypertension   . Pneumonia    2008ish  . Uncontrolled diabetes mellitus with complications (HCC)    Type II    Past Surgical History:  Procedure Laterality Date  . CARDIAC SURGERY     double bypass 2007  . CORONARY ARTERY BYPASS GRAFT    . ICD IMPLANT N/A 12/29/2016   Procedure: ICD Implant;  Surgeon: Regan Lemming, MD;  Location: [facility] INVASIVE CV LAB;  Service: Cardiovascular;  Laterality: N/A;  . IR PARACENTESIS  04/05/2018  . LIGATION OF ARTERIOVENOUS  FISTULA Left 12/08/2016   Procedure: LIGATION OF Left arm ARTERIOVENOUS  FISTULA;  Surgeon: Sherren Kerns, MD;  Location: [facility] OR;  Service: Vascular;  Laterality: Left;    Family History  Problem Relation Age of Onset  . Diabetes Mother   . Brain cancer Mother   . Cancer Father        Neck  . Colon cancer Neg Hx   . Stomach cancer Neg Hx     Social History:  reports that she quit smoking about 11 years ago. Her smoking use included cigarettes. She quit after 30.00 years of use. She has never used smokeless tobacco. She reports that she does not drink alcohol or use drugs.   Review of Systems   Lipid history: Has  been maintained on 80 mg Lipitor    Lab Results  Component Value Date  CHOL 127 03/23/2017   HDL 37 (L) 03/23/2017   LDLCALC 75 03/23/2017   TRIG 74 03/23/2017   CHOLHDL 3.4 03/23/2017           Hypertension: Not on any antihypertensive drugs  BP Readings from Last 3 Encounters:  04/26/18 130/84  04/25/18 124/66  04/01/18 110/66    Most recent eye exam was in 2017  Most recent foot exam:7/19  ? history of liver disease  Lab Results  Component Value Date   ALT 23 04/25/2018   Lab Results  Component Value Date   ALKPHOS 180 (H) 04/25/2018    HYPOTHYROIDISM and goiter:  On her initial exam she was felt to have a goiter, slightly nodular. Because of her history of taking amiodarone thyroid levels have been checked and TSH appears to be now higher than normal and gradually increasing She thinks she does have fatigue  Lab Results  Component Value Date   TSH 5.18 (H) 03/04/2018   TSH 3.38 08/06/2016   TSH 1.52 09/23/2015   FREET4 0.97 03/04/2018     LABS:  Clinical Support on 04/25/2018  Component Date Value Ref Range Status  . FVC-Pre 04/25/2018 1.83  L Preliminary  . FVC-%Pred-Pre 04/25/2018 70  % Preliminary  . FVC-Post 04/25/2018 1.83  L Preliminary  . FVC-%Pred-Post 04/25/2018 69  % Preliminary  . FVC-%Change-Post 04/25/2018 0  % Preliminary  . FEV1-Pre 04/25/2018 1.23  L Preliminary  . FEV1-%Pred-Pre 04/25/2018 60  % Preliminary  . FEV1-Post 04/25/2018 1.25  L Preliminary  . FEV1-%Pred-Post 04/25/2018 61  % Preliminary  . FEV1-%Change-Post 04/25/2018 1  % Preliminary  . FEV6-Pre 04/25/2018 1.82  L Preliminary  . FEV6-%Pred-Pre 04/25/2018 72  % Preliminary  . FEV6-Post 04/25/2018 1.83  L Preliminary  . FEV6-%Pred-Post 04/25/2018 72  % Preliminary  . FEV6-%Change-Post 04/25/2018 0  % Preliminary  . Pre FEV1/FVC ratio 04/25/2018 67  % Preliminary  . FEV1FVC-%Pred-Pre 04/25/2018 85  % Preliminary  . Post FEV1/FVC ratio 04/25/2018 68  % Preliminary  .  FEV1FVC-%Change-Post 04/25/2018 1  % Preliminary  . Pre FEV6/FVC Ratio 04/25/2018 99  % Preliminary  . FEV6FVC-%Pred-Pre 04/25/2018 103  % Preliminary  . Post FEV6/FVC ratio 04/25/2018 100  % Preliminary  . FEV6FVC-%Pred-Post 04/25/2018 104  % Preliminary  . FEV6FVC-%Change-Post 04/25/2018 0  % Preliminary  . FEF 25-75 Pre 04/25/2018 0.65  L/sec Preliminary  . FEF2575-%Pred-Pre 04/25/2018 34  % Preliminary  . FEF 25-75 Post 04/25/2018 0.78  L/sec Preliminary  . FEF2575-%Pred-Post 04/25/2018 41  % Preliminary  . FEF2575-%Change-Post 04/25/2018 20  % Preliminary  . RV 04/25/2018 2.00  L Preliminary  . RV % pred 04/25/2018 87  % Preliminary  . TLC 04/25/2018 3.85  L Preliminary  . TLC % pred 04/25/2018 71  % Preliminary  . DLCO unc 04/25/2018 4.71  ml/min/mmHg Preliminary  . DLCO unc % pred 04/25/2018 17  % Preliminary  . DLCO cor 04/25/2018 4.94  ml/min/mmHg Preliminary  . DLCO cor % pred 04/25/2018 18  % Preliminary  . DL/VA 16/05/9603 5.40  ml/min/mmHg/L Preliminary  . DL/VA % pred 98/06/9146 30  % Preliminary  Lab on 04/25/2018  Component Date Value Ref Range Status  . Sodium 04/25/2018 143  135 - 145 mEq/L Final  . Potassium 04/25/2018 3.5  3.5 - 5.1 mEq/L Final  . Chloride 04/25/2018 103  96 - 112 mEq/L Final  . CO2 04/25/2018 31  19 - 32 mEq/L Final  . Glucose, Bld 04/25/2018 102* 70 -  99 mg/dL Final  . BUN 16/05/9603 32* 6 - 23 mg/dL Final  . Creatinine, Ser 04/25/2018 1.61* 0.40 - 1.20 mg/dL Final  . Total Bilirubin 04/25/2018 1.4* 0.2 - 1.2 mg/dL Final  . Alkaline Phosphatase 04/25/2018 180* 39 - 117 U/L Final  . AST 04/25/2018 29  0 - 37 U/L Final  . ALT 04/25/2018 23  0 - 35 U/L Final  . Total Protein 04/25/2018 7.4  6.0 - 8.3 g/dL Final  . Albumin 54/04/8118 4.1  3.5 - 5.2 g/dL Final  . Calcium 14/78/2956 9.7  8.4 - 10.5 mg/dL Final  . GFR 21/30/8657 40.76* >60.00 mL/min Final  . Hgb A1c MFr Bld 04/25/2018 8.1* 4.6 - 6.5 % Final   Glycemic Control Guidelines for  People with Diabetes:Non Diabetic:  <6%Goal of Therapy: <7%Additional Action Suggested:  >8%     Physical Examination:  BP 130/84 (BP Location: Left Arm, Cuff Size: Normal)   Pulse 90   Temp (!) 97.4 F (36.3 C) (Oral)   Resp 18   Ht 5\' 5"  (1.651 m)   Wt 145 lb 6.4 oz (66 kg)   BMI 24.20 kg/m       ASSESSMENT:  Diabetes type 2, nonobese  See history of present illness for detailed discussion of current diabetes management, blood sugar patterns and problems identified Last A1c was 8.1  Currently on low-dose of basal insulin and has been prescribed Januvia but unclear whether she has been taking this or not She did not bring her monitor for download as directed also and not clear if she is having any hyperglycemia, lab glucose was 102 fasting   HYPOTHYROID: This is mild with TSH about 5 but not clear if she is symptomatic However in the presence of a goiter and history of using amiodarone with rising TSH it may be reasonable to give her a therapeutic trial of levothyroxine 25 mcg Discussed principles of thyroid supplementation and what levothyroxine is  PLAN:    She will check to see if she has her Januvia  Needs to bring her monitor for download on each visit  Needs to alternate fasting and postprandial readings  Consider consultation with dietitian  Patient Instructions  Check blood sugars on waking up  2/7  Also check blood sugars about 2 hours after a meal and do this after different meals by rotation  Recommended blood sugar levels on waking up is 80-130 and about 2 hours after meal is 130-160  Please bring your blood sugar monitor to each visit, thank you  Check on januvia       Reather Littler 04/26/2018, 9:12 PM   Note: This office note was prepared with Dragon voice recognition system technology. Any transcriptional errors that result from this process are unintentional.    Reather Littler

## 2018-04-26 NOTE — Addendum Note (Signed)
Addended by: Erlinda Hong on: 04/26/2018 10:16 AM   Modules accepted: Orders

## 2018-04-26 NOTE — Patient Instructions (Signed)
Check blood sugars on waking up  2/7  Also check blood sugars about 2 hours after a meal and do this after different meals by rotation  Recommended blood sugar levels on waking up is 80-130 and about 2 hours after meal is 130-160  Please bring your blood sugar monitor to each visit, thank you  Check on januvia

## 2018-04-26 NOTE — Progress Notes (Signed)
Patient seen in the 04/25/18 and instructed on use of Symbicort 80.  Patient expressed understanding and demonstrated technique.  Boone Master, [facility] 04/25/18

## 2018-05-01 DIAGNOSIS — I272 Pulmonary hypertension, unspecified: Secondary | ICD-10-CM | POA: Diagnosis present

## 2018-05-01 NOTE — H&P (Signed)
OFFICE VISIT NOTES COPIED TO EPIC FOR DOCUMENTATION  . History of Present Illness Rachel Maine FNP-C; 04/20/2018 9:51 PM) Patient words: f/u for acute systolic/diastolic hf; last OV 7/61/6073.  The patient is a 67 year old female who presents for a Follow-up for Cardiomyopathy.  Additional reasons for visit:  Follow-up for Coronary artery disease is described as the following: Aryam Zhan is an AAF with extensive cardiac and vascular history with bilateral iliac artery stenting in remote past, ischemic cardiomyopathy S/P CABG and MV repair in 2007, with severe LV dysfunction S/P Medtronic Visia AF MRI Single chamber device 12/30/2016. She has stenting to RCA & circumflex coronary artery in 2006 and in 2004. Presented with NSTEMI on 06/24/2016 in [place], S/P LAD extraction thrombectomy and had a staged PCI to proximal circumflex coronary artery bypass graft with placement of drug-eluting stent. Other history included prior tobacco use, essential hypertension, stage 4 CKD and a small abdominal aortic aneurysm noted in 2017.  Echocardiogram on 03/03/2018 revealed decrease in EF to 15%, RV strain, and severe pulmonary hypertension with PA pressure of 69mhg. Entresto was decreased due to CKD. She now presents for 1 month follow up.  Due to ascites and abnormal liver enzymes, underwent paracentesis on 04/05/2018 with 1.4 L of fluid removed. Findings suggest underlying primary biliary cholangitis given elevated AMA. She is also now using oxygen therapy at night and periodically during the day. Overall states that she is feeling slightly better. Continues to have dyspnea that has been stable and leg edema depending on her diet. She is trying to be compliant with her diet; however, does admit to occasionally not being strict with this. She is tolerating medications well. She has been noted to nodular thyroid that is being evaluated by Dr. KDwyane Dee   Problem List/Past Medical (Frances FurbishJohnson;  04/19/2018 2:59 PM) Controlled diabetes mellitus type II without complication (EX10.6  Gout (M10.9)  History of MI (myocardial infarction) (I25.2)  Atherosclerosis of native coronary artery of native heart without angina pectoris (I25.10)  Coronary angiogram 06/24/2016: PTCA and stenting of the mid LAD with implantation of a 3.0 x 18 mm Onyx DES following thrombectomy. 06/26/2016: 2.5 x 15 mm Onyx DES in [facility] [place] Mid RCA CTO. SVG to RCA patent. SVG to distal circumflex patent. LVEF 30%. H/O MV ring placement (05/12/06). Coronary angiogram 05/09/2007: LVEF 35-40% with no significant MR. SVG to OM1 and SVG to PDA are patent. Her native circumflex stent is patent. H/O MV ring placement (CABG 05/12/2006). Patent Cx stent on 07/29/2005 and also 2004. Right coronary artery stent placed on 01/30/2005. PAD (peripheral artery disease) (I73.9)  Lower extremity arterial duplex 11/19/2015: No hemodynamically significant stenoses are identified in the right lower extremity arterial system. Significant velocity increase >50% stenosis at the left distal superficial femoral artery. This exam reveals moderately decreased perfusion of the lower extremity bilaterally with RABI of 0.72 and LABI of 0.65. Peripheral arteriogram 07/11/2007: Right common iliac high-grade 90% stenosis at bifurcation of internal iliac. Left common iliac artery moderate stenosis with a 50 mmHg pressure gradient. 95% stenosis left distal SFA, 50% stenosis right made SFA three-vessel runoff on the right and two-vessel runoff in the left with occluded left anterior tibial. 02/07/2008: Stenting of the right iliac artery with 10.0 x 40 mm Proteg self-expanding stent. Stenting of the left common iliac artery and external iliac artery with 10.0 x 30 mm Proteg self-expanding stent. 80%  to 0%. S/P CABG x 2 (Z95.1)  05/12/2006: SVG to PDA and SVG to OM1 and MV ring  placement Benign essential hypertension (I10)  Dyspnea on exertion (R06.09)  AAA (abdominal aortic aneurysm) without rupture (I71.4)  Abdominal aortic duplex 11/19/2015: Moderate dilatation of the abdominal aorta is noted in the mid aorta. An abdominal aortic aneurysm measuring 3.63 x 3.56 x 3.62 cm is seen. Diffuse plaque noted in the aorta. Recheck in 1 year. Bilateral iliac stents appear patent. <50% stenosis left iliac stent. Diabetes mellitus type 2 with peripheral artery disease (E11.51)  Laboratory examination (Z01.89)  03/04/2018: TSH elevated at 5.18, T4 normal. Labs 12/29/2017: Glucose 50, creatinine 2.01, EGFR 29, potassium 4.3, alkaline phos 175, CMP otherwise normal. 11/01/2017: Glucose 424, creatinine 1.6, EGFR 36, potassium 3.4, BMP otherwise normal. BNP 4136.6 12/25/2016: BNP 2756.8. Labs 12/15/2016: Serum glucose 160 mg, nonfasting. BUN 27, creatinine 1.52, eGFR 41 mL, potassium 3.6. BNP 3684. 11/03/2016: Hemoglobin A1c 7.4%, vitamin D 35. Potassium slightly low at 3.4, creatinine 1.48, BUN 24, CMP normal. 02/13/2016: HbA1c 7.7%, Glucose 130, creatinine 1.08, potassium 4.1 01/31/2016: Glucose 214, creatinine 1.17, EGFR 55, potassium 3.3, HV 10.6/HCT 32.4 with normocytic indices, BNP 2232.7 01/14/2016: Creatinine 1.1, eGFR 60, potassium 3.2 12/11/2015: Glucose 159, creatinine 1.14, eGFR 58, potassium 3.7 11/12/2015: Creatinine 1.11, potassium 4.3, total cholesterol 111, triglycerides 09, HDL 33, LDL 56 Hypercholesteremia (E78.00)  Ischemic dilated cardiomyopathy (I25.5, I42.0)  Echocardiogram 03/03/2018: Left ventricle cavity is normal in size. Mild concentric hypertrophy of the left ventricle. Inferior wall appears thinned out. Indeterminate diastolic filling pattern due to MV repair status. There is severe global hypokinesis. Basal inferior, Mid inferior and Apical inferior akinesis. Basal inferoseptal and Mid inferoseptal dyskinesis. Calculated EF 15%. Left atrial cavity is moderately dilated at  4.7 cm. Right atrial cavity is moderately dilated. Right ventricle cavity is moderately dilated. Moderately reduced right ventricular function. Pacemaker lead/ICD lead noted in the RV. Mitral valve repair with mild (Grade I) regurgitation. Mildly thickened valve leaflets with mild restrition. No mitral stenosis. Moderate to severe tricuspid regurgitation. Severe pulmonary hypertension. Estimated pulmonary artery systolic pressure 72 mmHg (CVP 15). Moderate pulmonic regurgitation. IVC is dilated with poor inspiration collapse consistent with elevated right atrial pressure. Compared to the study done on 02/23/2017, EF is decreased from 25-30% to the present 15%. Right ventricle enlargement is new. Previously mild pulmonary hypertension. Presence of single chamber implantable cardioverter-defibrillator (ICD) (Z95.810)  Medtronic Visia AF MRI Single chamber device 12/30/2016 by Crissie Sickles. Unscheduled transmission 04/12/2018: There were 0 VF episodes and 0 nonsustained episodes of ventricular tachycardia. Health trends (patient activity, heart rate variability, average heart rates) are stable. Frequent high ventricular rate detected is false increase in rate due to PACs. No A. Fibrillation. Trans-thoracic impedance trends and the Optivol Fluid Index is suggestive of fluid accumulation that is ongoing. Battery longevity is 10.9 years (12-Apr-2018) . RV pacing is 0.4 %. Scheduled Remote ICD transmission 02/07/2018: Normal function, no atrial fibrillation, frequent PVC and PAC. No therapy, no CHF. Life >10 years. Not paced. Scheduled Impedance monitoring for CHF via remote pacemaker/ICD 10/25/2017: Normal ICD function, trending towards CHF, will inform patient.Theora Master Inperson ICD check 06/03/2017: Normal ICD function. No A. Fib. False increased rate and A. Fib alert due to PACs. No CHF. Normal thresholds. No changes made today. NSVT 05/31/17 5 beat VT. No therapy. Acute on chronic combined systolic and diastolic CHF  (congestive heart failure) (I50.43)  Encounter for fitting or adjustment of implantable cardioverter-defibrillator (ICD) (Z45.02)  Atrial tachycardia (  I47.1)  PVC (premature ventricular contraction) (I49.3)  EKG 11/01/2017: Normal sinus rhythm at 73 bpm with first degree AV block, frequent multifocal PVCs, left atrial abnormality, normal axis, diffuse nonspecific T-wave flattening. QT stable. No significant change compared to EKG in January 2019. S/P MVR (mitral valve repair) (X09.407)  05/12/2006: Mitral valve repair with [facility] model 4100 annuloplasty ring, size 26 mm, serial number [id number]. CKD stage 4 secondary to hypertension (I12.9)  Enrolled in clinical trial of drug (Z00.6) [04/10/2016]: She is enrolled in "GALACTIC Heart Failure Trial" (Omecaltiv Mecarbil). Liver cirrhosis (K74.60)   Allergies Frances Furbish Johnson; 2018/04/23 2:46 PM) Shellfish  Itching. Atorvastatin Calcium *ANTIHYPERLIPIDEMICS*  itching Amiodarone HCl *ANTIARRHYTHMICS*  Itching.  Family History Cheri Kearns; 04/23/18 2:46 PM) Mother  Deceased. in her 69's from [place]; No known Heart conditions Father  Deceased. in his 36's from Neck Cancer; No known Heart conditions Sister 4  All Younger; No known Heart conditions Siblings  6-Brothers; No known Heart conditions  Social History Cheri Kearns; April 23, 2018 2:46 PM) Current tobacco use  Former smoker. Quit 2007 Non Drinker/No Alcohol Use  Living Situation  Lives alone. Marital status  Divorced. Number of Children  5.  Past Surgical History Frances Furbish Wynetta Emery; 04-23-2018 2:46 PM) Coronary Artery Bypass, Two [05/12/2006]: SVG to PDA and SVG to OM1 Replace Mitral Valve, w/Bypass [05/12/2006]: Mitral valve repair with [facility] model 4100 annuloplasty ring, size 26 mm, serial number [id number].  Medication History Rachel Maine, FNP-C; 04/20/2018 9:42 PM) Albuterol (90MCG/ACT Aerosol Soln, Inhalation as needed)  Active. Lantus (100UNIT/ML Solution, 5units Subcutaneous bedtime) Active. Amiodarone HCl (200MG Tablet, 1 Tablet Tablet Tablet Tablet Oral daily, Taken starting 08/31/2017) Active. Entresto (97-103MG Tablet, 1/2 Oral two times daily, Taken starting 02/21/2018) Active. (Decrease dose) Metoprolol Tartrate (25MG Tablet, 1 Tablet Tablet Tablet Tablet Oral daily, Taken starting 11/03/2017) Active. (03/08/2017: She is taking one tab per day, will increase gradually to twice daily) Aspirin Child (81MG Tablet Chewable, 1 Oral daily) Active. Vitamin D (Cholecalciferol) (400UNIT Capsule, 2 Oral daily) Active. Atorvastatin Calcium (80MG Tablet, 1 Tablet Tablet Tablet Tab Oral bedtime, Taken starting 12/30/2017) Active. Klor-Con M20 [facility] Tablet ER, 1/2 tablet Oral daily with Torsemide, Taken starting 07/14/2017) Active. Torsemide (20MG Tablet, 2 Oral every morning, Taken starting 07/14/2017) Active. Medications Reconciled (meds present)  Diagnostic Studies History Frances Furbish Wynetta Emery; Apr 23, 2018 2:48 PM) Coronary Angiogram [06/24/2016]: Coronary angiogram 06/24/2016: PTCA and stenting of the mid LAD with implantation of a 3.0 x 18 mm Onyx DES following thrombectomy. 06/26/2016: 2.5 x 15 mm Onyx DES in the proximal left circumflex performed at our [facility] in [place] for NSTEMI. Mid RCA CTO. SVG to RCA patent. SVG to distal circumflex patent. LVEF 30%. H/O MV ring placement (05/12/06). Lower Extremity Dopplers [11/19/2015]: No hemodynamically significant stenoses are identified in the right lower extremity arterial system. Significant velocity increase >50% stenosis at the left distal superficial femoral artery. This exam reveals moderately decreased perfusion of the lower extremity bilaterally with RABI of 0.72 and LABI of 0.65. Abdominal Aortogram [07/11/2007]: High-grade 90% stenosis of the right common iliac and internal iliac artery stenosis at the bifurcation. Moderate  stenosis of the left common iliac artery with a 15 mm pressure gradient. A 95% stenosis of the left distal superficial femoral artery and a 50% stenosis of the right mid superficial femoral artery. Three-vessel runoff on the right and 2-vessel runoff on the left with occluded leftanterior tibial artery below the knee. Nuclear stress test [11/18/2015]: 1. Resting  EKG demonstrates normal sinus, normal axis. No evidence of ischemia stress EKG negative for myocardial ischemia. Patient exercised for 4:02 minutes and achieved 4.84 METS. Stress test terminated due to fatigue, achieved 92% of MPHR. Normal blood pressure response. Occasional PACs. 2. The left ventricle was dilated moderately in both rest and stress images, LV end-diastolic volume of 269 mL. The perfusion imaging study demonstrates a large sized scar involving the entire lateral wall from the base towards the apex. There is no significant peri-infarct ischemia. Left ventricular systolic function was markedly depressed at 16%. This is a high risk study, clinical correlation is recommended. Abdominal Ultrasound [11/19/2015]: Moderate dilatation of the abdominal aorta is noted in the mid aorta. An abdominal aortic aneurysm measuring 3.63 x 3.56 x 3.62 cm is seen. Diffuse plaque noted in the aorta. Recheck in 1 year. Bilateral iliac stents appear patent. <50% stenosis left iliac stent. TEE [05/11/2006]: 1. Mitral valve. There was an annuloplasty ring noted in the mitral position. The posterior leaflet was fixed and the anterior leaflet showed the characteristic trap door appearance with an annuloplasty ring. There was residual trace to 1+ mitral insufficiency noted. 2. Aortic valve. The aortic valve was unchanged from the pre-bypass study. The leaflets open normally without aortic insufficiency. 3. Left ventricle. Left ventricular function showed initially severe hypokinesis of the inferior wall and inferior septum which appear to improve over time.  Ejection fraction was estimated at 45-50%. 4. Right ventricle. The right ventricular size again appeared to be within normal limits and there was good contractility to the right ventricular free wall. CT Scan of Chest [04/06/2009]: 1. No pulmonary emboli. 2. Acute CHF. 3. Interval multiple bilateral noncalcified lung nodules. These could represent lung metastases or noncalcified granulomata. 4. Stable mediastinal and bilateral hilar adenopathy. 5. Cardiomegaly. Chest X-ray [02/07/2016]: fluid on lungs Echocardiogram  03/03/2018: Left ventricle cavity is normal in size. Mild concentric hypertrophy of the left ventricle. Inferior wall appears thinned out. Indeterminate diastolic filling pattern due to MV repair status. There is severe global hypokinesis. Basal inferior, Mid inferior and Apical inferior akinesis. Basal inferoseptal and Mid inferoseptal dyskinesis. Calculated EF 15%. Left atrial cavity is moderately dilated at 4.7 cm. Right atrial cavity is moderately dilated. Right ventricle cavity is moderately dilated. Moderately reduced right ventricular function. Pacemaker lead/ICD lead noted in the RV. Mitral valve repair with mild (Grade I) regurgitation. Mildly thickened valve leaflets with mild restrition. No mitral stenosis. Moderate to severe tricuspid regurgitation. Severe pulmonary hypertension. Estimated pulmonary artery systolic pressure 72 mmHg (CVP 15). Moderate pulmonic regurgitation. IVC is dilated with poor inspiration collapse consistent with elevated right atrial pressure. Compared to the study done on 02/23/2017, EF is decreased from 25-30% to the present 15%. Right ventricle enlargement is new. Previously mild pulmonary hypertension.    Review of Systems Laverda Page, MD; 04/20/2018 8:16 AM) General Present- Fatigue (occasional). Not Present- Anorexia and Fever. Respiratory Present- Difficulty Breathing (stable). Not Present- Cough. Cardiovascular Present-  Claudications (weakness in legs and generalized weakness with activity, mild and stable, left leg worse) and Edema. Not Present- Chest Pain, Fainting, Orthopnea, Palpitations and Swelling of Extremities. Gastrointestinal Not Present- Black, Tarry Stool, Change in Bowel Habits and Nausea. Neurological Not Present- Focal Neurological Symptoms and Syncope. Endocrine Not Present- Cold Intolerance, Excessive Sweating, Heat Intolerance and Thyroid Problems. Hematology Not Present- Anemia, Easy Bruising, Petechiae and Prolonged Bleeding. All other systems negative  Vitals Frances Furbish Johnson; 04/19/2018 2:58 PM) 04/19/2018 2:49 PM Weight: 142.13 lb Height: 64in Body Surface Area: 1.69  m Body Mass Index: 24.4 kg/m  Pulse: 70 (Regular)  P.OX: 91% (Room air) BP: 143/89 (Sitting, Left Arm, Standard)       Physical Exam Laverda Page, MD; 04/20/2018 8:16 AM) General Mental Status-Alert. General Appearance-Cooperative and Appears older than stated age. Build & Nutrition-Short statured and [place].  Head and Neck Thyroid Gland Characteristics - normal size and consistency and no palpable nodules.  Chest and Lung Exam Chest and lung exam reveals -quiet, even and easy respiratory effort with no use of accessory muscles, non-tender and on auscultation, normal breath sounds, no adventitious sounds. Inspection Chest Wall - Scar - Pacemaker pocket scar.  Cardiovascular Auscultation Rhythm - Frequent Ectopy. Heart Sounds - Normal heart sounds. Murmurs & Other Heart Sounds: Murmur: Murmur 2 - Location - Aortic Area. Timing - Early systolic. Grade - II/VI. Location - Apex. Timing - Mid-diastolic. Grade - III/VI.  Abdomen Inspection Contour - Generalized mild distention. Umbilicus - Even with abdominal wall. Palpation/Percussion Normal exam - Non Tender and No hepatosplenomegaly. Dullness to percussion - Generalized(shifting dullness present).  Peripheral Vascular Lower  Extremity Inspection - Bilateral - Loss of hair and Shiny atrophic skin. Palpation - Temperature - Bilateral - Normal. Edema - Bilateral - 2+ Pitting edema. Femoral pulse - Left - 1+(bruit). Right - Normal. Popliteal pulse - Bilateral - Feeble. Dorsalis pedis pulse - Bilateral - Absent. Posterior tibial pulse - Bilateral - Absent. Lower Extremity Palpation - Brachial pulse - Left - No Bruits, No Thrills. Note: newly noted AV fistula in L AC has now been surgically corrected. Incision healing well. Auscultation - Note: left brachial artery bruit- now surgically resolved. Carotid arteries - Bilateral-No Carotid bruit. Abdomen-No prominent abdominal aortic pulsation.  Neurologic Neurologic evaluation reveals -alert and oriented x 3 with no impairment of recent or remote memory. Motor-Grossly intact without any focal deficits.  Musculoskeletal Global Assessment Left Lower Extremity - no deformities, masses or tenderness, no known fractures. Right Lower Extremity - no deformities, masses or tenderness, no known fractures.  Assessment & Plan Rachel Maine FNP-C; 04/20/2018 9:42 PM) Acute on chronic combined systolic and diastolic CHF (congestive heart failure) (I50.43) Current Plans Started Aldactone 50MG, 1 (one) Tablet daily in the morning, #30, 30 days starting 04/19/2018, Ref. x1. Atherosclerosis of native coronary artery of native heart without angina pectoris (I25.10) Story: Coronary angiogram 06/24/2016: PTCA and stenting of the mid LAD with implantation of a 3.0 x 18 mm Onyx DES following thrombectomy. 06/26/2016: 2.5 x 15 mm Onyx DES in the proximal left circumflex performed at our [facility] in [place] for NSTEMI. Mid RCA CTO. SVG to RCA patent. SVG to distal circumflex patent. LVEF 30%. H/O MV ring placement (05/12/06).  Coronary angiogram 05/09/2007: LVEF 35-40% with no significant MR. SVG to OM1 and SVG to PDA are patent. Her native circumflex stent is  patent. H/O MV ring placement (CABG 05/12/2006). Patent Cx stent on 07/29/2005 and also 2004. Right coronary artery stent placed on 01/30/2005. Impression: EKG 04/19/2018: Normal sinus rhythm at rate of 67 bpm, normal axis, posterior infarct old versus RVH. Nonspecific T abnormality. PAC. Compared to EKG 07/09/2016 Inferior and lateral T wave inversion, not present. 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 x2 (decrease, start Aldactone). Local Order: Decrease dose Presence of single chamber implantable cardioverter-defibrillator (ICD) (Z95.810) Story: Medtronic Visia  AF MRI Single chamber device 12/30/2016 by Crissie Sickles.  Unscheduled transmission 04/12/2018: There were 0 VF episodes and 0 nonsustained episodes of ventricular tachycardia. Health trends (patient activity, heart rate variability, average heart rates) are stable. Frequent high ventricular rate detected is false increase in rate due to PACs. No A. Fibrillation. Trans-thoracic impedance trends and the Optivol Fluid Index is suggestive of fluid accumulation that is ongoing. Battery longevity is 10.9 years (12-Apr-2018) . RV pacing is 0.4 %.  Scheduled Remote ICD transmission 02/07/2018: Normal function, no atrial fibrillation, frequent PVC and PAC. No therapy, no CHF. Life >10 years. Not paced.  Scheduled Impedance monitoring for CHF via remote pacemaker/ICD 10/25/2017: Normal ICD function, trending towards CHF, will inform patient.Theora Master Inperson ICD check 06/03/2017: Normal ICD function. No A. Fib. False increased rate and A. Fib alert due to PACs. No CHF. Normal thresholds. No changes made today. NSVT 05/31/17 5 beat VT. No therapy. Impression: 04/05/2017: EP consult with Dr. Allegra Lai: She has had several hours on her  device that have more atrial fibrillation. Unclear if this is due to atrial fibrillation or simply atrial ectopy. That being said some of her episodes have lasted 3 or so hours. Would have a low threshold for anticoagulation as you continue to follow her in the future. Enrolled in clinical trial of drug (Z00.6) Story: She is enrolled in "GALACTIC Heart Failure Trial" (Omecaltiv Mecarbil). 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  04/01/2018: Potassium 3.1, creatinine 1.4, total bilirubin 1.7, alkaline false 169 CMP otherwise normal. RBC 3.49, MCV 105.6, CBC otherwise normal.   03/04/2018: TSH elevated at 5.18, T4 normal.  Labs 12/29/2017: Glucose 50, creatinine 2.01, EGFR 29, potassium 4.3, alkaline phos 175, CMP otherwise normal.  11/01/2017: Glucose 424, creatinine 1.6, EGFR 36, potassium 3.4, BMP otherwise normal. BNP 4136.6  12/25/2016: BNP 2756.8.  Labs 12/15/2016: Serum glucose 160 mg, nonfasting. BUN 27, creatinine 1.52, eGFR 41 mL, potassium 3.6. BNP 3684.  11/03/2016: Hemoglobin A1c 7.4%, vitamin D 35. Potassium slightly low at 3.4, creatinine 1.48, BUN 24, CMP normal.  02/13/2016: HbA1c 7.7%, Glucose 130, creatinine 1.08, potassium 4.1  01/31/2016: Glucose 214, creatinine 1.17, EGFR 55, potassium 3.3, HV 10.6/HCT 32.4 with normocytic indices, BNP 2232.7  01/14/2016: Creatinine 1.1, eGFR 60, potassium 3.2  12/11/2015: Glucose 159, creatinine 1.14, eGFR 58, potassium 3.7  11/12/2015: Creatinine 1.11, potassium 4.3, total cholesterol 111, triglycerides 09, HDL 33, LDL 56  Note:-  Recommendations:  Jadeyn Hargett is an AAF with extensive cardiac and vascular history with bilateral iliac artery stenting in remote past, ischemic cardiomyopathy S/P CABG and MV  repair in 2007, with severe LV dysfunction S/P Medtronic Visia AF MRI Single chamber device 12/30/2016. She has stenting to RCA & circumflex coronary artery in 2006 and in 2004. Presented with NSTEMI on 06/24/2016 in [place], S/P LAD extraction thrombectomy and had a staged PCI to proximal circumflex coronary artery bypass graft with placement of drug-eluting stent. Other history included prior tobacco use, essential hypertension, stage 4 CKD and a small abdominal aortic aneurysm noted in 2017.  She is enrolled in "GALACTIC Heart Failure Trial" (Omecaltiv Mecarbil). She also has known peripheral arterial disease with history of bilateral iliac artery stenting  She was seen about 2 months ago, in the interim now has the diagnosis of primary biliary cirrhosis (elevated AMA levels by  serology) and ascites, and is being followed by Dr. Ardis Hughs from GI standpoint. Has stage 4 CKD being followed by Dr. Carolin Sicks and is to see him next week.  I'm extremely concerned about her long-term prognosis. I suspect she probably has primary pulmonary hypertension as well, recent echocardiogram in August 2019 had revealed severe PH. Due to renal dysfunction, ascites being predominant, which may be contributed both by cirrhosis and CHF, I'll reduce the dose of Entresto from 49/51 mg to 24/26 mg and added Aldactone. We will obtain BMP in 2-3 weeks. Will continue with Torsemide and low dose potassium supplement for now as her potassium level has been low at 3.1. Will monitor. As she appears to be relatively well compensated with CHF, would recommend setting her up for right heart catheterization to evaluate pulmonary hypertension. I will discuss with Dr. Ardis Hughs. Suspect she will have very poor outcome very soon. Office visit after the procedure.  She has been noted to have nodular thyroid that is being evaluated by Dr. Dwyane Dee. She is currently on Amiodarone, but if needed can consider discontinuing.  *I have discussed  this case with Dr. Einar Gip and he personally examined the patient and participated in formulating the plan.*  CC: Harland Dingwall, NP-C (PCP); CC: Lawson Radar, MD (Nephro). Owens Loffler, MD (GI). Elayne Snare, MD (Endo). Kara Mead, MD (Pulm)    Signed by Rachel Maine, FNP-C (04/20/2018 9:54 PM)

## 2018-05-03 ENCOUNTER — Ambulatory Visit (HOSPITAL_COMMUNITY)
Admission: RE | Disposition: A | Discharge status: Home / Self Care | Payer: Self-pay | Source: Ambulatory Visit | Attending: Cardiology

## 2018-05-03 ENCOUNTER — Other Ambulatory Visit: Payer: Self-pay

## 2018-05-03 ENCOUNTER — Encounter (HOSPITAL_COMMUNITY): Payer: Self-pay | Admitting: Cardiology

## 2018-05-03 ENCOUNTER — Ambulatory Visit (HOSPITAL_COMMUNITY)
Admission: RE | Admit: 2018-05-03 | Discharge: 2018-05-03 | Disposition: A | Discharge status: Home / Self Care | Payer: Medicare Other | Source: Ambulatory Visit | Attending: Cardiology | Admitting: Cardiology

## 2018-05-03 DIAGNOSIS — E1151 Type 2 diabetes mellitus with diabetic peripheral angiopathy without gangrene: Secondary | ICD-10-CM | POA: Insufficient documentation

## 2018-05-03 DIAGNOSIS — I2510 Atherosclerotic heart disease of native coronary artery without angina pectoris: Secondary | ICD-10-CM | POA: Diagnosis not present

## 2018-05-03 DIAGNOSIS — E1122 Type 2 diabetes mellitus with diabetic chronic kidney disease: Secondary | ICD-10-CM | POA: Insufficient documentation

## 2018-05-03 DIAGNOSIS — Z794 Long term (current) use of insulin: Secondary | ICD-10-CM | POA: Insufficient documentation

## 2018-05-03 DIAGNOSIS — Z950 Presence of cardiac pacemaker: Secondary | ICD-10-CM | POA: Diagnosis not present

## 2018-05-03 DIAGNOSIS — Z951 Presence of aortocoronary bypass graft: Secondary | ICD-10-CM | POA: Diagnosis not present

## 2018-05-03 DIAGNOSIS — I5043 Acute on chronic combined systolic (congestive) and diastolic (congestive) heart failure: Secondary | ICD-10-CM | POA: Diagnosis not present

## 2018-05-03 DIAGNOSIS — I255 Ischemic cardiomyopathy: Secondary | ICD-10-CM | POA: Diagnosis not present

## 2018-05-03 DIAGNOSIS — I420 Dilated cardiomyopathy: Secondary | ICD-10-CM | POA: Insufficient documentation

## 2018-05-03 DIAGNOSIS — I272 Pulmonary hypertension, unspecified: Secondary | ICD-10-CM | POA: Insufficient documentation

## 2018-05-03 DIAGNOSIS — Z955 Presence of coronary angioplasty implant and graft: Secondary | ICD-10-CM | POA: Diagnosis not present

## 2018-05-03 DIAGNOSIS — I252 Old myocardial infarction: Secondary | ICD-10-CM | POA: Insufficient documentation

## 2018-05-03 DIAGNOSIS — M109 Gout, unspecified: Secondary | ICD-10-CM | POA: Diagnosis not present

## 2018-05-03 DIAGNOSIS — I714 Abdominal aortic aneurysm, without rupture: Secondary | ICD-10-CM | POA: Insufficient documentation

## 2018-05-03 DIAGNOSIS — Z87891 Personal history of nicotine dependence: Secondary | ICD-10-CM | POA: Insufficient documentation

## 2018-05-03 DIAGNOSIS — E7800 Pure hypercholesterolemia, unspecified: Secondary | ICD-10-CM | POA: Diagnosis not present

## 2018-05-03 DIAGNOSIS — R0602 Shortness of breath: Secondary | ICD-10-CM | POA: Diagnosis not present

## 2018-05-03 DIAGNOSIS — I130 Hypertensive heart and chronic kidney disease with heart failure and stage 1 through stage 4 chronic kidney disease, or unspecified chronic kidney disease: Secondary | ICD-10-CM | POA: Insufficient documentation

## 2018-05-03 DIAGNOSIS — K743 Primary biliary cirrhosis: Secondary | ICD-10-CM | POA: Diagnosis not present

## 2018-05-03 DIAGNOSIS — N184 Chronic kidney disease, stage 4 (severe): Secondary | ICD-10-CM | POA: Insufficient documentation

## 2018-05-03 HISTORY — PX: RIGHT HEART CATH: CATH118263

## 2018-05-03 LAB — POCT I-STAT 3, VENOUS BLOOD GAS (G3P V)
Acid-Base Excess: 3 mmol/L — ABNORMAL HIGH (ref 0.0–2.0)
Acid-Base Excess: 1 mmol/L (ref 0.0–2.0)
BICARBONATE: 26.6 mmol/L (ref 20.0–28.0)
Bicarbonate: 24.9 mmol/L (ref 20.0–28.0)
O2 SAT: 41 %
O2 Saturation: 40 %
PCO2 VEN: 36.3 mmHg — AB (ref 44.0–60.0)
PCO2 VEN: 38.4 mmHg — AB (ref 44.0–60.0)
PH VEN: 7.449 — AB (ref 7.250–7.430)
PO2 VEN: 22 mmHg — AB (ref 32.0–45.0)
PO2 VEN: 22 mmHg — AB (ref 32.0–45.0)
TCO2: 26 mmol/L (ref 22–32)
TCO2: 28 mmol/L (ref 22–32)
pH, Ven: 7.444 — ABNORMAL HIGH (ref 7.250–7.430)

## 2018-05-03 LAB — GLUCOSE, CAPILLARY: GLUCOSE-CAPILLARY: 131 mg/dL — AB (ref 70–99)

## 2018-05-03 SURGERY — RIGHT HEART CATH

## 2018-05-03 MED ORDER — SODIUM CHLORIDE 0.9% FLUSH: 3.0000 mL | INTRAVENOUS | Status: DC | PRN

## 2018-05-03 MED ORDER — HEPARIN (PORCINE) IN NACL 1000-0.9 UT/500ML-% IV SOLN
INTRAVENOUS | Status: AC
  Filled 2018-05-03: qty 500

## 2018-05-03 MED ORDER — LIDOCAINE HCL (PF) 1 % IJ SOLN
INTRAMUSCULAR | Status: AC
  Filled 2018-05-03: qty 30

## 2018-05-03 MED ORDER — SODIUM CHLORIDE 0.9 % IV SOLN
INTRAVENOUS | Status: DC
  Administered 2018-05-03: 10:00:00 via INTRAVENOUS

## 2018-05-03 MED ORDER — SODIUM CHLORIDE 0.9% FLUSH: 3.0000 mL | Freq: Two times a day (BID) | INTRAVENOUS | Status: DC

## 2018-05-03 MED ORDER — SODIUM CHLORIDE 0.9 % IV SOLN: 250.0000 mL | INTRAVENOUS | Status: DC | PRN

## 2018-05-03 MED ORDER — LIDOCAINE HCL (PF) 1 % IJ SOLN
INTRAMUSCULAR | Status: DC | PRN
  Administered 2018-05-03: 2 mL

## 2018-05-03 MED ORDER — ASPIRIN 81 MG PO CHEW
CHEWABLE_TABLET | ORAL | Status: AC
  Administered 2018-05-03: 81 mg
  Filled 2018-05-03: qty 1

## 2018-05-03 MED ORDER — HEPARIN (PORCINE) IN NACL 1000-0.9 UT/500ML-% IV SOLN
INTRAVENOUS | Status: DC | PRN
  Administered 2018-05-03: 500 mL

## 2018-05-03 SURGICAL SUPPLY — 10 items
CATH BALLN WEDGE 5F 110CM (CATHETERS) ×2 IMPLANT
GUIDEWIRE .025 260CM (WIRE) ×2 IMPLANT
PACK CARDIAC CATHETERIZATION (CUSTOM PROCEDURE TRAY) ×2 IMPLANT
PROTECTION STATION PRESSURIZED (MISCELLANEOUS) ×3
SHEATH GLIDE SLENDER 4/5FR (SHEATH) ×2 IMPLANT
STATION PROTECTION PRESSURIZED (MISCELLANEOUS) IMPLANT
TRANSDUCER W/STOPCOCK (MISCELLANEOUS) ×2 IMPLANT
TUBING ART PRESS 72  MALE/FEM (TUBING) ×2
TUBING ART PRESS 72 MALE/FEM (TUBING) IMPLANT
WIRE EMERALD 3MM-J .025X260CM (WIRE) ×2 IMPLANT

## 2018-05-03 NOTE — Discharge Instructions (Signed)
Brachial Site Care Refer to this sheet in the next few weeks. These instructions provide you with information about caring for yourself after your procedure. Your health care provider may also give you more specific instructions. Your treatment has been planned according to current medical practices, but problems sometimes occur. Call your health care provider if you have any problems or questions after your procedure. What can I expect after the procedure? After your procedure, it is typical to have the following:  Bruising at the brachial site that usually fades within 1-2 weeks.  Blood collecting in the tissue (hematoma) that may be painful to the touch. It should usually decrease in size and tenderness within 1-2 weeks.  Follow these instructions at home:  Take medicines only as directed by your health care provider.  You may shower 24-48 hours after the procedure or as directed by your health care provider. Remove the bandage (dressing) and gently wash the site with plain soap and water. Pat the area dry with a clean towel. Do not rub the site, because this may cause bleeding.  Do not take baths, swim, or use a hot tub until your health care provider approves.  Check your insertion site every day for redness, swelling, or drainage.  Do not apply powder or lotion to the site.  Do not flex or bend the affected arm for 24 hours or as directed by your health care provider.  Do not push or pull heavy objects with the affected arm for 24 hours or as directed by your health care provider.  Do not lift over 10 lb (4.5 kg) for 3 days after your procedure or as directed by your health care provider.  Ask your health care provider when it is okay to: ? Return to work or school. ? Resume usual physical activities or sports. ? Resume sexual activity.  Do not drive home if you are discharged the same day as the procedure. Have someone else drive you.  You may drive 24 hours after the  procedure unless otherwise instructed by your health care provider.  Do not operate machinery or power tools for 24 hours after the procedure.  If your procedure was done as an outpatient procedure, which means that you went home the same day as your procedure, a responsible adult should be with you for the first 24 hours after you arrive home.  Keep all follow-up visits as directed by your health care provider. This is important. Contact a health care provider if:  You have a fever.  You have chills.  You have increased bleeding from the brachial site. Hold pressure on the site. Get help right away if:  You have unusual pain at the brachial site.  You have redness, warmth, or swelling at the brachial site.  You have drainage (other than a small amount of blood on the dressing) from the brachial site.  The brachial site is bleeding, and the bleeding does not stop after 30 minutes of holding steady pressure on the site. This information is not intended to replace advice given to you by your health care provider. Make sure you discuss any questions you have with your health care provider. Document Released: 08/29/2010 Document Revised: 01/02/2016 Document Reviewed: 02/12/2014 Elsevier Interactive Patient Education  2018 [employer].

## 2018-05-03 NOTE — Interval H&P Note (Signed)
History and Physical Interval Note:  05/03/2018 10:43 AM  Rachel Oneill  has presented today for surgery, with the diagnosis of hp  The various methods of treatment have been discussed with the patient and family. After consideration of risks, benefits and other options for treatment, the patient has consented to  Procedure(s): RIGHT HEART CATH (N/A) as a surgical intervention .  The patient's history has been reviewed, patient examined, no change in status, stable for surgery.  I have reviewed the patient's chart and labs.  Questions were answered to the patient's satisfaction.     Yates Decamp

## 2018-05-04 MED FILL — Heparin Sod (Porcine)-NaCl IV Soln 1000 Unit/500ML-0.9%: INTRAVENOUS | Qty: 500 | Status: AC

## 2018-05-04 NOTE — Telephone Encounter (Signed)
lmtcb

## 2018-05-06 ENCOUNTER — Ambulatory Visit
Admission: RE | Admit: 2018-05-06 | Discharge: 2018-05-06 | Disposition: A | Discharge status: Home / Self Care | Payer: Medicare Other | Source: Ambulatory Visit | Attending: Nephrology | Admitting: Nephrology

## 2018-05-06 DIAGNOSIS — N183 Chronic kidney disease, stage 3 unspecified: Secondary | ICD-10-CM

## 2018-05-09 ENCOUNTER — Ambulatory Visit (INDEPENDENT_AMBULATORY_CARE_PROVIDER_SITE_OTHER)
Admission: RE | Admit: 2018-05-09 | Discharge: 2018-05-09 | Disposition: A | Discharge status: Home / Self Care | Payer: Medicare Other | Source: Ambulatory Visit | Attending: Adult Health | Admitting: Adult Health

## 2018-05-09 DIAGNOSIS — J90 Pleural effusion, not elsewhere classified: Secondary | ICD-10-CM | POA: Diagnosis not present

## 2018-05-09 DIAGNOSIS — R0602 Shortness of breath: Secondary | ICD-10-CM | POA: Diagnosis not present

## 2018-05-09 DIAGNOSIS — J439 Emphysema, unspecified: Secondary | ICD-10-CM | POA: Diagnosis not present

## 2018-05-09 NOTE — Telephone Encounter (Signed)
Letter has been sent to patient to have her to call us back. Will close this encounter.

## 2018-05-11 ENCOUNTER — Other Ambulatory Visit: Payer: Self-pay | Admitting: Nephrology

## 2018-05-11 DIAGNOSIS — N183 Chronic kidney disease, stage 3 unspecified: Secondary | ICD-10-CM

## 2018-05-11 DIAGNOSIS — I129 Hypertensive chronic kidney disease with stage 1 through stage 4 chronic kidney disease, or unspecified chronic kidney disease: Secondary | ICD-10-CM

## 2018-05-13 ENCOUNTER — Other Ambulatory Visit: Payer: Self-pay | Admitting: Nephrology

## 2018-05-13 DIAGNOSIS — R809 Proteinuria, unspecified: Secondary | ICD-10-CM

## 2018-05-13 DIAGNOSIS — I2510 Atherosclerotic heart disease of native coronary artery without angina pectoris: Secondary | ICD-10-CM

## 2018-05-13 DIAGNOSIS — N183 Chronic kidney disease, stage 3 unspecified: Secondary | ICD-10-CM

## 2018-05-13 DIAGNOSIS — Z95810 Presence of automatic (implantable) cardiac defibrillator: Secondary | ICD-10-CM | POA: Diagnosis not present

## 2018-05-13 DIAGNOSIS — Z4502 Encounter for adjustment and management of automatic implantable cardiac defibrillator: Secondary | ICD-10-CM | POA: Diagnosis not present

## 2018-05-13 DIAGNOSIS — I5043 Acute on chronic combined systolic (congestive) and diastolic (congestive) heart failure: Secondary | ICD-10-CM | POA: Diagnosis not present

## 2018-05-13 DIAGNOSIS — I129 Hypertensive chronic kidney disease with stage 1 through stage 4 chronic kidney disease, or unspecified chronic kidney disease: Secondary | ICD-10-CM

## 2018-05-18 DIAGNOSIS — I5043 Acute on chronic combined systolic (congestive) and diastolic (congestive) heart failure: Secondary | ICD-10-CM | POA: Diagnosis not present

## 2018-05-18 DIAGNOSIS — I2510 Atherosclerotic heart disease of native coronary artery without angina pectoris: Secondary | ICD-10-CM | POA: Diagnosis not present

## 2018-05-18 DIAGNOSIS — Z95810 Presence of automatic (implantable) cardiac defibrillator: Secondary | ICD-10-CM | POA: Diagnosis not present

## 2018-05-18 DIAGNOSIS — Z4502 Encounter for adjustment and management of automatic implantable cardiac defibrillator: Secondary | ICD-10-CM | POA: Diagnosis not present

## 2018-05-19 ENCOUNTER — Other Ambulatory Visit: Payer: Self-pay | Admitting: Cardiology

## 2018-05-19 ENCOUNTER — Ambulatory Visit (HOSPITAL_COMMUNITY)
Admission: RE | Admit: 2018-05-19 | Discharge: 2018-05-19 | Disposition: A | Discharge status: Home / Self Care | Payer: Medicare Other | Source: Ambulatory Visit | Attending: Cardiology | Admitting: Cardiology

## 2018-05-19 DIAGNOSIS — E1122 Type 2 diabetes mellitus with diabetic chronic kidney disease: Secondary | ICD-10-CM | POA: Insufficient documentation

## 2018-05-19 DIAGNOSIS — I255 Ischemic cardiomyopathy: Secondary | ICD-10-CM | POA: Insufficient documentation

## 2018-05-19 DIAGNOSIS — Z720 Tobacco use: Secondary | ICD-10-CM | POA: Insufficient documentation

## 2018-05-19 DIAGNOSIS — N184 Chronic kidney disease, stage 4 (severe): Secondary | ICD-10-CM | POA: Insufficient documentation

## 2018-05-19 DIAGNOSIS — R188 Other ascites: Secondary | ICD-10-CM

## 2018-05-19 DIAGNOSIS — I129 Hypertensive chronic kidney disease with stage 1 through stage 4 chronic kidney disease, or unspecified chronic kidney disease: Secondary | ICD-10-CM | POA: Diagnosis not present

## 2018-05-19 DIAGNOSIS — J449 Chronic obstructive pulmonary disease, unspecified: Secondary | ICD-10-CM | POA: Insufficient documentation

## 2018-05-19 DIAGNOSIS — Z9981 Dependence on supplemental oxygen: Secondary | ICD-10-CM | POA: Insufficient documentation

## 2018-05-19 DIAGNOSIS — Z951 Presence of aortocoronary bypass graft: Secondary | ICD-10-CM | POA: Diagnosis not present

## 2018-05-19 MED ORDER — LIDOCAINE HCL 1 % IJ SOLN
INTRAMUSCULAR | Status: AC
  Filled 2018-05-19: qty 20

## 2018-05-19 NOTE — Progress Notes (Signed)
Patient with history of ischemic cardiomyopathy s/p CABG, severe LV dysfunction, CKD IV, HTN, DM II, COPD with continued tobacco use and oxygen dependent who requested paracentesis appointment from her PCP today due to worsening dyspnea which she was concerned was due to ascites.  Patient states she does not have much abdominal distention, minimal pain, some constipation however she has been having progressive dyspnea over several weeks and last time this occurred she had a paracentesis which helped her symptoms. Per chart last IR paracentesis 04/05/18 with 1.4L removed.   Limited abdominal US performed today which showed minimal ascites not amenable to safe drainage. Discussed with patient that the risks of proceeding with procedure currently outweigh benefits to which she stated understanding and agreed with not proceeding with paracentesis today.   Discussed further follow up with PCP regarding worsening dyspnea to which patient stated that she will pursue this later today.   Please call IR with questions or concerns.  Lynnette Caffey, PA-C 05/19/18 1308

## 2018-06-01 ENCOUNTER — Ambulatory Visit: Payer: Medicare Other | Admitting: Gastroenterology

## 2018-06-01 ENCOUNTER — Other Ambulatory Visit: Payer: Self-pay | Admitting: *Deleted

## 2018-06-01 ENCOUNTER — Telehealth: Payer: Self-pay | Admitting: Internal Medicine

## 2018-06-01 DIAGNOSIS — Z0000 Encounter for general adult medical examination without abnormal findings: Secondary | ICD-10-CM

## 2018-06-01 DIAGNOSIS — N183 Chronic kidney disease, stage 3 unspecified: Secondary | ICD-10-CM

## 2018-06-01 DIAGNOSIS — I5022 Chronic systolic (congestive) heart failure: Secondary | ICD-10-CM

## 2018-06-01 DIAGNOSIS — J449 Chronic obstructive pulmonary disease, unspecified: Secondary | ICD-10-CM

## 2018-06-01 NOTE — Telephone Encounter (Signed)
Pt was notified. I have put in an order for [facility] help and within that order I did an order for bedpan

## 2018-06-01 NOTE — Patient Outreach (Signed)
[facility]) Care Management  06/01/2018  CARLICIA LEAVENS 12/03/1950 [id number]   Telephone Screen  Referral Date: 06/01/18 Referral Source: [facility]  Referral Reason: home health evaluation and order for BEDPAN    Insurance: medicare    Outreach attempt #1 successful at the home number listed in Epic  Patient is able to verify HIPAA Reviewed and addressed referral to [facility] with patient Mrs Sisler told [facility] RN CM that she was informed that "someone would be coming out to my house tomorrow" [facility] RN CM discussed with Mrs Zuidema that [facility] may be the company that she was told would come out to visit her but [facility] is not a home health agency. She voiced understanding  CM discussed the differences in [facility] and home health and provided a list of names of home health agencies for Mrs Armato's choice selection She states she wants her daughter to choose the name of the agency  She states her daughter is at work at this time and she would call her daughter to get her to contact CM  CM left her office number for contact  Mrs Crain confirms she is at her ex husband's home but would be returning to her address on 06/02/18    [employer] RN CM called her daughter's number and left a message to include CM's contact number   [facility] RN [facility] and spoke with Herminio Commons, CMA, to discuss that [facility] is not home health and to give her an update on the call information to Mrs Vahle and the message left for her daughter. Martie Lee is to attempt to reach [facility] for DME and services for this patient. Made Sabrina aware that Mrs Pawlicki states she will be returning to her address on 06/02/18   CM returned a call to Mrs Swingler to update her that Martie Lee would be working on home health and DME services for her   During the conversation with Mrs Kuhlmann, her daughter was noted to be calling, CM informed Mrs Likes who requested and gave CM permission to disconnect  with her and speak with Lovell Sheehan, her daughter  [facility] RN CM spoke with [facility] and updated her on the referral, call to the MD office, pending referral to a home health agency that may be [facility] and that CM was speaking to Mrs Merida just now to update her CM began further assessment of needs for Mrs Lynes, especially when [facility] inquired if Home health staff would be able to "sit with her", to find out that [facility] Services will be needed  see below   Social: Mrs Moorehouse is presently on 06/01/18 staying at her ex husband's home (after [facility] asked for assistance) and is scheduled per Mrs Hockenbury to return to her home on 06/02/18.  She has been primarily supported by her daughter, Lovell Sheehan. Lovell Sheehan has been attempting to work and take care of her children and Mrs Farrington. Lovell Sheehan has been providing transportation to medical appointments using FMLA services. Samone states she has found out in the recent care of her mother that Mrs Billing has a lot of major medical conditions that she had not been sharing with her and other family members  Lovell Sheehan confirms Mrs Schifano is not able to get up to use bathroom by herself and stays short of breath. Samone confirms is has not been good for Mrs Maiden to be by herself for long periods of time r/t sob and anxiety concerns Samone reports issues with swelling  in Mrs Vining's legs, worsening dyspnea  Lovell Sheehan states her brother & his wife will be visiting starting on 06/02/18 She reports her brother is not aware of the care Mrs Cisar is requiring. Samone states she will be getting her brother to stay with Mrs Zirpoli at Mrs Laveck apartment on 06/02/18 after a medical appointment is completed   Cm reviewed with Samone the services of [facility], Home health, personal care services, the differences in each and the general coverage for each type of services. Samone agrees Mrs Fulp will benefit from [facility] and [facility] RN CM. She voices understanding about referral response times for  home health and [facility] services and states she can assist providing a safe environment for Mrs Winborn until services initiated.  She voiced understanding that there is a process for personal care services and an out of pocket expense may be discussed. 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 Severe OSA, hyperlipidemia, vitamin D deficiency, Chronic respiratory failure with hypoxia   DME Oxygen dependent 2 L/Min with activity, glucose meter, CPAP   Appointments: She was seen at her primary MD staff, Hetty Blend, NP on 05/19/18   Dr Kennedy Bucker, Gastroenterologist was seen today 06/01/18 at 1345  Dr Lucianne Muss , Endocrinologist was last seen on 04/26/18, scheduled to bee seen on 07/11/18 1530  Dr Jacinto Halim, Cardiologist dr Jacinto Halim was seen on 05/01/18  Dr Aretha Parrot Parrett/ pulmonology last seen 04/25/28 scheduled to be seen on 06/02/18 at 11 am    Advance Directives: A MOST form has been completed with her primary MD staff since 02/22/18 only   Medications denies concerns with taking medications as prescribed, affording medications, side effects of medications and questions about medications  Consent: [facility] RN CM reviewed [facility] services with patient. Patient gave verbal consent for services.  Plan: [facility] RN CM will refer Ms Dreyfuss to [facility] (support/personal care services, [employer], alternate transportation services, advance directives?)  and [employer] Nurse, children's CM (disease management, lives alone, high ER/hospitalization utilization -5 ED visits in the last 6 months, level of care concerns, difficulties with ADLs, DME needs?)   [facility] RN CM sent a successful outreach letter as discussed with [facility] brochure enclosed for review Pt encouraged to return a call to [facility] RN CM prn   Artesha Wemhoff L. Noelle Penner, RN, BSN, CCM [facility] Telephonic Care Management Care Coordinator Office number [phone] Mobile number [phone]  Main [facility] number [phone] Fax number [phone]

## 2018-06-01 NOTE — Addendum Note (Signed)
Addended by: Herminio Commons A on: 06/01/2018 04:44 PM   Modules accepted: Orders

## 2018-06-01 NOTE — Telephone Encounter (Signed)
Pt called and states she would like a bed pan. Right now she is staying at her ex husband house and he is helping her out. She does get out of breath when getting up to go to the bathroom so she is wanting to know if she can have a bed pain. Pt is not on palliative care as she did not know what that is. She is on daily oxygen. Please advise. Pt states she can not come in. She follows-up with Dr. Jacinto Halim in 2-3 weeks she says

## 2018-06-01 NOTE — Telephone Encounter (Signed)
It sounds like we need to have home health go out and do an evaluation to see how we can help her. Please arrange this through [facility] if they are available in the next few days. Ok to give her an order for a bedpan as well while waiting for [facility] to help her.

## 2018-06-02 ENCOUNTER — Ambulatory Visit: Payer: Medicare Other | Admitting: Pulmonary Disease

## 2018-06-02 DIAGNOSIS — E1159 Type 2 diabetes mellitus with other circulatory complications: Secondary | ICD-10-CM | POA: Diagnosis not present

## 2018-06-02 DIAGNOSIS — I10 Essential (primary) hypertension: Secondary | ICD-10-CM | POA: Diagnosis not present

## 2018-06-02 DIAGNOSIS — I429 Cardiomyopathy, unspecified: Secondary | ICD-10-CM | POA: Diagnosis not present

## 2018-06-02 DIAGNOSIS — I25119 Atherosclerotic heart disease of native coronary artery with unspecified angina pectoris: Secondary | ICD-10-CM | POA: Diagnosis not present

## 2018-06-02 DIAGNOSIS — R188 Other ascites: Secondary | ICD-10-CM | POA: Diagnosis not present

## 2018-06-02 DIAGNOSIS — I5020 Unspecified systolic (congestive) heart failure: Secondary | ICD-10-CM | POA: Diagnosis not present

## 2018-06-02 DIAGNOSIS — E039 Hypothyroidism, unspecified: Secondary | ICD-10-CM | POA: Diagnosis not present

## 2018-06-02 DIAGNOSIS — I270 Primary pulmonary hypertension: Secondary | ICD-10-CM | POA: Diagnosis not present

## 2018-06-02 DIAGNOSIS — N184 Chronic kidney disease, stage 4 (severe): Secondary | ICD-10-CM | POA: Diagnosis not present

## 2018-06-02 DIAGNOSIS — K7460 Unspecified cirrhosis of liver: Secondary | ICD-10-CM | POA: Diagnosis not present

## 2018-06-02 DIAGNOSIS — E785 Hyperlipidemia, unspecified: Secondary | ICD-10-CM | POA: Diagnosis not present

## 2018-06-03 DIAGNOSIS — R188 Other ascites: Secondary | ICD-10-CM | POA: Diagnosis not present

## 2018-06-03 DIAGNOSIS — I270 Primary pulmonary hypertension: Secondary | ICD-10-CM | POA: Diagnosis not present

## 2018-06-03 DIAGNOSIS — I25119 Atherosclerotic heart disease of native coronary artery with unspecified angina pectoris: Secondary | ICD-10-CM | POA: Diagnosis not present

## 2018-06-03 DIAGNOSIS — I429 Cardiomyopathy, unspecified: Secondary | ICD-10-CM | POA: Diagnosis not present

## 2018-06-03 DIAGNOSIS — K7460 Unspecified cirrhosis of liver: Secondary | ICD-10-CM | POA: Diagnosis not present

## 2018-06-03 DIAGNOSIS — I5020 Unspecified systolic (congestive) heart failure: Secondary | ICD-10-CM | POA: Diagnosis not present

## 2018-06-06 ENCOUNTER — Other Ambulatory Visit: Payer: Self-pay

## 2018-06-06 DIAGNOSIS — I25119 Atherosclerotic heart disease of native coronary artery with unspecified angina pectoris: Secondary | ICD-10-CM | POA: Diagnosis not present

## 2018-06-06 DIAGNOSIS — I429 Cardiomyopathy, unspecified: Secondary | ICD-10-CM | POA: Diagnosis not present

## 2018-06-06 DIAGNOSIS — K7460 Unspecified cirrhosis of liver: Secondary | ICD-10-CM | POA: Diagnosis not present

## 2018-06-06 DIAGNOSIS — I270 Primary pulmonary hypertension: Secondary | ICD-10-CM | POA: Diagnosis not present

## 2018-06-06 DIAGNOSIS — R188 Other ascites: Secondary | ICD-10-CM | POA: Diagnosis not present

## 2018-06-06 DIAGNOSIS — I5020 Unspecified systolic (congestive) heart failure: Secondary | ICD-10-CM | POA: Diagnosis not present

## 2018-06-06 NOTE — Patient Outreach (Addendum)
[facility]) Care Management  06/06/2018  JATERRIA NYLANDER [birth year] [id number]   Referral received per telephonic care coordinator on 06/02/18. RNCM called to schedule a home visit. Spoke with Mrs. Beadle. RNCM introduced self and request to schedule time to complete assessment/home visit. Mrs. Gawrych states she has all the resources she needs right now. She decline Care Management services. At this time. RNCM reinforced the different services offered through [facility] Care Management. Client continues to decline services. RNCM discussed the difference between home health and care management. Client continues to decline services.  RNCM encouraged client to call RNCM if she has an care management needs.  RNCM called [facility] social work, Contractor who is referred for social work needs. Ms. Shella Spearing will reach out to client tomorrow to attempt to engage.   RNCM called primary care informed of the above.   RNCM will not be involved in case, but if client is agreeable at a later date, will be happy to re-engage. Note routed to primary care.   Kathyrn Sheriff, RN, MSN, [facility] Community Care Coordinator Cell: [phone]

## 2018-06-07 ENCOUNTER — Telehealth: Payer: Self-pay | Admitting: Internal Medicine

## 2018-06-07 NOTE — Telephone Encounter (Signed)
Rachel Oneill called and states that pthas declined [facility] health care management services. She states she has all the resources she needs right now and does need them. A social worker will reach out to patient today to try to get her to agree with some services.   Rachel Oneill- [phone]

## 2018-06-08 ENCOUNTER — Telehealth: Payer: Self-pay

## 2018-06-08 ENCOUNTER — Other Ambulatory Visit: Payer: Self-pay

## 2018-06-08 ENCOUNTER — Other Ambulatory Visit: Payer: Self-pay | Admitting: Family Medicine

## 2018-06-08 DIAGNOSIS — I429 Cardiomyopathy, unspecified: Secondary | ICD-10-CM | POA: Diagnosis not present

## 2018-06-08 DIAGNOSIS — I25119 Atherosclerotic heart disease of native coronary artery with unspecified angina pectoris: Secondary | ICD-10-CM | POA: Diagnosis not present

## 2018-06-08 DIAGNOSIS — K7460 Unspecified cirrhosis of liver: Secondary | ICD-10-CM | POA: Diagnosis not present

## 2018-06-08 DIAGNOSIS — R188 Other ascites: Secondary | ICD-10-CM | POA: Diagnosis not present

## 2018-06-08 DIAGNOSIS — I5020 Unspecified systolic (congestive) heart failure: Secondary | ICD-10-CM | POA: Diagnosis not present

## 2018-06-08 DIAGNOSIS — I270 Primary pulmonary hypertension: Secondary | ICD-10-CM | POA: Diagnosis not present

## 2018-06-08 MED ORDER — COLCHICINE 0.6 MG PO TABS: ORAL_TABLET | ORAL | 0 refills | Status: DC

## 2018-06-08 NOTE — Telephone Encounter (Signed)
Spoke to [place] and approved SN, PT and OT. She also wants to know if will prescribe some gout medication without seeing her

## 2018-06-08 NOTE — Telephone Encounter (Signed)
Please call her and let her know that I sent in colchicine to treat her acute gout flare up. She took this in May when she went to the ED for an acute flare up. Have her stop the atorvastatin for a couple of days while taking this.  I would like for her to come in when the flare up is over so I can check her uric acid level.  She may need to be on a preventive medication.

## 2018-06-08 NOTE — Telephone Encounter (Signed)
Pt was notified. She said she would try to come in after flare up to see vickie or do a nurse visit but shes not sure if she can. Vickie was aware of this.

## 2018-06-08 NOTE — Progress Notes (Signed)
   Subjective:    Patient ID: Rachel Oneill, female    DOB: 1950-11-02, 67 y.o.   MRN: [id number]  HPI    Review of Systems     Objective:   Physical Exam        Assessment & Plan:

## 2018-06-08 NOTE — Telephone Encounter (Signed)
Ok to order this for her. If she is able to come in I am happy to see her or we can get home health out to see her and let me know what is going on if she is unable to come in.

## 2018-06-08 NOTE — Telephone Encounter (Signed)
Morrie Sheldon called from [facility] to get verbal approval for Skilled nursing, physical therapy and occupational therapy for patient.    When she saw patient yesterday she had significant swelling bilateral lower extremities according to daughter it has improved over the past couple of weeks. Pain in right foot, unable to stand and walk, painful to the touch. Morrie Sheldon wants to know if you would like to see patient for an appointment since she has a history of gout.   Morrie Sheldon can be reached at [phone]

## 2018-06-08 NOTE — Patient Outreach (Signed)
[facility]) Care Management  06/08/2018  Rachel Oneill Jan 06, 1951 [id number]   Social work referral received from telephonic care coordinator on 06/02/18.  RNCM, Ann Lions, outreached patient on 06/06/18 and patient declined all care management services at that time.  BSW called patient today to reinforce services and attempt to engage.  Rachel Oneill continues to decline services.  Closing case at this time.  Will notify primary care.   Malachy Chamber, BSW Social Worker [phone]

## 2018-06-08 NOTE — Telephone Encounter (Signed)
Left message for Rachel Oneill to call me back

## 2018-06-09 ENCOUNTER — Telehealth: Payer: Self-pay | Admitting: Family Medicine

## 2018-06-09 DIAGNOSIS — K7460 Unspecified cirrhosis of liver: Secondary | ICD-10-CM | POA: Diagnosis not present

## 2018-06-09 DIAGNOSIS — I429 Cardiomyopathy, unspecified: Secondary | ICD-10-CM | POA: Diagnosis not present

## 2018-06-09 DIAGNOSIS — I25119 Atherosclerotic heart disease of native coronary artery with unspecified angina pectoris: Secondary | ICD-10-CM | POA: Diagnosis not present

## 2018-06-09 DIAGNOSIS — R188 Other ascites: Secondary | ICD-10-CM | POA: Diagnosis not present

## 2018-06-09 DIAGNOSIS — I5020 Unspecified systolic (congestive) heart failure: Secondary | ICD-10-CM | POA: Diagnosis not present

## 2018-06-09 DIAGNOSIS — I270 Primary pulmonary hypertension: Secondary | ICD-10-CM | POA: Diagnosis not present

## 2018-06-09 NOTE — Telephone Encounter (Signed)
Will Schalla with [facility] called. He is requested Occupational Therapy twice a week for 4 weeks for this patient. Please advise at [phone].

## 2018-06-09 NOTE — Telephone Encounter (Signed)
Will was notified.  

## 2018-06-09 NOTE — Telephone Encounter (Signed)
This is fine 

## 2018-06-10 DIAGNOSIS — I25119 Atherosclerotic heart disease of native coronary artery with unspecified angina pectoris: Secondary | ICD-10-CM | POA: Diagnosis not present

## 2018-06-10 DIAGNOSIS — E039 Hypothyroidism, unspecified: Secondary | ICD-10-CM | POA: Diagnosis not present

## 2018-06-10 DIAGNOSIS — I429 Cardiomyopathy, unspecified: Secondary | ICD-10-CM | POA: Diagnosis not present

## 2018-06-10 DIAGNOSIS — E785 Hyperlipidemia, unspecified: Secondary | ICD-10-CM | POA: Diagnosis not present

## 2018-06-10 DIAGNOSIS — I10 Essential (primary) hypertension: Secondary | ICD-10-CM | POA: Diagnosis not present

## 2018-06-10 DIAGNOSIS — I5020 Unspecified systolic (congestive) heart failure: Secondary | ICD-10-CM | POA: Diagnosis not present

## 2018-06-10 DIAGNOSIS — K7460 Unspecified cirrhosis of liver: Secondary | ICD-10-CM | POA: Diagnosis not present

## 2018-06-10 DIAGNOSIS — E1159 Type 2 diabetes mellitus with other circulatory complications: Secondary | ICD-10-CM | POA: Diagnosis not present

## 2018-06-10 DIAGNOSIS — N184 Chronic kidney disease, stage 4 (severe): Secondary | ICD-10-CM | POA: Diagnosis not present

## 2018-06-10 DIAGNOSIS — I270 Primary pulmonary hypertension: Secondary | ICD-10-CM | POA: Diagnosis not present

## 2018-06-10 DIAGNOSIS — R188 Other ascites: Secondary | ICD-10-CM | POA: Diagnosis not present

## 2018-06-13 DIAGNOSIS — I5020 Unspecified systolic (congestive) heart failure: Secondary | ICD-10-CM | POA: Diagnosis not present

## 2018-06-13 DIAGNOSIS — I270 Primary pulmonary hypertension: Secondary | ICD-10-CM | POA: Diagnosis not present

## 2018-06-13 DIAGNOSIS — I5043 Acute on chronic combined systolic (congestive) and diastolic (congestive) heart failure: Secondary | ICD-10-CM | POA: Diagnosis not present

## 2018-06-13 DIAGNOSIS — I25119 Atherosclerotic heart disease of native coronary artery with unspecified angina pectoris: Secondary | ICD-10-CM | POA: Diagnosis not present

## 2018-06-13 DIAGNOSIS — Z95810 Presence of automatic (implantable) cardiac defibrillator: Secondary | ICD-10-CM | POA: Diagnosis not present

## 2018-06-13 DIAGNOSIS — R188 Other ascites: Secondary | ICD-10-CM | POA: Diagnosis not present

## 2018-06-13 DIAGNOSIS — I429 Cardiomyopathy, unspecified: Secondary | ICD-10-CM | POA: Diagnosis not present

## 2018-06-13 DIAGNOSIS — Z4502 Encounter for adjustment and management of automatic implantable cardiac defibrillator: Secondary | ICD-10-CM | POA: Diagnosis not present

## 2018-06-13 DIAGNOSIS — K7460 Unspecified cirrhosis of liver: Secondary | ICD-10-CM | POA: Diagnosis not present

## 2018-06-14 DIAGNOSIS — I429 Cardiomyopathy, unspecified: Secondary | ICD-10-CM | POA: Diagnosis not present

## 2018-06-14 DIAGNOSIS — K7460 Unspecified cirrhosis of liver: Secondary | ICD-10-CM | POA: Diagnosis not present

## 2018-06-14 DIAGNOSIS — R188 Other ascites: Secondary | ICD-10-CM | POA: Diagnosis not present

## 2018-06-14 DIAGNOSIS — I5020 Unspecified systolic (congestive) heart failure: Secondary | ICD-10-CM | POA: Diagnosis not present

## 2018-06-14 DIAGNOSIS — I25119 Atherosclerotic heart disease of native coronary artery with unspecified angina pectoris: Secondary | ICD-10-CM | POA: Diagnosis not present

## 2018-06-14 DIAGNOSIS — I270 Primary pulmonary hypertension: Secondary | ICD-10-CM | POA: Diagnosis not present

## 2018-06-15 DIAGNOSIS — I25119 Atherosclerotic heart disease of native coronary artery with unspecified angina pectoris: Secondary | ICD-10-CM | POA: Diagnosis not present

## 2018-06-15 DIAGNOSIS — K7460 Unspecified cirrhosis of liver: Secondary | ICD-10-CM | POA: Diagnosis not present

## 2018-06-15 DIAGNOSIS — I270 Primary pulmonary hypertension: Secondary | ICD-10-CM | POA: Diagnosis not present

## 2018-06-15 DIAGNOSIS — R188 Other ascites: Secondary | ICD-10-CM | POA: Diagnosis not present

## 2018-06-15 DIAGNOSIS — I5020 Unspecified systolic (congestive) heart failure: Secondary | ICD-10-CM | POA: Diagnosis not present

## 2018-06-15 DIAGNOSIS — I429 Cardiomyopathy, unspecified: Secondary | ICD-10-CM | POA: Diagnosis not present

## 2018-06-16 DIAGNOSIS — I25119 Atherosclerotic heart disease of native coronary artery with unspecified angina pectoris: Secondary | ICD-10-CM | POA: Diagnosis not present

## 2018-06-16 DIAGNOSIS — K7460 Unspecified cirrhosis of liver: Secondary | ICD-10-CM | POA: Diagnosis not present

## 2018-06-16 DIAGNOSIS — I429 Cardiomyopathy, unspecified: Secondary | ICD-10-CM | POA: Diagnosis not present

## 2018-06-16 DIAGNOSIS — I270 Primary pulmonary hypertension: Secondary | ICD-10-CM | POA: Diagnosis not present

## 2018-06-16 DIAGNOSIS — I5020 Unspecified systolic (congestive) heart failure: Secondary | ICD-10-CM | POA: Diagnosis not present

## 2018-06-16 DIAGNOSIS — R188 Other ascites: Secondary | ICD-10-CM | POA: Diagnosis not present

## 2018-06-17 DIAGNOSIS — I429 Cardiomyopathy, unspecified: Secondary | ICD-10-CM | POA: Diagnosis not present

## 2018-06-17 DIAGNOSIS — I270 Primary pulmonary hypertension: Secondary | ICD-10-CM | POA: Diagnosis not present

## 2018-06-17 DIAGNOSIS — I25119 Atherosclerotic heart disease of native coronary artery with unspecified angina pectoris: Secondary | ICD-10-CM | POA: Diagnosis not present

## 2018-06-17 DIAGNOSIS — R188 Other ascites: Secondary | ICD-10-CM | POA: Diagnosis not present

## 2018-06-17 DIAGNOSIS — K7460 Unspecified cirrhosis of liver: Secondary | ICD-10-CM | POA: Diagnosis not present

## 2018-06-17 DIAGNOSIS — I5020 Unspecified systolic (congestive) heart failure: Secondary | ICD-10-CM | POA: Diagnosis not present

## 2018-06-20 DIAGNOSIS — K7460 Unspecified cirrhosis of liver: Secondary | ICD-10-CM | POA: Diagnosis not present

## 2018-06-20 DIAGNOSIS — I25119 Atherosclerotic heart disease of native coronary artery with unspecified angina pectoris: Secondary | ICD-10-CM | POA: Diagnosis not present

## 2018-06-20 DIAGNOSIS — R279 Unspecified lack of coordination: Secondary | ICD-10-CM | POA: Diagnosis not present

## 2018-06-20 DIAGNOSIS — I5020 Unspecified systolic (congestive) heart failure: Secondary | ICD-10-CM | POA: Diagnosis not present

## 2018-06-20 DIAGNOSIS — R188 Other ascites: Secondary | ICD-10-CM | POA: Diagnosis not present

## 2018-06-20 DIAGNOSIS — I270 Primary pulmonary hypertension: Secondary | ICD-10-CM | POA: Diagnosis not present

## 2018-06-20 DIAGNOSIS — I429 Cardiomyopathy, unspecified: Secondary | ICD-10-CM | POA: Diagnosis not present

## 2018-06-20 DIAGNOSIS — Z743 Need for continuous supervision: Secondary | ICD-10-CM | POA: Diagnosis not present

## 2018-06-21 ENCOUNTER — Non-Acute Institutional Stay (SKILLED_NURSING_FACILITY): Admitting: Internal Medicine

## 2018-06-21 ENCOUNTER — Encounter: Payer: Self-pay | Admitting: Internal Medicine

## 2018-06-21 DIAGNOSIS — I25119 Atherosclerotic heart disease of native coronary artery with unspecified angina pectoris: Secondary | ICD-10-CM | POA: Diagnosis not present

## 2018-06-21 DIAGNOSIS — G4733 Obstructive sleep apnea (adult) (pediatric): Secondary | ICD-10-CM | POA: Diagnosis not present

## 2018-06-21 DIAGNOSIS — N183 Chronic kidney disease, stage 3 unspecified: Secondary | ICD-10-CM

## 2018-06-21 DIAGNOSIS — J9611 Chronic respiratory failure with hypoxia: Secondary | ICD-10-CM

## 2018-06-21 DIAGNOSIS — I270 Primary pulmonary hypertension: Secondary | ICD-10-CM | POA: Diagnosis not present

## 2018-06-21 DIAGNOSIS — E1121 Type 2 diabetes mellitus with diabetic nephropathy: Secondary | ICD-10-CM

## 2018-06-21 DIAGNOSIS — R188 Other ascites: Secondary | ICD-10-CM | POA: Diagnosis not present

## 2018-06-21 DIAGNOSIS — K7460 Unspecified cirrhosis of liver: Secondary | ICD-10-CM | POA: Diagnosis not present

## 2018-06-21 DIAGNOSIS — I5020 Unspecified systolic (congestive) heart failure: Secondary | ICD-10-CM | POA: Diagnosis not present

## 2018-06-21 DIAGNOSIS — D539 Nutritional anemia, unspecified: Secondary | ICD-10-CM | POA: Insufficient documentation

## 2018-06-21 DIAGNOSIS — I429 Cardiomyopathy, unspecified: Secondary | ICD-10-CM | POA: Diagnosis not present

## 2018-06-21 NOTE — Assessment & Plan Note (Signed)
CKD may be associated with some discordance between A1c and glucoses

## 2018-06-21 NOTE — Assessment & Plan Note (Signed)
Continue as needed pulmonary toilet

## 2018-06-21 NOTE — Assessment & Plan Note (Signed)
On oral supplement 

## 2018-06-21 NOTE — Patient Instructions (Signed)
See assessment and plan under each diagnosis in the problem list and acutely for this visit 

## 2018-06-21 NOTE — Assessment & Plan Note (Signed)
A1c is current and the value of 8.1% is adequate goal based on her multiple comorbidities

## 2018-06-21 NOTE — Progress Notes (Signed)
NURSING HOME LOCATION:  [facility] ROOM NUMBER:  [id number]  CODE STATUS:  Full Code  PCP:  Avanell Shackleton, NP-C  [address]. [place] [address]  This is a comprehensive admission note to [facility] performed on this date less than 30 days from date of admission. Included are preadmission medical/surgical history; reconciled medication list; family history; social history and comprehensive review of systems.  Corrections and additions to the records were documented. Comprehensive physical exam was also performed. Additionally a clinical summary was entered for each active diagnosis pertinent to this admission in the Problem List to enhance continuity of care.  HPI:  This is a Hospice Respite admission.  Despite being on Hospice the patient is listed as a full code.  Medical diagnoses include uncontrolled diabetes with complications, essential hypertension, congestive heart failure,OSA  and chronic bronchitis. Surgeries and procedures include coronary artery bypass grafting, ICD implant,paracentesis, and right heart cath demonstrating pulmonary hypertension.  Family history is positive for cancer in both parents. Patient is a nondrinker and former smoker. Lab studies were reviewed.  She does have chronic kidney disease with a creatinine 1.6 and GFR 41.  She also has a macrocytic anemia with hemoglobin 12 and hematocrit 36.9.  Diabetes is poorly controlled as noted by an A1c of 8.1% on 04/25/2018.  Review of systems: The patient is alert and articulate.  She describes intermittent exertional dyspnea.  She uses her albuterol on average once daily.  She does not use CPAP as she has been intolerant.  She checks her glucoses and these range 160 up to 200.  She denies any hypoglycemia.  She denies any symptoms related to diabetes.  She does have significant urinary output with Lasix.  She describes the tips of the fingers and feet staying cold.  She states that she takes oral  B12.  Constitutional: No fever, significant weight change, fatigue  Eyes: No redness, discharge, pain, vision change ENT/mouth: No nasal congestion, purulent discharge, earache, change in hearing, sore throat  Cardiovascular: No chest pain, palpitations, paroxysmal nocturnal dyspnea, claudication Respiratory: No cough, sputum production, hemoptysis,  significant snoring, apnea  Gastrointestinal: No heartburn, dysphagia, abdominal pain, nausea /vomiting, rectal bleeding, melena, change in bowels Genitourinary: No dysuria, hematuria, pyuria, incontinence, nocturia Musculoskeletal: No joint stiffness, joint swelling, weakness, pain Dermatologic: No rash, pruritus, change in appearance of skin Neurologic: No dizziness, headache, syncope, seizures, numbness, tingling Psychiatric: No significant anxiety, depression, insomnia, anorexia Endocrine: No change in hair/skin/nails, excessive thirst, excessive hunger  Hematologic/lymphatic: No significant bruising, lymphadenopathy, abnormal bleeding Allergy/immunology: No itchy/watery eyes, significant sneezing, urticaria, angioedema  Physical exam:  Pertinent or positive findings: She is wearing nasal oxygen.  She is edentulous but wears only upper plate.  Heart rhythm and rate are irregular.  Breath sounds are decreased, more so on the left than the right.  She has 1+ pitting edema of the feet.  Pedal pulses are decreased.  Scarring is present over the right shin.  Slight clubbing of the nailbeds is suggested. Symmetrically weak in all extremities.  General appearance: Adequately nourished; no acute distress, increased work of breathing is present.   Lymphatic: No lymphadenopathy about the head, neck, axilla. Eyes: No conjunctival inflammation or lid edema is present. There is no scleral icterus. Ears:  External ear exam shows no significant lesions or deformities.   Nose:  External nasal examination shows no deformity or inflammation. Nasal mucosa are  pink and moist without lesions, exudates Oral exam: Lips and gums are healthy  appearing.There is no oropharyngeal erythema or exudate. Neck:  No thyromegaly, masses, tenderness noted.    Heart:  No gallop, murmur, click, rub.  Lungs:  without wheezes, rhonchi, rales, rubs. Abdomen: Bowel sounds are normal.  Abdomen is soft and nontender with no organomegaly, hernias, masses. GU: Deferred  Extremities:  No cyanosis Neurologic exam: Balance, Rhomberg, finger to nose testing could not be completed due to clinical state Skin: Warm & dry w/o tenting. No significant lesions or rash.  See clinical summary under each active problem in the Problem List with associated updated therapeutic plan

## 2018-06-21 NOTE — Assessment & Plan Note (Signed)
Patient states she is intolerant to the CPAP mask

## 2018-06-23 DIAGNOSIS — I270 Primary pulmonary hypertension: Secondary | ICD-10-CM | POA: Diagnosis not present

## 2018-06-23 DIAGNOSIS — I5020 Unspecified systolic (congestive) heart failure: Secondary | ICD-10-CM | POA: Diagnosis not present

## 2018-06-23 DIAGNOSIS — I429 Cardiomyopathy, unspecified: Secondary | ICD-10-CM | POA: Diagnosis not present

## 2018-06-23 DIAGNOSIS — R188 Other ascites: Secondary | ICD-10-CM | POA: Diagnosis not present

## 2018-06-23 DIAGNOSIS — I25119 Atherosclerotic heart disease of native coronary artery with unspecified angina pectoris: Secondary | ICD-10-CM | POA: Diagnosis not present

## 2018-06-23 DIAGNOSIS — K7460 Unspecified cirrhosis of liver: Secondary | ICD-10-CM | POA: Diagnosis not present

## 2018-06-24 DIAGNOSIS — I25119 Atherosclerotic heart disease of native coronary artery with unspecified angina pectoris: Secondary | ICD-10-CM | POA: Diagnosis not present

## 2018-06-24 DIAGNOSIS — I429 Cardiomyopathy, unspecified: Secondary | ICD-10-CM | POA: Diagnosis not present

## 2018-06-24 DIAGNOSIS — I270 Primary pulmonary hypertension: Secondary | ICD-10-CM | POA: Diagnosis not present

## 2018-06-24 DIAGNOSIS — I5020 Unspecified systolic (congestive) heart failure: Secondary | ICD-10-CM | POA: Diagnosis not present

## 2018-06-24 DIAGNOSIS — R188 Other ascites: Secondary | ICD-10-CM | POA: Diagnosis not present

## 2018-06-24 DIAGNOSIS — K7460 Unspecified cirrhosis of liver: Secondary | ICD-10-CM | POA: Diagnosis not present

## 2018-06-25 DIAGNOSIS — R188 Other ascites: Secondary | ICD-10-CM | POA: Diagnosis not present

## 2018-06-25 DIAGNOSIS — I25119 Atherosclerotic heart disease of native coronary artery with unspecified angina pectoris: Secondary | ICD-10-CM | POA: Diagnosis not present

## 2018-06-25 DIAGNOSIS — I429 Cardiomyopathy, unspecified: Secondary | ICD-10-CM | POA: Diagnosis not present

## 2018-06-25 DIAGNOSIS — I5020 Unspecified systolic (congestive) heart failure: Secondary | ICD-10-CM | POA: Diagnosis not present

## 2018-06-25 DIAGNOSIS — K7460 Unspecified cirrhosis of liver: Secondary | ICD-10-CM | POA: Diagnosis not present

## 2018-06-25 DIAGNOSIS — I270 Primary pulmonary hypertension: Secondary | ICD-10-CM | POA: Diagnosis not present

## 2018-06-26 DIAGNOSIS — I270 Primary pulmonary hypertension: Secondary | ICD-10-CM | POA: Diagnosis not present

## 2018-06-26 DIAGNOSIS — I25119 Atherosclerotic heart disease of native coronary artery with unspecified angina pectoris: Secondary | ICD-10-CM | POA: Diagnosis not present

## 2018-06-26 DIAGNOSIS — K7460 Unspecified cirrhosis of liver: Secondary | ICD-10-CM | POA: Diagnosis not present

## 2018-06-26 DIAGNOSIS — I429 Cardiomyopathy, unspecified: Secondary | ICD-10-CM | POA: Diagnosis not present

## 2018-06-26 DIAGNOSIS — I5020 Unspecified systolic (congestive) heart failure: Secondary | ICD-10-CM | POA: Diagnosis not present

## 2018-06-26 DIAGNOSIS — R188 Other ascites: Secondary | ICD-10-CM | POA: Diagnosis not present

## 2018-06-27 DIAGNOSIS — I25119 Atherosclerotic heart disease of native coronary artery with unspecified angina pectoris: Secondary | ICD-10-CM | POA: Diagnosis not present

## 2018-06-27 DIAGNOSIS — K7460 Unspecified cirrhosis of liver: Secondary | ICD-10-CM | POA: Diagnosis not present

## 2018-06-27 DIAGNOSIS — I270 Primary pulmonary hypertension: Secondary | ICD-10-CM | POA: Diagnosis not present

## 2018-06-27 DIAGNOSIS — I5020 Unspecified systolic (congestive) heart failure: Secondary | ICD-10-CM | POA: Diagnosis not present

## 2018-06-27 DIAGNOSIS — R188 Other ascites: Secondary | ICD-10-CM | POA: Diagnosis not present

## 2018-06-27 DIAGNOSIS — I429 Cardiomyopathy, unspecified: Secondary | ICD-10-CM | POA: Diagnosis not present

## 2018-06-28 DIAGNOSIS — I5020 Unspecified systolic (congestive) heart failure: Secondary | ICD-10-CM | POA: Diagnosis not present

## 2018-06-28 DIAGNOSIS — K7460 Unspecified cirrhosis of liver: Secondary | ICD-10-CM | POA: Diagnosis not present

## 2018-06-28 DIAGNOSIS — I429 Cardiomyopathy, unspecified: Secondary | ICD-10-CM | POA: Diagnosis not present

## 2018-06-28 DIAGNOSIS — I270 Primary pulmonary hypertension: Secondary | ICD-10-CM | POA: Diagnosis not present

## 2018-06-28 DIAGNOSIS — R188 Other ascites: Secondary | ICD-10-CM | POA: Diagnosis not present

## 2018-06-28 DIAGNOSIS — I25119 Atherosclerotic heart disease of native coronary artery with unspecified angina pectoris: Secondary | ICD-10-CM | POA: Diagnosis not present

## 2018-06-29 DIAGNOSIS — R0689 Other abnormalities of breathing: Secondary | ICD-10-CM | POA: Diagnosis not present

## 2018-06-29 DIAGNOSIS — I499 Cardiac arrhythmia, unspecified: Secondary | ICD-10-CM | POA: Diagnosis not present

## 2018-06-29 DIAGNOSIS — E161 Other hypoglycemia: Secondary | ICD-10-CM | POA: Diagnosis not present

## 2018-06-29 DIAGNOSIS — E162 Hypoglycemia, unspecified: Secondary | ICD-10-CM | POA: Diagnosis not present

## 2018-07-06 ENCOUNTER — Telehealth: Payer: Self-pay | Admitting: Family Medicine

## 2018-07-06 NOTE — Telephone Encounter (Signed)
Sympathy card send

## 2018-07-10 DIAGNOSIS — 419620001 Death: Secondary | SNOMED CT | POA: Diagnosis not present

## 2018-07-10 DEATH — deceased

## 2018-07-11 ENCOUNTER — Ambulatory Visit: Payer: Medicare Other | Admitting: Endocrinology

## 2019-05-21 IMAGING — DX DG CHEST 2V
2 series · 2 of 2 positions shown · non-contrast
Comparison: 07/30/2017

CLINICAL DATA: Productive cough for 1 week, shortness of breath
when coughing, hypertension, diabetes mellitus, former smoker

EXAM:
CHEST  2 VIEW

[chest lat]
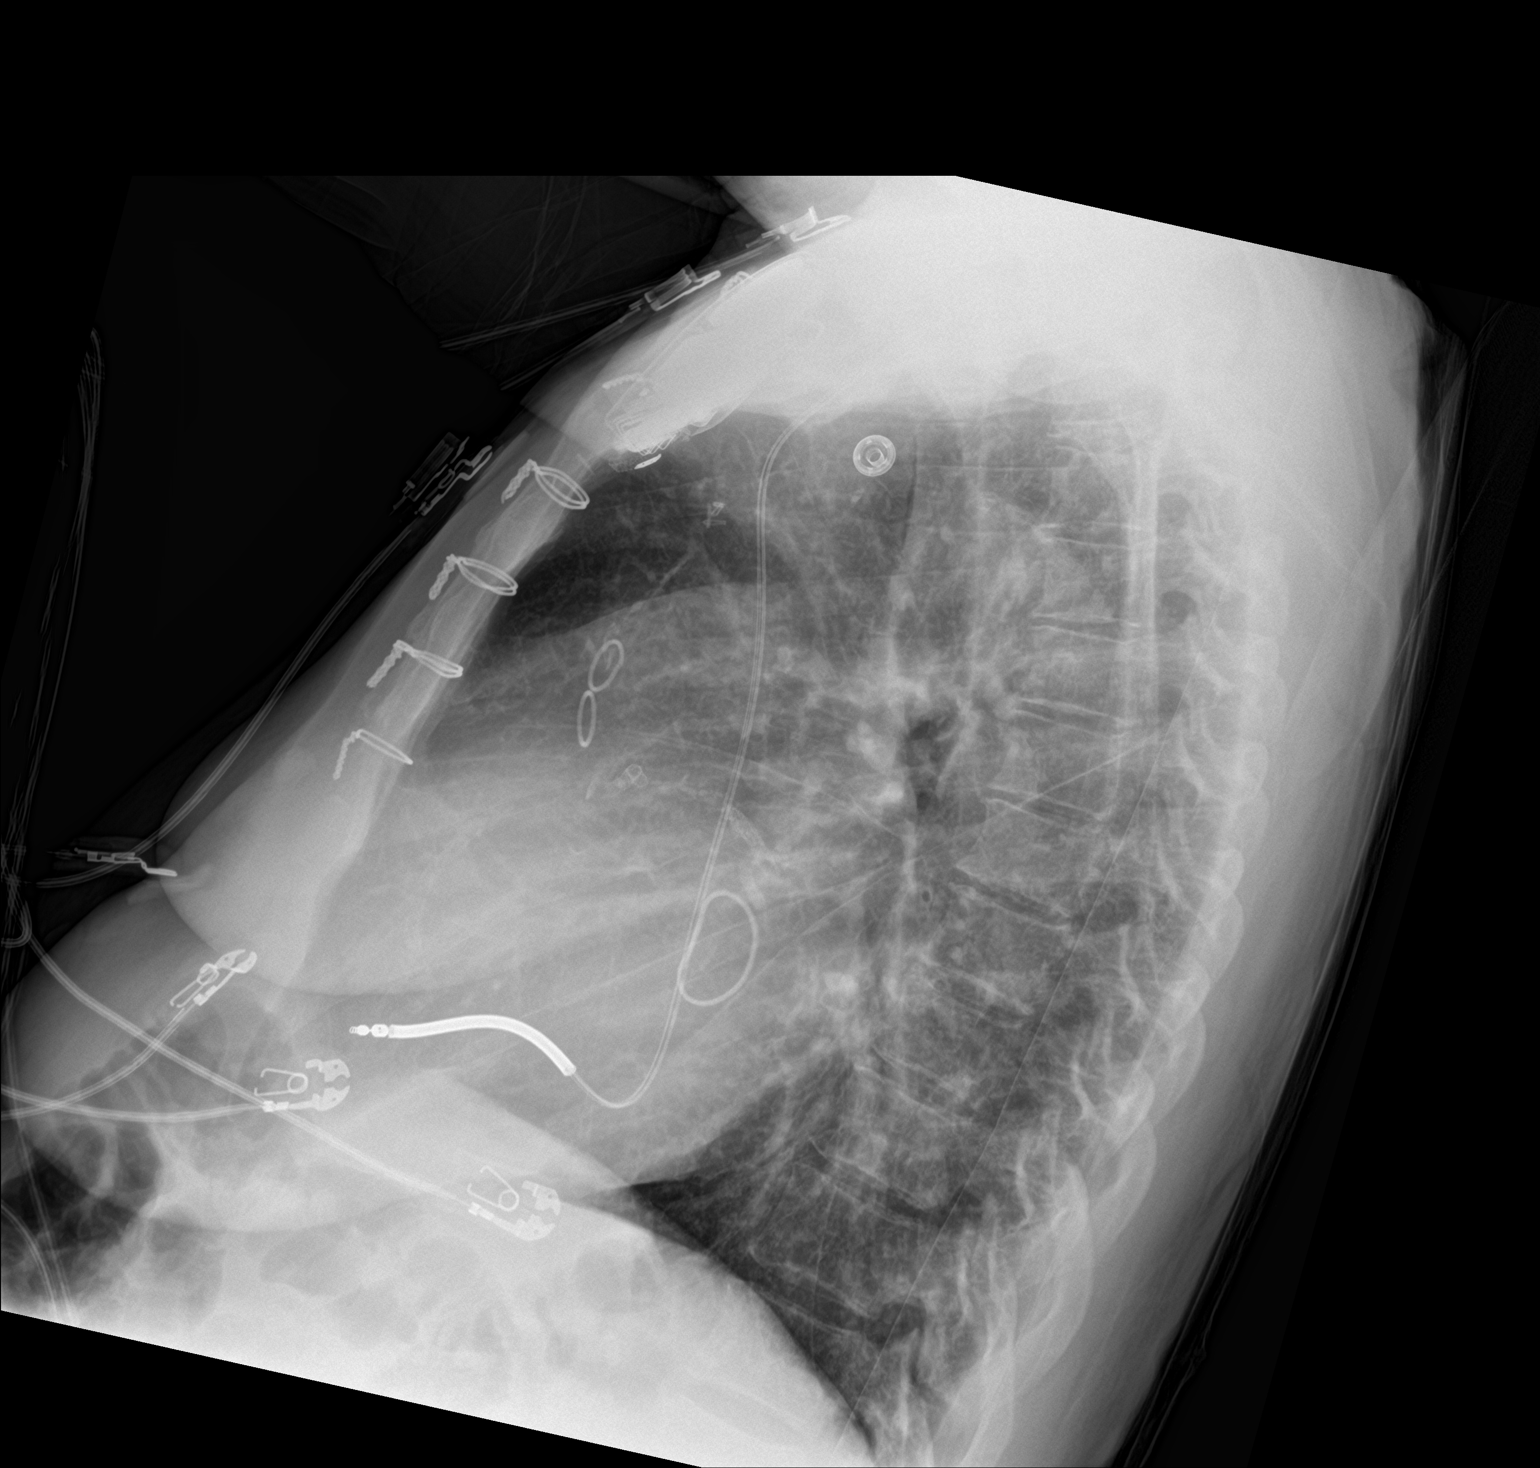

[chest ap]
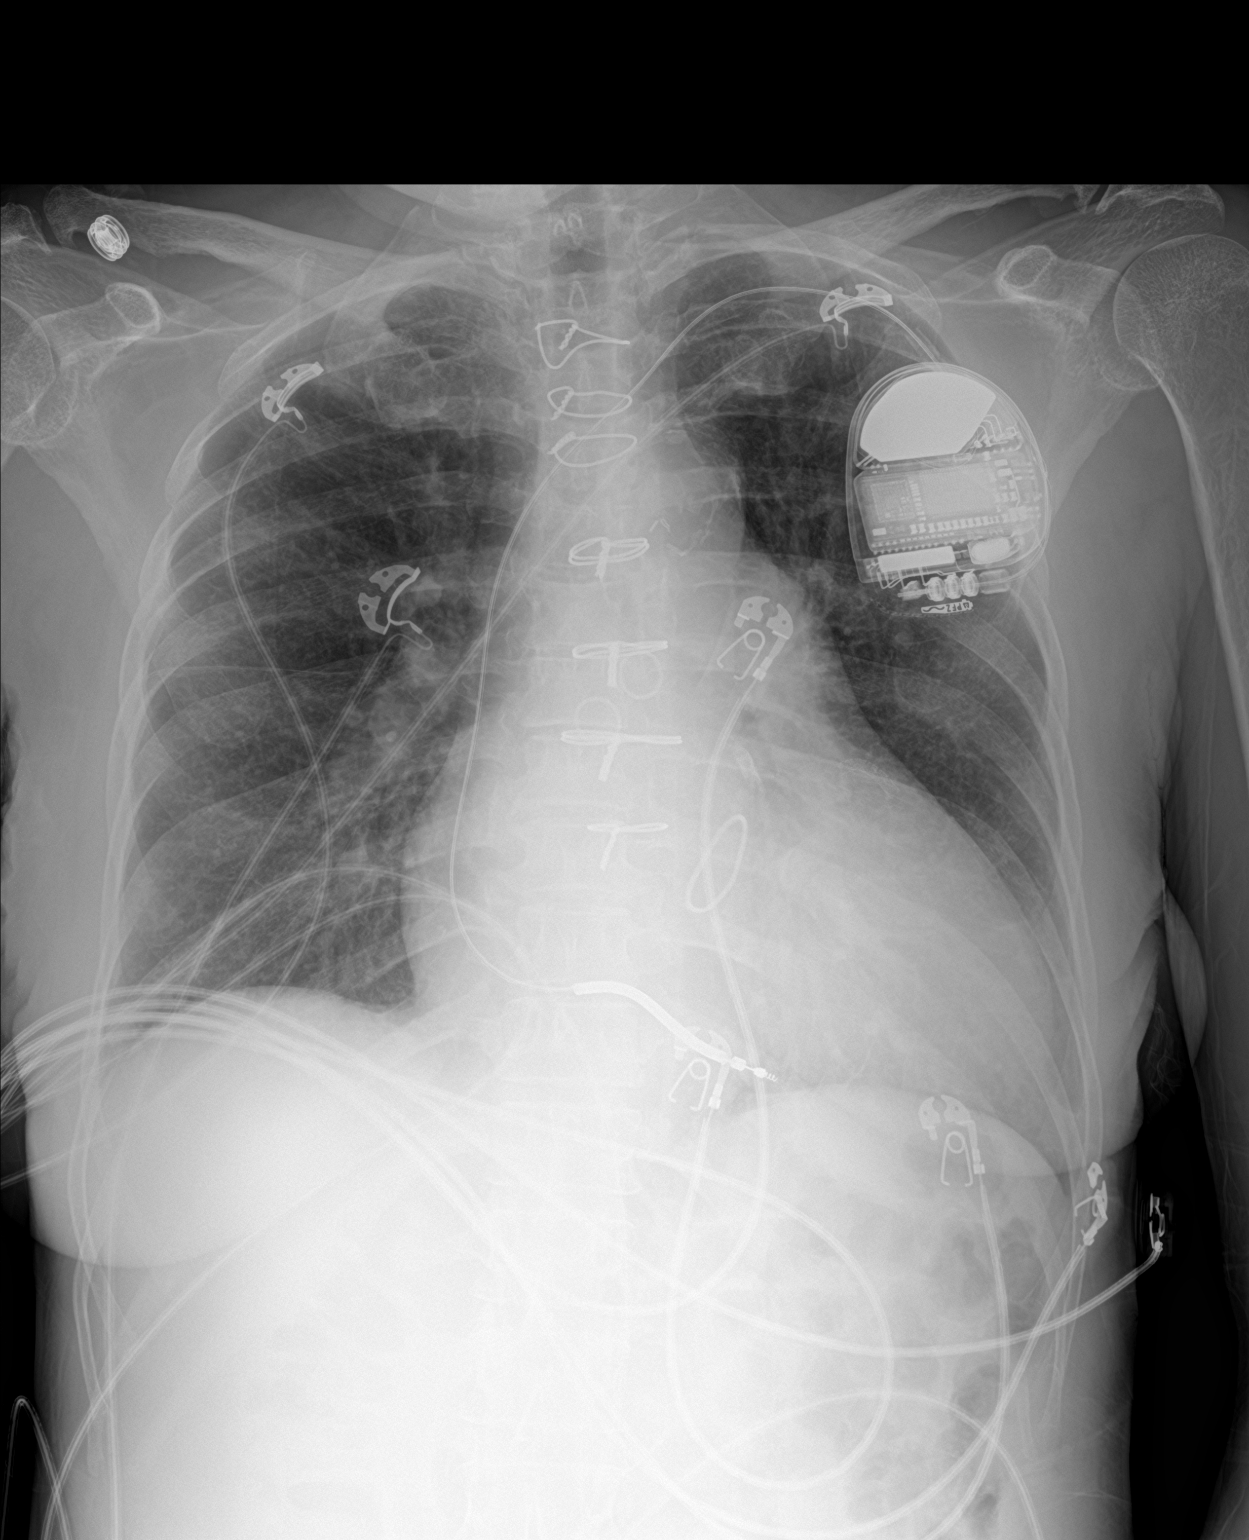

[2 of 2 positions shown; findings below may reference images not displayed]

FINDINGS: LEFT subclavian AICD lead projects at RIGHT ventricle unchanged.

Enlargement of cardiac silhouette post CABG, MVR, and coronary
stenting.

Atherosclerotic calcification aorta.

Mediastinal contours and pulmonary vascularity normal.

Minimal chronic interstitial prominence and underlying emphysematous
changes.

No definite acute infiltrate, pleural effusion or pneumothorax.

Bones unremarkable.
IMPRESSION: Enlargement of cardiac silhouette post AICD, CABG, MVR, and coronary
PTCA.

COPD changes without acute infiltrate.

## 2019-09-26 IMAGING — US US ABDOMEN LIMITED
1 series · 13 of 25 positions shown · non-contrast
Comparison: None.

CLINICAL DATA: Elevated alkaline phosphatase level, ascites.

EXAM:
ULTRASOUND ABDOMEN LIMITED RIGHT UPPER QUADRANT

[Series 1: us abdomen limited · 0.19mm/px · 13 of 81 slices shown]
[im 1/81]
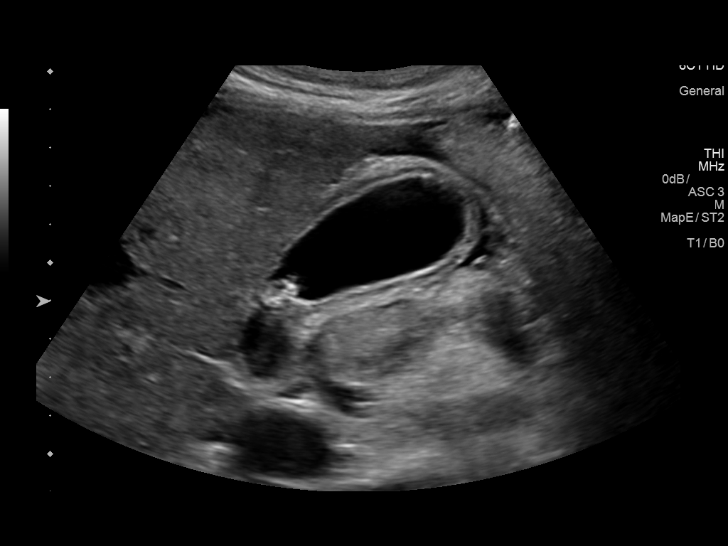
[im 7/81]
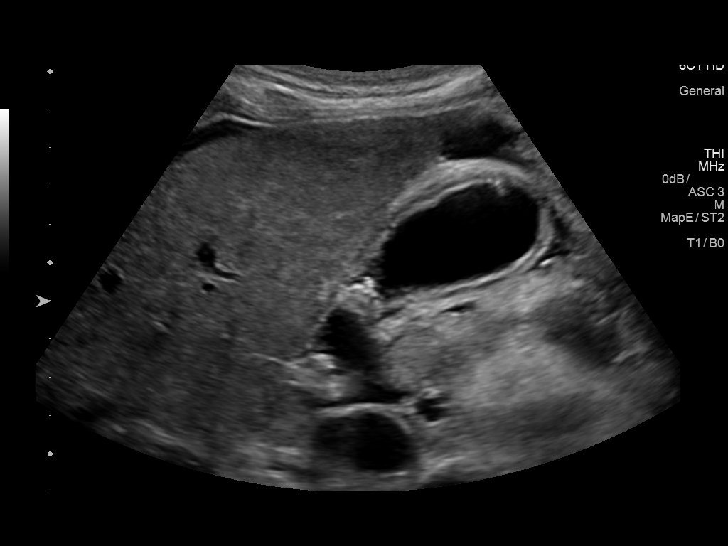
[im 14/81]
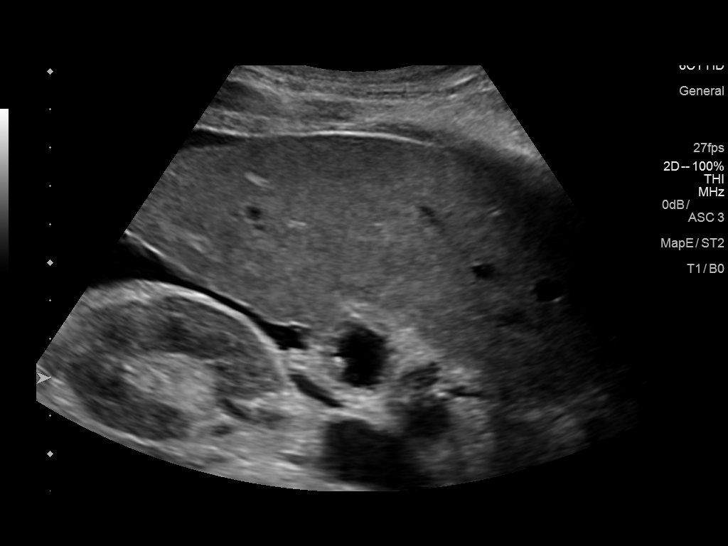
[im 21/81]
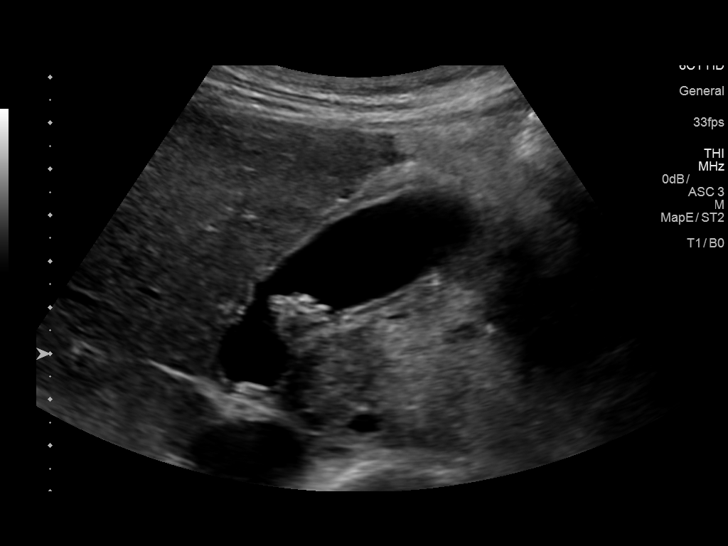
[im 27/81]
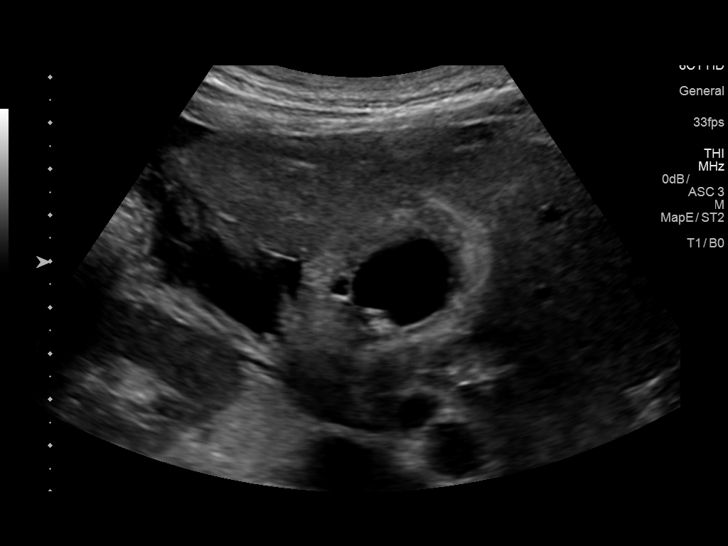
[im 34/81]
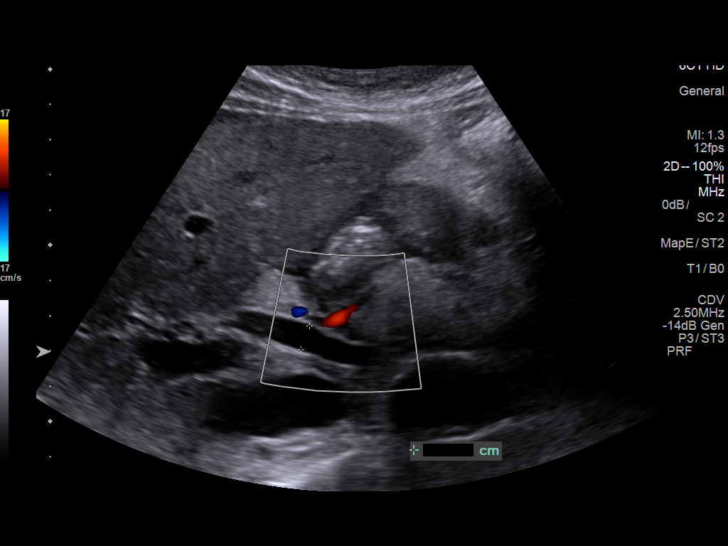
[im 41/81]
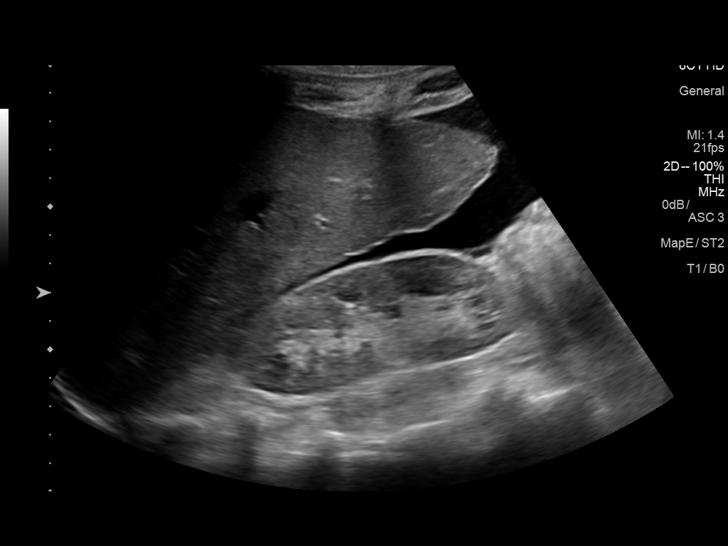
[im 47/81]
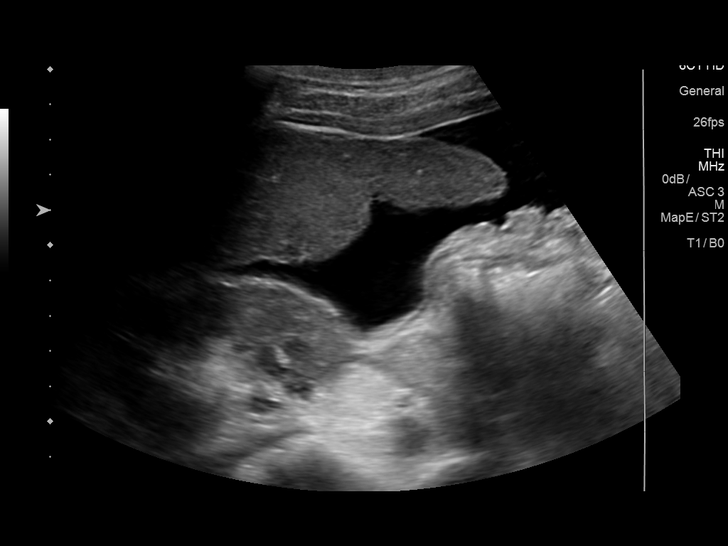
[im 54/81]
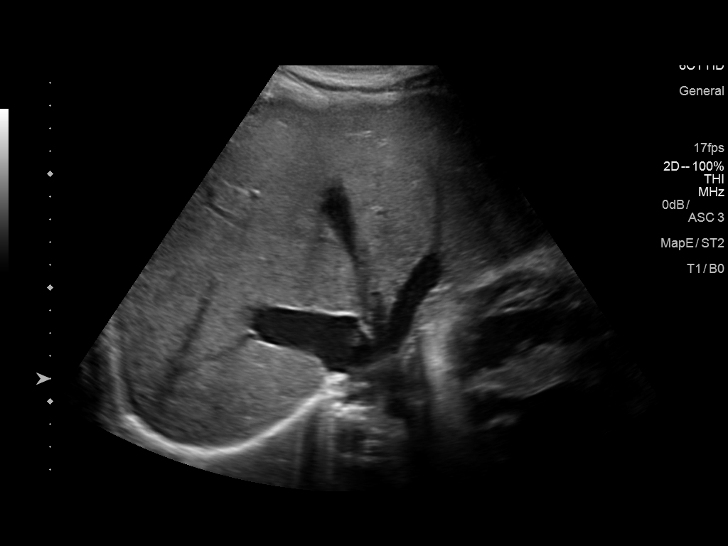
[im 61/81]
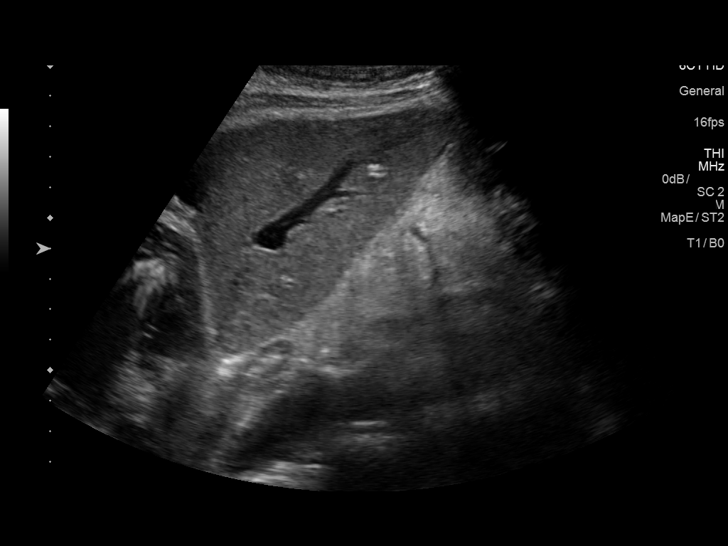
[im 67/81]
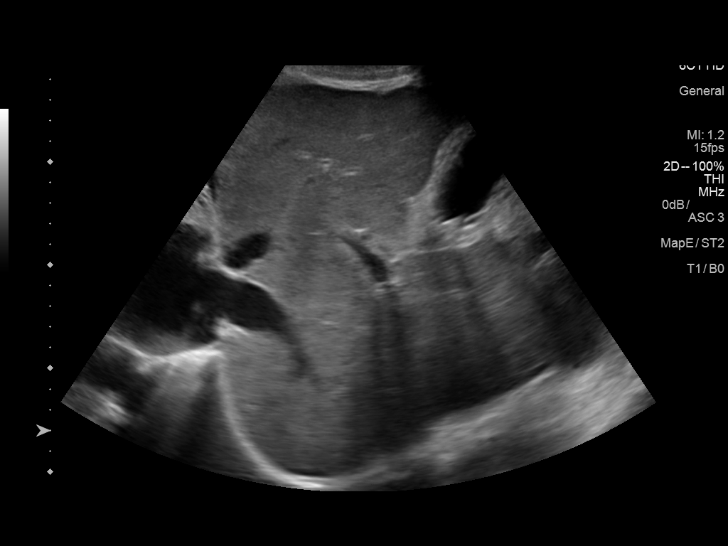
[im 74/81]
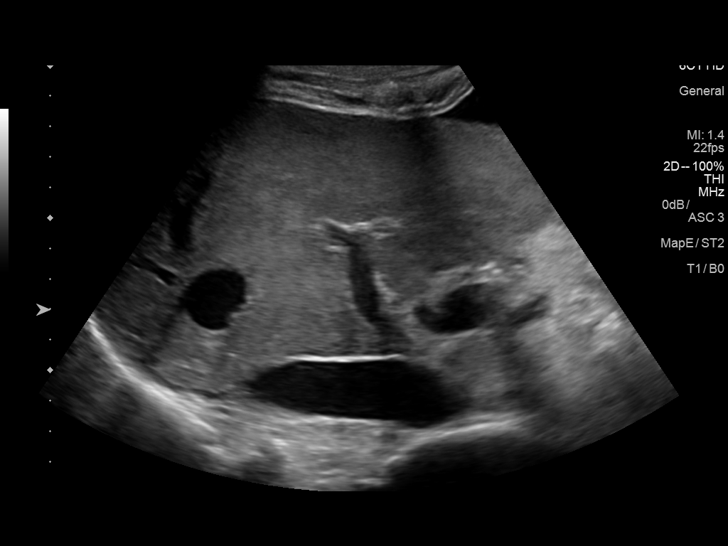
[im 81/81]
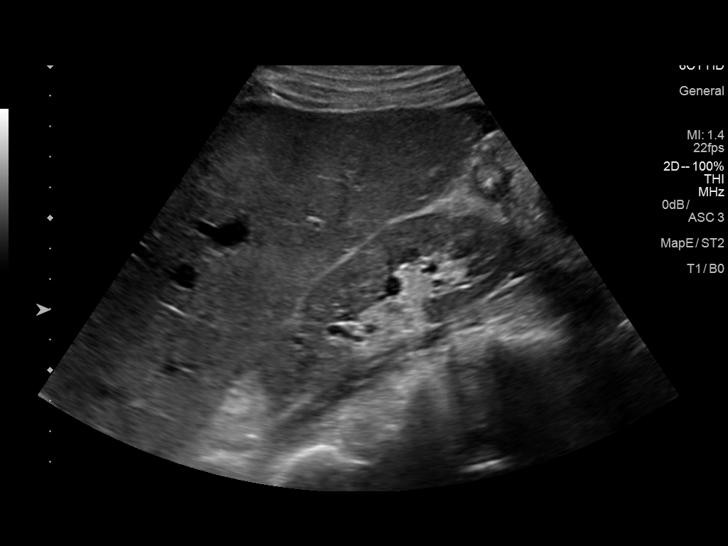

[13 of 25 positions shown; findings below may reference images not displayed]

FINDINGS: Gallbladder:

The gallbladder is adequately distended. There is a 6 mm diameter
non focal nonshadowing focus most compatible with a polyp. There is
a coarse stone measuring 11 mm in diameter. There is gallbladder
wall thickening to 7.5 mm. There are also echogenic foci within the
gallbladder wall with comet tail artifact likely reflecting
adenomyomatosis. There is no positive sonographic Murphy's sign.
There is a moderate amount of ascites around the gallbladder.

Common bile duct:

Diameter: 4.8-7 mm.  No intraluminal stones or sludge are observed.

Liver:

The hepatic echotexture is heterogeneous. The surface contour is
irregular. There is no focal mass nor ductal dilation. A moderate
amount of ascites surrounds the liver. Portal vein is patent on
color Doppler imaging with normal direction of blood flow towards
the liver.
IMPRESSION: Gallstones and polyps. Gallbladder wall thickening. No positive
sonographic Murphy's sign. The findings may likely reflect chronic
cholecystitis and cholelithiasis.

Heterogeneous hepatic echotexture compatible with the pattern
cellular disease, possibly cirrhosis. No suspicious hepatic masses.

Moderate amount of intra-abdominal ascites.

## 2019-09-27 IMAGING — CR DG SHOULDER 2+V*R*
3 series · 3 of 3 positions shown · non-contrast
Comparison: None.

CLINICAL DATA: Onset right shoulder pain when the patient woke up
02/10/2018. No known injury.

EXAM:
RIGHT SHOULDER - 2+ VIEW

[w shoulder external right]
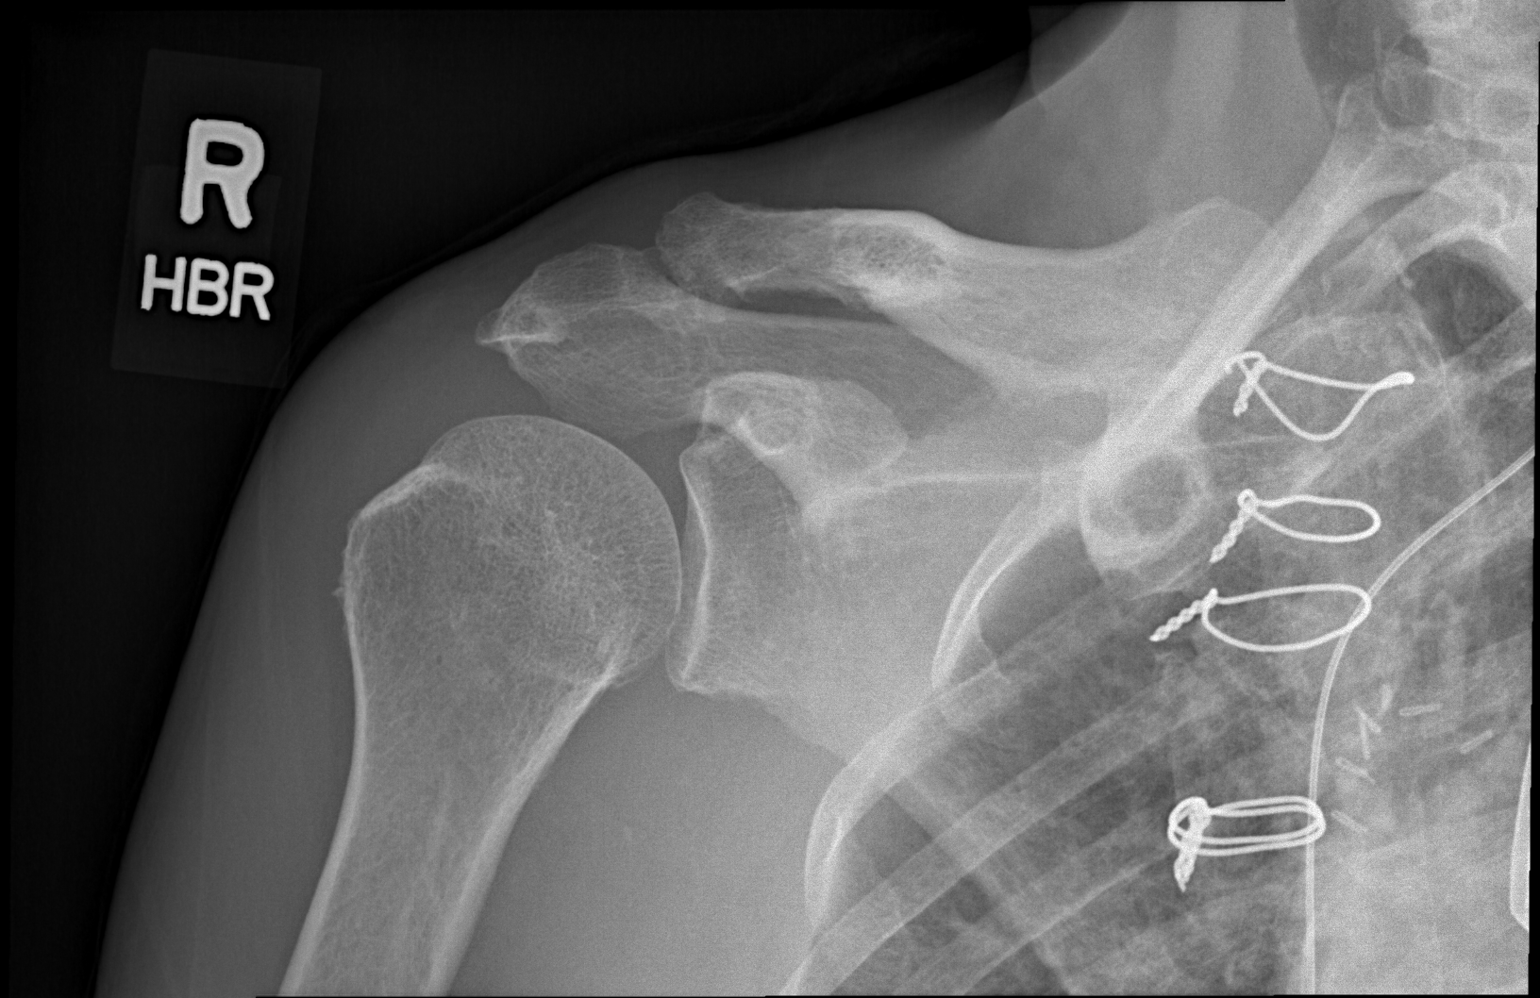

[w shoulder y-view right]
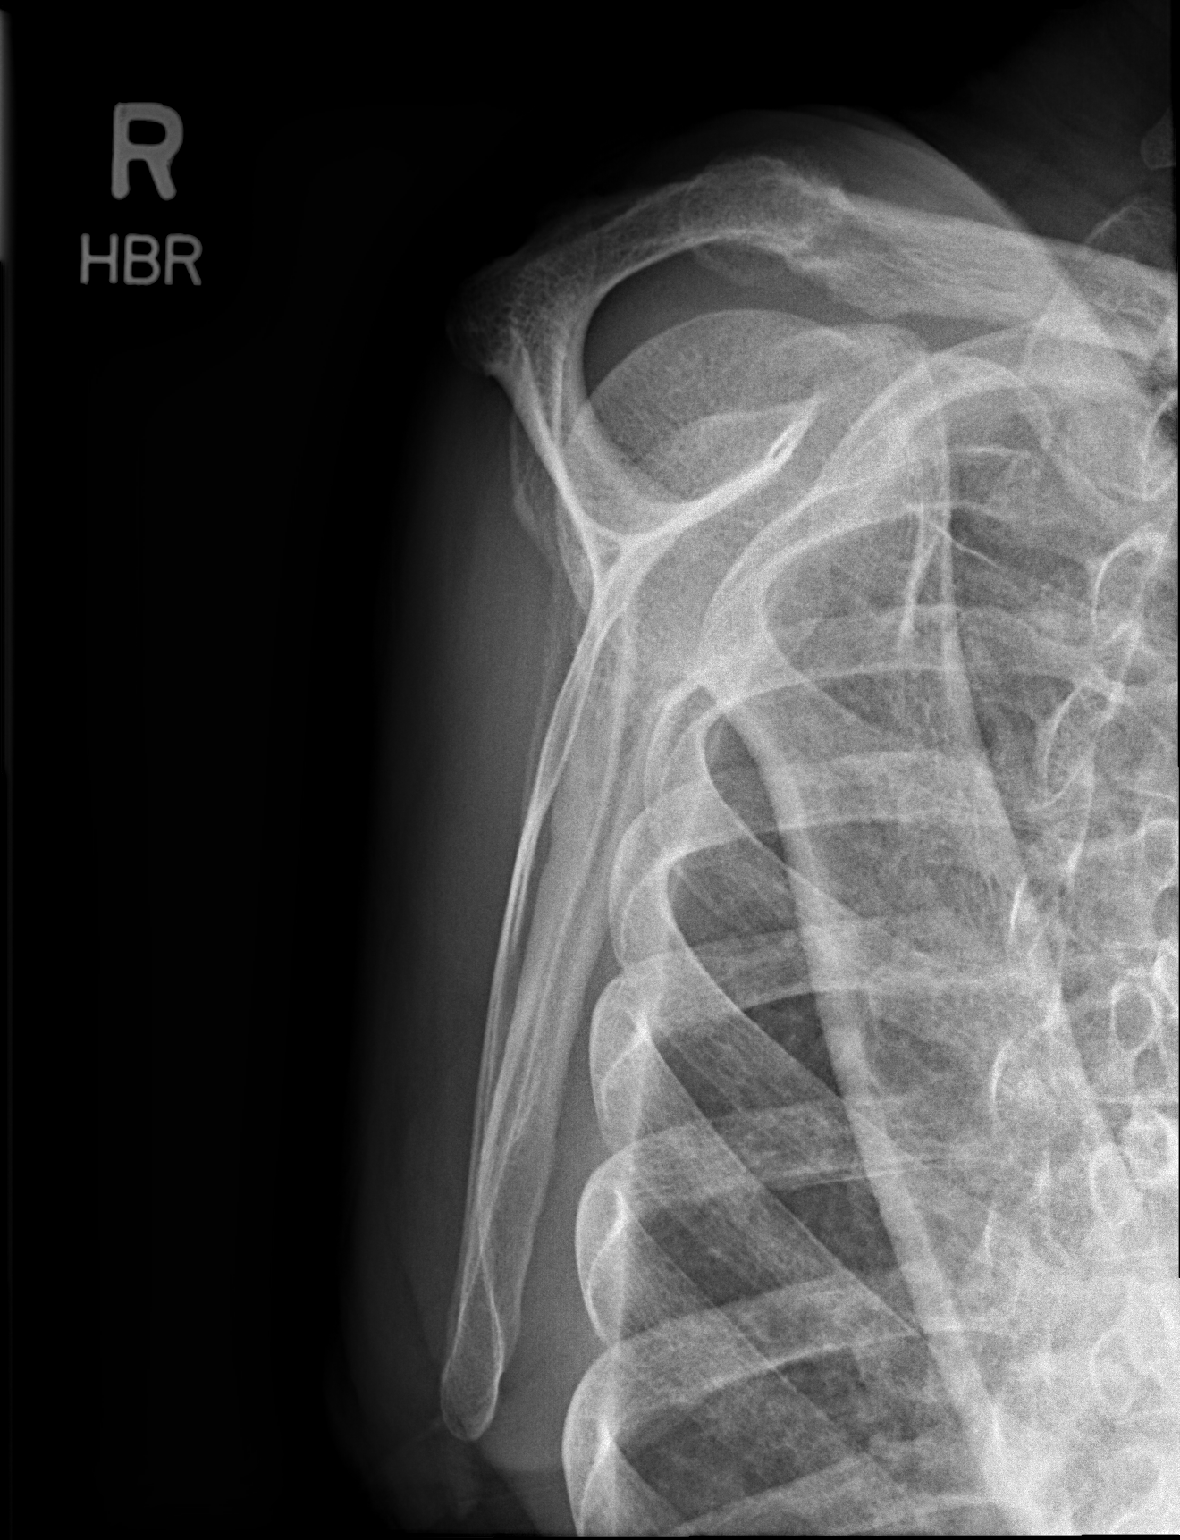

[x shoulder axillary right]
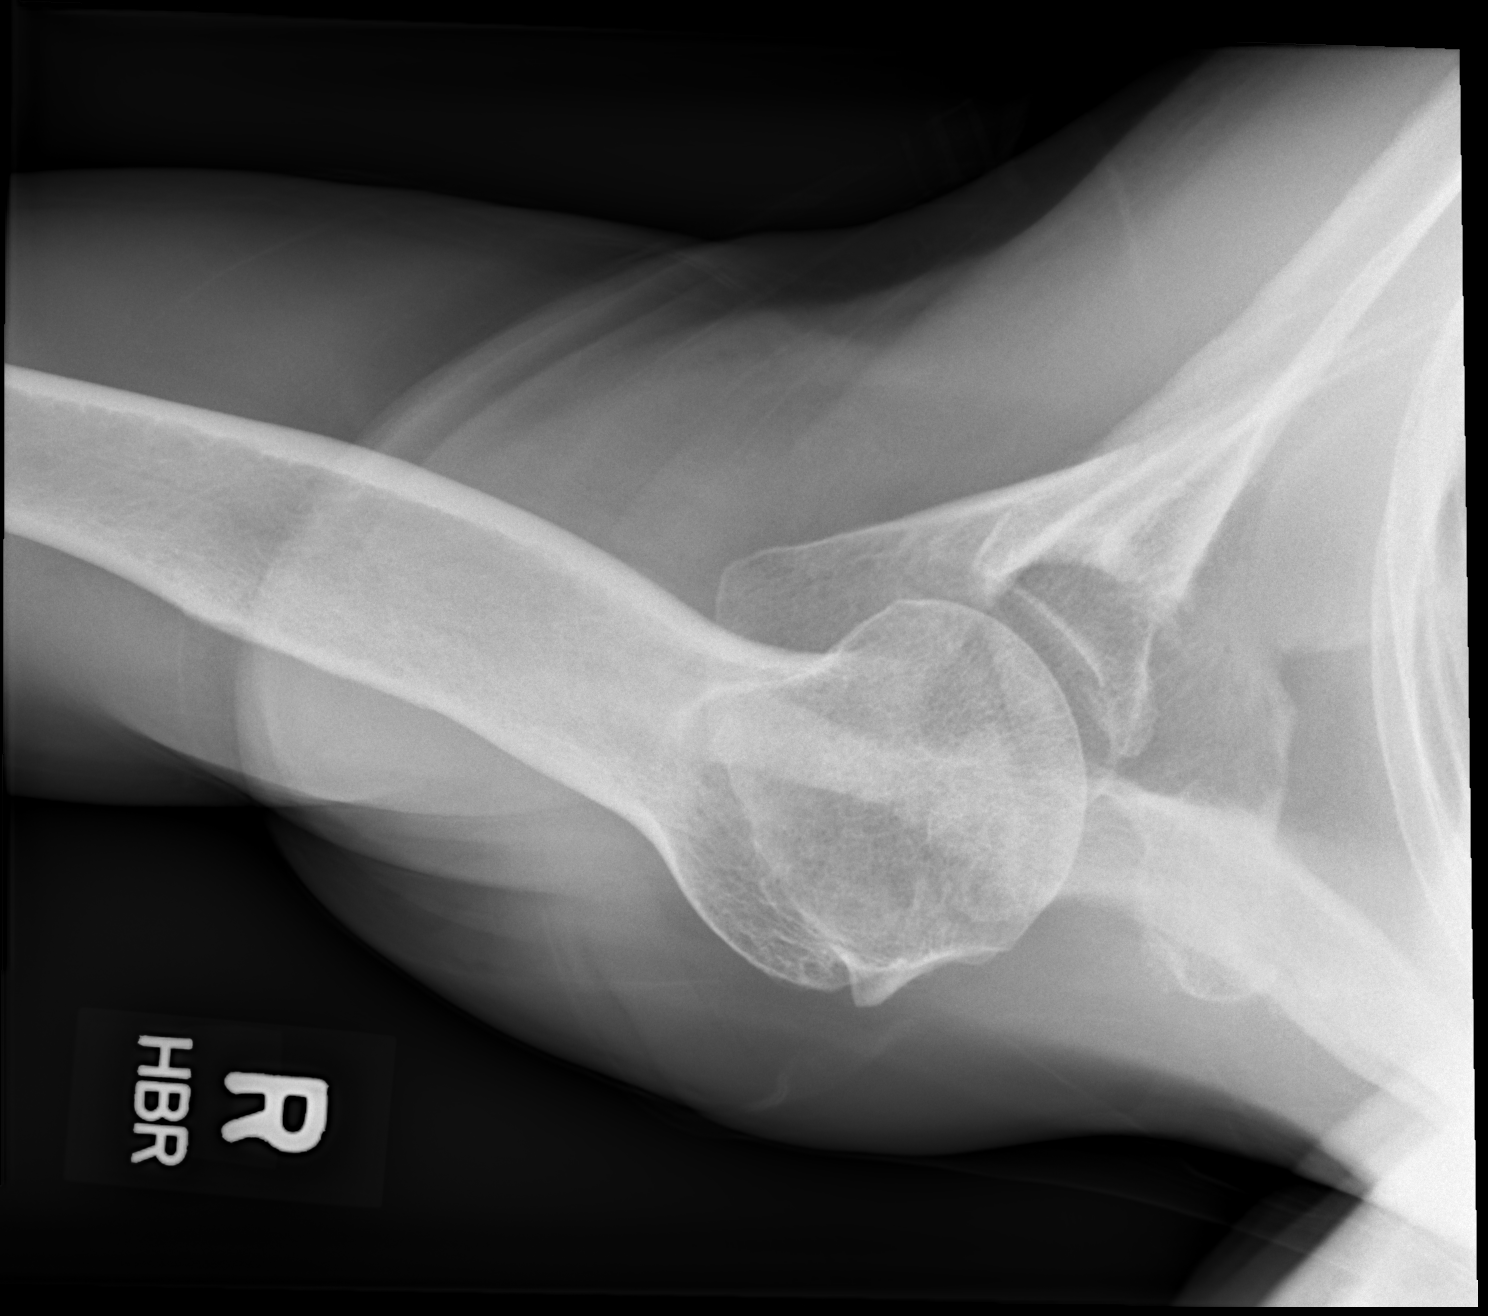

[3 of 3 positions shown; findings below may reference images not displayed]

FINDINGS: There is no acute bony or joint abnormality. Mild to moderate
acromioclavicular osteoarthritis is noted. The glenohumeral joint is
unremarkable. Imaged right lung and ribs appear normal.
IMPRESSION: No acute finding.

Mild to moderate acromioclavicular osteoarthritis.
